# Patient Record
Sex: Male | Born: 1955 | Race: Black or African American | Hispanic: No | Marital: Single | Smoking: Former smoker
Health system: Southern US, Community
[De-identification: ages and names within clinical notes are randomized; demographics above are authoritative.]

## PROBLEM LIST (undated history)

## (undated) DIAGNOSIS — R569 Unspecified convulsions: Secondary | ICD-10-CM

## (undated) DIAGNOSIS — F79 Unspecified intellectual disabilities: Secondary | ICD-10-CM

## (undated) DIAGNOSIS — C801 Malignant (primary) neoplasm, unspecified: Secondary | ICD-10-CM

## (undated) DIAGNOSIS — E7800 Pure hypercholesterolemia, unspecified: Secondary | ICD-10-CM

---

## 1999-11-21 ENCOUNTER — Emergency Department (HOSPITAL_COMMUNITY): Admission: EM | Admit: 1999-11-21 | Discharge: 1999-11-22 | Payer: Self-pay | Admitting: Emergency Medicine

## 1999-11-21 ENCOUNTER — Encounter: Payer: Self-pay | Admitting: Emergency Medicine

## 2001-04-06 ENCOUNTER — Inpatient Hospital Stay (HOSPITAL_COMMUNITY): Admission: EM | Admit: 2001-04-06 | Discharge: 2001-04-09 | Payer: Self-pay | Admitting: *Deleted

## 2001-04-07 ENCOUNTER — Encounter: Payer: Self-pay | Admitting: Internal Medicine

## 2006-07-14 ENCOUNTER — Emergency Department (HOSPITAL_COMMUNITY): Admission: EM | Admit: 2006-07-14 | Discharge: 2006-07-14 | Payer: Self-pay | Admitting: Emergency Medicine

## 2006-07-25 ENCOUNTER — Emergency Department (HOSPITAL_COMMUNITY): Admission: EM | Admit: 2006-07-25 | Discharge: 2006-07-25 | Payer: Self-pay | Admitting: Family Medicine

## 2008-01-10 ENCOUNTER — Ambulatory Visit: Payer: Self-pay | Admitting: Family Medicine

## 2008-01-10 ENCOUNTER — Inpatient Hospital Stay (HOSPITAL_COMMUNITY): Admission: EM | Admit: 2008-01-10 | Discharge: 2008-01-16 | Payer: Self-pay | Admitting: Emergency Medicine

## 2008-01-30 ENCOUNTER — Ambulatory Visit: Payer: Self-pay | Admitting: Family Medicine

## 2008-01-30 DIAGNOSIS — R569 Unspecified convulsions: Secondary | ICD-10-CM | POA: Insufficient documentation

## 2008-01-30 DIAGNOSIS — F70 Mild intellectual disabilities: Secondary | ICD-10-CM | POA: Insufficient documentation

## 2008-01-30 DIAGNOSIS — Z87448 Personal history of other diseases of urinary system: Secondary | ICD-10-CM | POA: Insufficient documentation

## 2008-01-30 DIAGNOSIS — R972 Elevated prostate specific antigen [PSA]: Secondary | ICD-10-CM | POA: Insufficient documentation

## 2009-10-27 ENCOUNTER — Ambulatory Visit: Payer: Self-pay | Admitting: Family Medicine

## 2009-10-27 LAB — CONVERTED CEMR LAB
Glucose, Urine, Semiquant: NEGATIVE
Nitrite: NEGATIVE
Protein, U semiquant: NEGATIVE
WBC Urine, dipstick: NEGATIVE
pH: 8

## 2009-11-16 ENCOUNTER — Ambulatory Visit: Payer: Self-pay | Admitting: Family Medicine

## 2009-11-19 ENCOUNTER — Ambulatory Visit: Payer: Self-pay | Admitting: Family Medicine

## 2009-11-19 ENCOUNTER — Encounter: Payer: Self-pay | Admitting: Family Medicine

## 2009-11-19 LAB — CONVERTED CEMR LAB
ALT: 15 units/L (ref 0–53)
BUN: 15 mg/dL (ref 6–23)
CO2: 28 meq/L (ref 19–32)
Calcium: 9.6 mg/dL (ref 8.4–10.5)
Chloride: 109 meq/L (ref 96–112)
Creatinine, Ser: 0.79 mg/dL (ref 0.40–1.50)
Glucose, Bld: 93 mg/dL (ref 70–99)
HCT: 40.8 % (ref 39.0–52.0)
HDL: 68 mg/dL (ref >39)
Hemoglobin: 13.2 g/dL (ref 13.0–17.0)
LDL Cholesterol: 117 mg/dL — ABNORMAL HIGH (ref 0–99)
MCV: 101.5 fL — ABNORMAL HIGH (ref 78.0–100.0)
PSA: 2.96 ng/mL (ref 0.10–4.00)
RBC: 4.02 M/uL — ABNORMAL LOW (ref 4.22–5.81)
Total Bilirubin: 0.3 mg/dL (ref 0.3–1.2)
Total CHOL/HDL Ratio: 3
VLDL: 16 mg/dL (ref 0–40)
WBC: 5 10*3/uL (ref 4.0–10.5)

## 2009-12-30 ENCOUNTER — Encounter (INDEPENDENT_AMBULATORY_CARE_PROVIDER_SITE_OTHER): Payer: Self-pay | Admitting: *Deleted

## 2010-02-01 ENCOUNTER — Ambulatory Visit
Admission: RE | Admit: 2010-02-01 | Discharge: 2010-02-01 | Payer: Self-pay | Source: Home / Self Care | Attending: Gastroenterology | Admitting: Gastroenterology

## 2010-02-01 ENCOUNTER — Encounter (INDEPENDENT_AMBULATORY_CARE_PROVIDER_SITE_OTHER): Payer: Self-pay | Admitting: *Deleted

## 2010-02-03 ENCOUNTER — Encounter: Payer: Self-pay | Admitting: Family Medicine

## 2010-02-17 NOTE — Assessment & Plan Note (Signed)
Summary: cpe/eo   Vital Signs:  Patient profile:   55 year old male Height:      66 inches Weight:      137.5 pounds BMI:     22.27 Temp:     98.8 degrees F oral Pulse rate:   75 / minute BP sitting:   97 / 62  (left arm) Cuff size:   regular  Vitals Entered By: Garen Grams LPN (November 16, 2009 2:47 PM) CC: cpe Is Patient Diabetic? No Pain Assessment Patient in pain? no        Primary Care Provider:  Bobby Rumpf  MD  CC:  cpe.  History of Present Illness: 1) Elevated PSA: Patient was hospitalized 01/10/08 - 01/16/08 for acute pyelonephritis; received 10 day total course of antibiotics. Of note at that time patient was found to have elevated PSA to 37. Was supposed to follow up with me for recheck but did not do so. Denies dysuria or hematuria, increased frequency or urgency, or hesitancy. Usually wakes 1 - 2 per night to urinate.  Was seen 10/12 for "foul smelling urine" (this occurs with every 5-6 void, with normal UA.   2) Seizure disorder: stable at this time. Dr. Vickey Huger is neurologist. Last seizure was one year ago when ran out of medications. On Dilantin and Depakote as below.   3) Mental retardation: Here with his sister with whom he lives. His MR is mild per family and he is able to dress himself, microwave foods, bathe, feed self. Unable to read or write.   4) PRevention: Due for flu, due for colonoscopy, due for lipids   Habits & Providers  Alcohol-Tobacco-Diet     Tobacco Status: quit     Year Quit: 5 months  Current Medications (verified): 1)  Depakote 500 Mg Tbec (Divalproex Sodium) .Marland KitchenMarland KitchenMarland Kitchen 1500 Mg By Mouth Qday 2)  Dilantin 100 Mg Caps (Phenytoin Sodium Extended) .... 300 Mg By Mouth Qday  Allergies (verified): No Known Drug Allergies  Family History: Reviewed history from 01/30/2008 and no changes required. mother - deceased, 77's, pulmonary embolism father - unknown  2 sisters with HTN, DM (7 siblings total)  Social History: Reviewed history  from 01/30/2008 and no changes required. Lives with sister, brother-in-law and his sister's granddaughter. Smoked 1-2 packs per day since age 66, until 5 months aso, now smokes "a few cigarettes a week". No alcohol or drug use.   Physical Exam  General:  vitals reviewed.  thin.  comfortable appearing.  no acute distress. hard of hearing  Lungs:  Normal respiratory effort, chest expands symmetrically. Lungs are clear to auscultation, no crackles or wheezes. Heart:  Normal rate and regular rhythm. S1 and S2 normal without gallop, murmur, click, rub or other extra sounds. Abdomen:  soft, non-tender, normal bowel sounds, no distention, no masses, no guarding, and no rebound tenderness.     Impression & Recommendations:  Problem # 1:  ELEVATED PROSTATE SPECIFIC ANTIGEN (ICD-790.93) Patient with history of nocturia, occasional foul smelling urine. No other symptoms. Will recheck this week with other labs - Urology referral if elevated.   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 Form for Day Program for Adults to be filled.   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 Follow up with Neurology as per schedule.   His updated medication list for this problem includes:    Depakote  500 Mg Tbec (Divalproex sodium) .Marland KitchenMarland KitchenMarland KitchenMarland Kitchen 1500 mg by mouth qday    Dilantin 100 Mg Caps (Phenytoin sodium extended) .Marland KitchenMarland KitchenMarland KitchenMarland Kitchen 300 mg by mouth qday  Problem # 4:  Preventive Health Care (ICD-V70.0)  Lipids, CMET this week Flu vaccine today Self-referral for colonoscopy.   Orders: [facility] - Est  40-64 yrs ([address])  Complete Medication List: 1)  Depakote 500 Mg Tbec (Divalproex sodium) .Marland KitchenMarland KitchenMarland Kitchen 1500 mg by mouth qday 2)  Dilantin 100 Mg Caps (Phenytoin sodium extended) .... 300 mg by mouth qday  Other Orders: Flu Vaccine 68yrs + (60454) Admin 1st Vaccine (09811)  Patient  Instructions: 1)  Come back in this week to get fasting labs (come in the morning at 8:30 - DO NOT EAT BREAKFAST BEFORE YOU COME IN) 2)  Come back in one year for physical. 3)  Follow up as scheduled with Neurology.  4)  Make sure to get 8 glasses of water each day. 5)  Call the number to have a colonoscopy set up.    Orders Added: 1)  Flu Vaccine 39yrs + [90658] 2)  Admin 1st Vaccine [90471] 3)  [facility] - Est  40-64 yrs [99396]   Immunizations Administered:  Influenza Vaccine # 1:    Vaccine Type: Fluvax 3+    Site: right deltoid    Mfr: GlaxoSmithKline    Dose: 0.5 ml    Route: IM    Given by: Garen Grams LPN    Exp. Date: 07/13/2010    Lot #: [id number]    VIS given: 08/10/09 version given November 16, 2009.  Flu Vaccine Consent Questions:    Do you have a history of severe allergic reactions to this vaccine? no    Any prior history of allergic reactions to egg and/or gelatin? no    Do you have a sensitivity to the preservative Thimersol? no    Do you have a past history of Guillan-Barre Syndrome? no    Do you currently have an acute febrile illness? no    Have you ever had a severe reaction to latex? no    Vaccine information given and explained to patient? yes   Immunizations Administered:  Influenza Vaccine # 1:    Vaccine Type: Fluvax 3+    Site: right deltoid    Mfr: GlaxoSmithKline    Dose: 0.5 ml    Route: IM    Given by: Garen Grams LPN    Exp. Date: 07/13/2010    Lot #: [id number]    VIS given: 08/10/09 version given November 16, 2009.    Appended Document: Orders Update    Clinical Lists Changes  Orders: Added new Test order of Lipid-[facility] [phone]) - Signed Added new Test order of Comp Met-[facility] [phone]) - Signed Added new Test order of PSA-[facility] [phone]) - Signed Added new Test order of CBC-[facility] (25956) - Signed

## 2010-02-17 NOTE — Assessment & Plan Note (Signed)
Summary: NEEDS TO TALK ABOUT GETTING A COLONOSCOPY...LSW.   History of Present Illness Visit Type: Initial Visit Primary GI MD: Sheryn Bison MD FACP [facility] Primary Provider: Bobby Rumpf  MD Requesting Provider: n/a Chief Complaint: Patient here to discuss having a colonoscopy. He denies any current GI symptoms. History of Present Illness:   Pleasant 55 year old African American male who has mild mental retardation and is accompanied by his sister who is his power of attorney. He is followed by Dr. Wallene Huh and primary care, and has a chronic seizure disorder treated with Depakote and Dilantin. He is referred today for consideration of screening colonoscopy.  The patient and his sister deny any gastrointestinal symptomatology. Specifically no melena, hematochezia, abdominal pain, change in bowel movement pattern, dyspepsia, reflux symptoms, dysphagia, history of hepatitis or pancreatitis or any known liver disease. He follows a regular diet without any food intolerances, and denies weight loss or systemic complaints. He has been hospitalized previously with pyelonephritis. Family history is noncontributory. He has never had barium studies or screening endoscopic exams.   GI Review of Systems      Denies abdominal pain, acid reflux, belching, bloating, chest pain, dysphagia with liquids, dysphagia with solids, heartburn, loss of appetite, nausea, vomiting, vomiting blood, weight loss, and  weight gain.        Denies anal fissure, black tarry stools, change in bowel habit, constipation, diarrhea, diverticulosis, fecal incontinence, heme positive stool, hemorrhoids, irritable bowel syndrome, jaundice, light color stool, liver problems, rectal bleeding, and  rectal pain. Preventive Screening-Counseling & Management  Caffeine-Diet-Exercise     Does Patient Exercise: yes    Current Medications (verified): 1)  Depakote 500 Mg Tbec (Divalproex Sodium) .... Take 3 Tablets (1500 Mg) By Mouth Qday 2)   Dilantin 100 Mg Caps (Phenytoin Sodium Extended) .... Take 3 Tablets (300 Mg) By Mouth Qday 3)  Multivitamins  Tabs (Multiple Vitamin) .... Take 1 Tablet By Mouth Once A Day  Allergies (verified): No Known Drug Allergies  Past History:  Past medical, surgical, family and social histories (including risk factors) reviewed for relevance to current acute and chronic problems.  Past Medical History: Reviewed history from 01/30/2008 and no changes required. Seizure disorder - GTC  Mental retardation - mild per sister  hospitalization 12/09 for acute pyelonephritis elevated PSA (37) in 12/09   Past Surgical History: Reviewed history from 01/30/2008 and no changes required. none   Family History: Reviewed history from 01/30/2008 and no changes required. mother - deceased, 23's, pulmonary embolism father - unknown  2 sisters with HTN, DM (7 siblings total) Family History of Breast Cancer: Sister Family History of Diabetes: 2 sisters, 2 neices  Social History: Reviewed history from 01/30/2008 and no changes required. Lives with sister, brother-in-law and his sister's granddaughter. Smoked 1-2 packs per day since age 52, until 5 months aso, now smokes "a few cigarettes a week".  No alcohol or drug use.  Patient gets regular exercise. Does Patient Exercise:  yes  Review of Systems  The patient denies allergy/sinus, anemia, anxiety-new, arthritis/joint pain, back pain, blood in urine, breast changes/lumps, change in vision, confusion, cough, coughing up blood, depression-new, fainting, fatigue, fever, headaches-new, hearing problems, heart murmur, heart rhythm changes, itching, menstrual pain, muscle pains/cramps, night sweats, nosebleeds, pregnancy symptoms, shortness of breath, skin rash, sleeping problems, sore throat, swelling of feet/legs, swollen lymph glands, thirst - excessive , urination - excessive , urination changes/pain, urine leakage, vision changes, and voice change.     Vital Signs:  Patient profile:  55 year old male Height:      66 inches Weight:      135.13 pounds BMI:     21.89 BSA:     1.69 Pulse rate:   68 / minute Pulse rhythm:   regular BP sitting:   112 / 68  (left arm)  Vitals Entered By: Lamona Curl CMA Duncan Dull) (February 01, 2010 9:52 AM)  Physical Exam  General:  Well developed, well nourished, no acute distress.Very thin patient appearing or than his stated age in no acute distress. He is very pleasant, agreeable, and is able to converse without difficulty. Head:  Normocephalic and atraumatic. Eyes:  PERRLA, no icterus.exam deferred to patient's ophthalmologist.   Neck:  Supple; no masses or thyromegaly. Lungs:  Clear throughout to auscultation. Heart:  Regular rate and rhythm; no murmurs, rubs,  or bruits. Abdomen:  Soft, nontender and nondistended. No masses, hepatosplenomegaly or hernias noted. Normal bowel sounds. Rectal:  deferred until time of colonoscopy.   Msk:  Symmetrical with no gross deformities. Normal posture. Pulses:  Normal pulses noted. Extremities:  No clubbing, cyanosis, edema or deformities noted. Neurologic:  Alert and  oriented x4;  grossly normal neurologically. Cervical Nodes:  No significant cervical adenopathy. Psych:  Alert and cooperative. Normal mood and affect.   Impression & Recommendations:  Problem # 1:  SPECIAL SCREENING FOR MALIGNANT NEOPLASMS COLON (ICD-V76.51) Assessment Unchanged I have reviewed colonoscopy with its preparation, risk and benefits, and the patient and his sister have agreed to proceed as planned. We will prep him with a standard balanced electrolyte solution, early morning appointment, and I have suggested strongly to he and his sister that they continue his anti-seizure medication throughout the preparation. I also have reiterated the value of a good colonoscopy preparation in terms of colon cancer screening. 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  Patient Instructions: 1)  Copy sent to : Bobby Rumpf  MD 2)  Your procedure has been scheduled for 03/07/2010, please follow the seperate instructions.  3)  Your prescription(s) have been sent to you pharmacy.  4)  [facility] Patient Information Guide given to patient.  5)  Colonoscopy and Flexible Sigmoidoscopy brochure given.  6)  The medication list was reviewed and reconciled.  All changed / newly prescribed medications were explained.  A complete medication list was provided to the patient / caregiver. Prescriptions: MOVIPREP 100 GM  SOLR (PEG-KCL-NACL-NASULF-NA ASC-C) As per prep instructions.  #1 x 0   Entered by:   Harlow Mares CMA (AAMA)   Authorized by:   Mardella Layman MD [facility]   Signed by:   Harlow Mares CMA (AAMA) on 02/01/2010   Method used:   Electronically to        [facility] [address] [phone]* (retail)       [address]       [place], [place]  [address]       Ph: [phone] or [phone]       Fax: [phone]   RxID:   [id number]

## 2010-02-17 NOTE — Miscellaneous (Signed)
Summary: Physical form  pt dropped off physical form to be completed, gave to Terese Door for any clinical completion. Knox Royalty  February 03, 2010 1:38 PM  Physical form placed in Dr. Jeanice Lim box for completion.Terese Door  February 03, 2010 1:48 PM  Form completed - back to [employer]. Bobby Rumpf  MD  February 03, 2010 1:50 PM    Form faxed to [phone].  [name] Terese Door  February 03, 2010 3:26 PM

## 2010-02-17 NOTE — Assessment & Plan Note (Signed)
Summary: foul smelling urine/carew/bmc   Vital Signs:  Patient profile:   55 year old male Weight:      137.5 pounds Temp:     98.9 degrees F oral Pulse rate:   78 / minute BP sitting:   110 / 69  (left arm) Cuff size:   regular  Vitals Entered By: Garen Grams LPN (October 27, 2009 3:09 PM) CC: foul-smelling urine Is Patient Diabetic? No Pain Assessment Patient in pain? no        CC:  foul-smelling urine.  History of Present Illness: 1. Foul smelling urine.  Pt with MR.  Brought in by his sister who provided the HPI - Has had this for about 6 months - Not getting better or worse but is embarassing - After he urinates the whole side of the house will smell like urine - No new medicines or foods - Drinking plenty of water - It doesn't smell after every void but after every 5th or 6th  ROS: denies fevers, flank pain, dysuria, hematuria, dehydration, or vaginal discharge  PMHx: Hospitalized for pyelonephritis and prostatitis in 01/2009  Habits & Providers  Alcohol-Tobacco-Diet     Tobacco Status: quit     Year Quit: 5 months  Current Medications (verified): 1)  Depakote 500 Mg Tbec (Divalproex Sodium) .Marland KitchenMarland KitchenMarland Kitchen 1500 Mg By Mouth Qday 2)  Dilantin 100 Mg Caps (Phenytoin Sodium Extended) .... 300 Mg By Mouth Qday  Allergies: No Known Drug Allergies  Social History: Reviewed history from 01/30/2008 and no changes required. Lives with sister, brother-in-law and his sister's granddaughter. Smoked 1-2 packs per day since age 55, until 5 months aso, now smokes "a few cigarettes a week". No alcohol or drug use.   Physical Exam  General:  vitals reviewed.  thin.  comfortable appearing.  no acute distress Mouth:  poor dentition.   Lungs:  Normal respiratory effort, chest expands symmetrically. Lungs are clear to auscultation, no crackles or wheezes. Heart:  Normal rate and regular rhythm. S1 and S2 normal without gallop, murmur, click, rub or other extra sounds. Abdomen:   soft, non-tender, normal bowel sounds, no distention, no masses, no guarding, and no rebound tenderness.   Msk:  no flank pain or tenderness Extremities:  no lower extremity edema Neurologic:  alert, hard of hearing but conversant when directly addressed.    Impression & Recommendations:  Problem # 1:  OTHER ABNORMALITY OF URINATION (ZOX-096.04) Assessment New  Likely normal.  No associated symptoms like dysuria, change in color, hematuria.  UA normal.  Advised plenty of by mouth intake to help dilute the urine.  Orders: [facility]- Est Level  3 (540[address])  Complete Medication List: 1)  Depakote 500 Mg Tbec (Divalproex sodium) .Marland KitchenMarland KitchenMarland Kitchen 1500 mg by mouth qday 2)  Dilantin 100 Mg Caps (Phenytoin sodium extended) .... 300 mg by mouth qday  Other Orders: Urinalysis-[facility] (00000)  Patient Instructions: 1)  The urine study was normal, there was no sign of an infection 2)  Drink plenty of water 3)  Please schedule a follow up appointment with Dr. Wallene Huh  Laboratory Results   Urine Tests  Date/Time Received: October 27, 2009 3:15 PM  Date/Time Reported: October 27, 2009 4:10 PM   Routine Urinalysis   Color: yellow Appearance: Clear Glucose: negative   (Normal Range: Negative) Bilirubin: negative   (Normal Range: Negative) Ketone: negative   (Normal Range: Negative) Spec. Gravity: 1.020   (Normal Range: 1.003-1.035) Blood: negative   (Normal Range: Negative) pH: 8.0   (Normal Range:  5.0-8.0) Protein: negative   (Normal Range: Negative) Urobilinogen: 0.2   (Normal Range: 0-1) Nitrite: negative   (Normal Range: Negative) Leukocyte Esterace: negative   (Normal Range: Negative)    Comments: ...........test performed by...........Marland KitchenTerese Door, CMA

## 2010-02-17 NOTE — Letter (Signed)
Summary: New Patient letter  [facility]  [address] [place], [place] [address]   Phone: [phone]  Fax: [phone]       12/30/2009 MRN: [id number]   Jeffery [name] [address] [place], [place]  [address]  Dear [name],  Welcome to the Gastroenterology Division at [facility].    You are scheduled to see Dr.  Jarold Motto on 02/01/10 at 10:00 A.M.  on the 3rd floor at [facility], [address]  We ask that you try to arrive at our office 15 minutes prior to your appointment time to allow for check-in.  We would like you to complete the enclosed self-administered evaluation form prior to your visit and bring it with you on the day of your appointment.  We will review it with you.  Also, please bring a complete list of all your medications or, if you prefer, bring the medication bottles and we will list them.  Please bring your insurance card so that we may make a copy of it.  If your insurance requires a referral to see a specialist, please bring your referral form from your primary care physician.  Co-payments are due at the time of your visit and may be paid by cash, check or credit card.     Your office visit will consist of a consult with your physician (includes a physical exam), any laboratory testing he/she may order, scheduling of any necessary diagnostic testing (e.g. x-ray, ultrasound, CT-scan), and scheduling of a procedure (e.g. Endoscopy, Colonoscopy) if required.  Please allow enough time on your schedule to allow for any/all of these possibilities.    If you cannot keep your appointment, please call [phone] to cancel or reschedule prior to your appointment date.  This allows [place] the opportunity to schedule an appointment for another patient in need of care.  If you do not cancel or reschedule by 5 p.m. the business day prior to your appointment date, you will be charged a $50.00 late cancellation/no-show fee.    Thank you for  choosing  Gastroenterology for your medical needs.  We appreciate the opportunity to care for you.  Please visit [place] at our website  to learn more about our practice.                     Sincerely,                                                             The Gastroenterology Division

## 2010-02-17 NOTE — Letter (Signed)
Summary: [facility] Instructions  [facility]  [address] [place], [place] [address]   Phone: [phone]  Fax: [phone]       Jeffery [name]    30-Mar-1955    MRN: [id number]        Procedure Day /Date: Monday 03/07/2010     Arrival Time: 8am     Procedure Time: 9am     Location of Procedure:                    X  [facility] (4th Floor)                   PREPARATION FOR COLONOSCOPY WITH MOVIPREP   Starting 5 days prior to your procedure 03/02/2010 do not eat nuts, seeds, popcorn, corn, beans, peas,  salads, or any raw vegetables.  Do not take any fiber supplements (e.g. Metamucil, Citrucel, and Benefiber).  THE DAY BEFORE YOUR PROCEDURE        Sunday 03/06/2010  1.  Drink clear liquids the entire day-NO SOLID FOOD  2.  Do not drink anything colored red or purple.  Avoid juices with pulp.  No orange juice.  3.  Drink at least 64 oz. (8 glasses) of fluid/clear liquids during the day to prevent dehydration and help the prep work efficiently.  CLEAR LIQUIDS INCLUDE: Water Jello Ice Popsicles Tea (sugar ok, no milk/cream) Powdered fruit flavored drinks Coffee (sugar ok, no milk/cream) Gatorade Juice: apple, white grape, white cranberry  Lemonade Clear bullion, consomm, broth Carbonated beverages (any kind) Strained chicken noodle soup Hard Candy                             4.  In the morning, mix first dose of MoviPrep solution:    Empty 1 Pouch A and 1 Pouch B into the disposable container    Add lukewarm drinking water to the top line of the container. Mix to dissolve    Refrigerate (mixed solution should be used within 24 hrs)  5.  Begin drinking the prep at 5:00 p.m. The MoviPrep container is divided by 4 marks.   Every 15 minutes drink the solution down to the next mark (approximately 8 oz) until the full liter is complete.   6.  Follow completed prep with 16 oz of clear liquid of your choice (Nothing red or purple).  Continue  to drink clear liquids until bedtime.  7.  Before going to bed, mix second dose of MoviPrep solution:    Empty 1 Pouch A and 1 Pouch B into the disposable container    Add lukewarm drinking water to the top line of the container. Mix to dissolve    Refrigerate  THE DAY OF YOUR PROCEDURE      Monday 03/07/2010  Beginning at 4:00am (5 hours before procedure):         1. Every 15 minutes, drink the solution down to the next mark (approx 8 oz) until the full liter is complete.  2. Follow completed prep with 16 oz. of clear liquid of your choice.    3. You may drink clear liquids until 7am (2 HOURS BEFORE PROCEDURE).   MEDICATION INSTRUCTIONS   Remember to take ALL medication the morning of your procedure.         OTHER INSTRUCTIONS  You will need a responsible adult at least 57 years of age to accompany you and drive you  home.   This person must remain in the waiting room during your procedure.  Wear loose fitting clothing that is easily removed.  Leave jewelry and other valuables at home.  However, you may wish to bring a book to read or  an iPod/MP3 player to listen to music as you wait for your procedure to start.  Remove all body piercing jewelry and leave at home.  Total time from sign-in until discharge is approximately 2-3 hours.  You should go home directly after your procedure and rest.  You can resume normal activities the  day after your procedure.  The day of your procedure you should not:   Drive   Make legal decisions   Operate machinery   Drink alcohol   Return to work  You will receive specific instructions about eating, activities and medications before you leave.    The above instructions have been reviewed and explained to me by   _______________________    I fully understand and can verbalize these instructions _____________________________ Date _________

## 2010-03-07 ENCOUNTER — Encounter (INDEPENDENT_AMBULATORY_CARE_PROVIDER_SITE_OTHER): Payer: Self-pay | Admitting: *Deleted

## 2010-03-07 ENCOUNTER — Other Ambulatory Visit: Payer: Self-pay | Admitting: Gastroenterology

## 2010-03-07 ENCOUNTER — Encounter: Payer: Self-pay | Admitting: Gastroenterology

## 2010-03-07 ENCOUNTER — Other Ambulatory Visit: Payer: Medicaid Other

## 2010-03-07 ENCOUNTER — Other Ambulatory Visit (AMBULATORY_SURGERY_CENTER): Payer: Medicaid Other | Admitting: Gastroenterology

## 2010-03-07 DIAGNOSIS — Z1211 Encounter for screening for malignant neoplasm of colon: Secondary | ICD-10-CM

## 2010-03-07 DIAGNOSIS — D126 Benign neoplasm of colon, unspecified: Secondary | ICD-10-CM

## 2010-03-07 DIAGNOSIS — R972 Elevated prostate specific antigen [PSA]: Secondary | ICD-10-CM

## 2010-03-07 LAB — PSA: PSA: 4.55 ng/mL — ABNORMAL HIGH (ref 0.10–4.00)

## 2010-03-11 ENCOUNTER — Encounter: Payer: Self-pay | Admitting: Gastroenterology

## 2010-03-15 NOTE — Letter (Addendum)
Summary: Patient Notice- Polyp Results  [facility]  [address] [place], [place] [address]   Phone: [phone]  Fax: [phone]        March 11, 2010 MRN: [id number]    Jeffery [name] [address] [place], [place]  [address]    Dear [name],  I am pleased to inform you that the colon polyp(s) removed during your recent colonoscopy was (were) found to be benign (no cancer detected) upon pathologic examination.  I recommend you have a repeat colonoscopy examination in 5_ years to look for recurrent polyps, as having colon polyps increases your risk for having recurrent polyps or even colon cancer in the future.  Should you develop new or worsening symptoms of abdominal pain, bowel habit changes or bleeding from the rectum or bowels, please schedule an evaluation with either your primary care physician or with me.  Additional information/recommendations:  __ No further action with gastroenterology is needed at this time. Please      follow-up with your primary care physician for your other healthcare      needs.  __ Please call [phone] to schedule a return visit to review your      situation.  __ Please keep your follow-up visit as already scheduled.  x__ Continue treatment plan as outlined the day of your exam.  Please call us if you are having persistent problems or have questions about your condition that have not been fully answered at this time.  Sincerely,  Mardella Layman MD [facility]  This letter has been electronically signed by your physician.  Appended Document: Patient Notice- Polyp Results letter mailed

## 2010-03-15 NOTE — Procedures (Addendum)
Summary: Colonoscopy  Patient: Jeffery [name] Note: All result statuses are Final unless otherwise noted.  Tests: (1) Colonoscopy (COL)   COL Colonoscopy           DONE     [facility]     [address]     [place], [place]  [address]           COLONOSCOPY PROCEDURE REPORT           PATIENT:  [name], [name]  MR#:  [id number]     BIRTHDATE:  1955/11/28, 55 yrs. old  GENDER:  male     ENDOSCOPIST:  Vania Rea. Jarold Motto, MD, [facility]     REF. BY:     PROCEDURE DATE:  03/07/2010     PROCEDURE:  Colonoscopy with snare polypectomy     ASA CLASS:  Class II     INDICATIONS:  Routine Risk Screening     MEDICATIONS:   Fentanyl 50 mcg IV, Versed 5 mg IV           DESCRIPTION OF PROCEDURE:   After the risks benefits and     alternatives of the procedure were thoroughly explained, informed     consent was obtained.  Digital rectal exam was performed and     revealed no abnormalities and prostate nodule.  IRREGULAR ENLARGED     AND HARD PROSTATE. The LB CF-H180AL [id number] endoscope was     introduced through the anus and advanced to the cecum, which was     identified by both the appendix and ileocecal valve, without     limitations.  The quality of the prep was excellent, using     MoviPrep.  The instrument was then slowly withdrawn as the colon     was fully examined.     <<PROCEDUREIMAGES>>           FINDINGS:  A sessile polyp was found in the cecum. 7 MM FLAT POLYP     CECAL POLYP SNARE EXCISED WITH CAUTERY.  This was otherwise a     normal examination of the colon.   Retroflexed views in the rectum     revealed no abnormalities.    The scope was then withdrawn from     the patient and the procedure completed.           COMPLICATIONS:  None     ENDOSCOPIC IMPRESSION:     1) Sessile polyp in the cecum     2) Otherwise normal examination     PROSTATE ABNORMAL ALSO.     RECOMMENDATIONS:     1) Repeat colonoscopy in 5 years.     WE ORDERED PSA AND UROLOGY REFERRAL.     REPEAT EXAM:   No           ______________________________     Vania Rea. Jarold Motto, MD, Clementeen Graham           CC:  Bertrum Sol, MDKhary Wallene Huh, MD           n.     Rosalie DoctorMarland Kitchen   Vania Rea. Mariachristina Holle at 03/07/2010 10:01 AM           Weyman Pedro, [id number]  Note: An exclamation mark (!) indicates a result that was not dispersed into the flowsheet. Document Creation Date: 03/07/2010 10:01 AM _______________________________________________________________________  (1) Order result status: Final Collection or observation date-time: 03/07/2010 09:37 Requested date-time:  Receipt date-time:  Reported date-time:  Referring Physician:   Ordering Physician: Sheryn Bison [phone]) Specimen  Source:  Source: Launa Grill Order Number: [id number] Lab site:   Appended Document: Colonoscopy 5 year followup colonoscopy  Appended Document: Colonoscopy     Procedures Next Due Date:    Colonoscopy: 02/2015

## 2010-03-15 NOTE — Miscellaneous (Signed)
Summary: Orders Update  Clinical Lists Changes  Orders: Added new Test order of TLB-PSA (Prostate Specific Antigen) (84153-PSA) - Signed  Appended Document: Orders Update    Clinical Lists Changes  Orders: Added new Test order of [facility] - Signed

## 2010-03-16 ENCOUNTER — Telehealth: Payer: Self-pay | Admitting: Gastroenterology

## 2010-03-24 NOTE — Progress Notes (Signed)
Summary: fyi  Phone Note From Other Clinic Call back at [phone]   Caller: Dr. Dennison Nancy Kimbrough's office Call For: Dr Jarold Motto Summary of Call: Dr Valda Lamb office called and wanted to let Dr Jarold Motto know that he did not show up for his appt and did not reschedule. Initial call taken by: Tawni Levy,  March 16, 2010 12:18 PM  Follow-up for Phone Call        Left a message on patients machine to call back if he has questions but he did not show for his [facility] appt and he needs to call their office to reschedule.  Follow-up by: Harlow Mares CMA Duncan Dull),  March 16, 2010 12:54 PM

## 2010-05-31 NOTE — H&P (Signed)
NAME:  Jeffery [name], ERVINE NO.:  [id number]   MEDICAL RECORD NO.:  [id number]          PATIENT TYPE:  INP   LOCATION:  5528                         FACILITY:  [facility]   PHYSICIAN:  Leighton Roach McDiarmid, M.D.DATE OF BIRTH:  August 31, 1955   DATE OF ADMISSION:  01/10/2008  DATE OF DISCHARGE:                              HISTORY & PHYSICAL   PRIMARY CARE PHYSICIAN:  None, he is unassigned.   NEUROLOGIST:  Melvyn Novas, MD   CHIEF COMPLAINT:  Not acting normally per family.   HISTORY OF PRESENT ILLNESS:  This is a 55 year old African American male  with history of mild mental retardation and seizure disorder that was  brought to [facility] ED by his sister because he was not acting  normally.  She reports him to be more tired, decreased appetite,  confused, increased urinary frequency.  This started last night.  Denies  any recent illness.  He typically was able to perform ADLs.  No  complaints of dysuria or hematuria.  No documented fevers.  No recent  hospitalizations or nursing facilities.  No recent antibiotics.   ED stay, he is status post Tylenol 1 g p.o. x1, Rocephin 1 g IV x1,  normal saline 500 mL bolus x1.   PAST MEDICAL HISTORY:  1. Seizure disorder.  2. Mental retardation.   PAST SURGICAL HISTORY:  None.   MEDICATIONS:  1. Depakote 500 mg 3 tablets p.o. nightly.  2. Dilantin 100 mg 3 capsules p.o. nightly.  3. Multivitamin p.o. daily.   ALLERGIES:  No known drug allergies.   FAMILY HISTORY:  Sister has cancer.   SOCIAL HISTORY:  He lives with sister, Pollie Meyer and brother-in-law, and  great niece.  No alcohol or drug abuse.  Smokes tobacco occasionally.  He is not employed and has Medicaid.   REVIEW OF SYSTEMS:  No fevers, chills, or weight loss.  No chest pain,  shortness of breath, abdominal pain, nausea, vomiting, or diarrhea.  No  rashes.  No unilateral weakness or speech difficulties.   PHYSICAL EXAMINATION:  VITAL SIGNS:  T-max was 103.5 at  1716, T-current  is 101.1, pulse was 100s-110s, blood pressure 100-110/60s-70s,  respiratory rate 18, 95% on room air.  GENERAL:  He is non-ill appearing, difficulty hearing with no hearing  aid in place, thin African American male with no apparent distress.  HEENT:  Extraocular movements intact.  Pupils equal, round, and reactive  to light and accommodation.  No signs of trauma.  Moist mucous  membranes.  Dentures in place.  No lymphadenopathy.  CARDIOVASCULAR:  Regular rate and rhythm.  No murmurs, rubs, or gallops.  RESPIRATORY:  Clear to auscultation bilaterally.  No increased work of  breathing.  ABDOMEN:  Soft, nontender, nondistended.  No hepatosplenomegaly.  Positive bowel sounds.  No CVA tenderness.  EXTREMITIES:  No edema.  NEURO:  Cranial nerves II through XII grossly intact.  No facial droop.  No focal deficits.  Normal sensation.  SKIN:  No lesions, no rashes.  Positive dry skin.  PSYCH:  Alert and oriented to person and place, but not time,  no  distress.   LABORATORY DATA:  White blood cell count was 19.3, hemoglobin 13.3 and  platelets 170, 83% neutrophils.  Urinalysis showed small hemoglobin,  positive nitrites, moderate leuko esterase, 7-10 white blood cells, 0-2  red blood cells with many bacteria.  Dilantin level was 23.8, Depakote  was 54.3.  Sodium was 134, potassium 4, chloride 100, bicarb 22, BUN 18,  creatinine 1.07, glucose 131.  LFTs were all within normal limits.  Chest x-ray was normal.  Urine culture is pending and blood culture x2  is also pending.   ASSESSMENT AND PLAN:  This is a 55 year old male with acute urosepsis,  1. Urosepsis.  This is acute onset with confirmatory urinalysis and      white blood cell in history.  He is status post Rocephin 1 g IV x1.      We will plan to continue with Rocephin IV per pharmacy until urine      culture and no antibiotic therapy.  We are awaiting blood cultures      as well.  We will check a CBC and a PSA in the  morning concerning      that he may have some degree of obstruction per prostate that could      have caused his urinary tract infection.  We will work this up as      an outpatient.  We will continue to hydrate and treat fevers      accordingly with Tylenol.  2. Seizure disorder.  He is on good control at home with his home      medications.  No changes at this time, but we will continue with      medications.  3. Prophylaxis.  We will place him on heparin 5000 units subcu t.i.d.  4. Fluids, electrolytes, nutrition.  He will be on strict I's and O's,      regular diet, normal saline at 125 mL per hour and check a BMET in      the morning.  5. Disposition.  I will admit him to the [facility].  He is full code.  We are awaiting      cultures at this time.  His discharge will be pending his clinical      status.      Marisue Ivan, MD  Electronically Signed      Leighton Roach McDiarmid, M.D.  Electronically Signed    KL/MEDQ  D:  01/10/2008  T:  01/11/2008  Job:  [id number]

## 2010-05-31 NOTE — Discharge Summary (Signed)
NAME:  Jeffery [name], Jeffery [name] NO.:  [id number]   MEDICAL RECORD NO.:  [id number]          PATIENT TYPE:  INP   LOCATION:  5528                         FACILITY:  [facility]   PHYSICIAN:  Santiago Bumpers. Hensel, M.D.DATE OF BIRTH:  1955-07-11   DATE OF ADMISSION:  01/10/2008  DATE OF DISCHARGE:  01/16/2008                               DISCHARGE SUMMARY   DISCHARGE ATTENDING:  Chrissie Noa A. Hensel, MD   DISCHARGE DIAGNOSES:  1. Acute pyelonephritis.  2. Prostatitis with elevated prostate-specific antigen.  3. Seizure disorder.   DISCHARGE MEDICATIONS:  1. Ciprofloxacin 500 mg p.o. b.i.d. x8 days.  2. Depakote 1500 mg p.o. nightly.  3. Dilantin 300 mg p.o. nightly.  4. Multivitamin daily.   DISCONTINUED MEDICATIONS:  None.   CONSULTANTS:  None.   LABORATORY DATA:  White blood cells on admission 19.3, hemoglobin 13.3,  hematocrit 40.6, platelets 170, 83% neutrophils.  Electrolytes within  normal limits except for sodium of 134 and glucose of 131, total  bilirubin 1.4, albumin 3.2.  AST, ALT, alkaline phosphatase, total  protein within normal limits.  Phenytoin level slightly elevated at  23.8.  Valproic acid level within normal limits at 54.3.  UA notable for  small bilirubin, 15 ketones, small blood, 30 protein, positive nitrites,  moderate leukocyte esterase, many bacteria.  Urine culture notable for  1:100,000 colonies of Providencia sensitive to ceftriaxone,  ciprofloxacin, gentamicin, levofloxacin, tobramycin, trimethoprim-  sulfamethoxazole.  Blood cultures were negative x5 days on January 10, 2008 as well as January 13, 2008.  PSA was 37.08.  Discharge labs were  as follows, white blood cell 10.88, hemoglobin 11.7, hematocrit 35.3,  platelets 208.  Electrolytes within normal limits.   DIAGNOSTIC STUDIES:  Chest x-ray on January 10, 2008, no active  disease.  CT abdomen on January 15, 2008 showed potentially minimal  scarring of left kidney, lower pole.  Scar  versus small cyst in right  kidney upper pole.  No abscess or pyelonephritis currently apparent.  Mild bibasilar subsegmental atelectasis.  Contracted gallbladder likely  due to recent meal.  Paucity of intra-abdominal adipose tissue resulting  in obscuration of boundaries between adjacent structures in the abdomen.  Mild lumbar spondylosis and degenerative disk disease.  CT pelvis on  January 15, 2008 showed mild prominence of right side of prostate  gland.  Small pelvic lymph nodes are present, but not pathologically  enlarged.   BRIEF HOSPITAL COURSE:  This is a 55 year old male with past medical  history significant for mental retardation, seizure disorder who  presented with a complicated pyelonephritis, prostatitis.  1. Pyelonephritis.  The patient was initially started on ceftriaxone      and ampicillin transitioned to Cipro.  The patient continued to      spike fevers and abdominal and pelvic ultrasound.  Pelvic CT was      obtained to rule out abscess of which there was none.  The      patient's fever curve gradually improved and the patient was      afebrile on day of discharge times more than 24 hours.  The patient  did not have any complaints of dysuria or hematuria during the      course of his hospitalization.  2. Prostatitis with elevated PSA.  The patient's PSA elevated to 37 as      described above in lab section.  The patient with findings as      described above on pelvic CT.  The patient was started on Cipro for      a total of a 10-day course.  This issue should be followed up in      the outpatient setting with a followup PSA upon resolution of the      patient's symptoms.  Please follow this issue in the primary care      setting.  3. Seizure disorder.  The patient was without seizures during the      course of his hospitalization.  The patient's home medications were      continued.  This problem was stable for the patient.   DISCHARGE INSTRUCTIONS:  The  patient was given instructions to follow up  with Dr. Wallene Huh at [facility] on January 28, 2008  at 1:30 p.m., otherwise, no restrictions were made regarding the  patient.  Please followup on the patient's PSA in the outpatient  setting.   DISPOSITION:  The patient was discharged to home in stable and improved  condition.      Bobby Rumpf, MD  Electronically Signed      Santiago Bumpers. Leveda Anna, M.D.  Electronically Signed    KC/MEDQ  D:  01/20/2008  T:  01/20/2008  Job:  [id number]
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
## 2010-06-03 NOTE — Consult Note (Signed)
[place]  Patient:    Jeffery [name], Jeffery [name] Visit Number: [id number] MRN: [id number]          Service Type: MED Location: 3000 3033 01 Attending Physician:  Olene Craven Dictated by:   Melvyn Novas, M.D. Proc. Date: 04/06/01 Admit Date:  04/06/2001   CC:         Kern Reap, M.D.   Consultation Report  REASON FOR CONSULTATION:  Recurrent seizures.  HISTORY OF PRESENT ILLNESS:  The patient is a 55 year old African American male with a history of a seizure disorder and developmental delay.  He supposedly has had seizures since the early 1980s of unknown origin and was treated with Dilantin since.  The patient has been seen by Dr. Barbee Shropshire in the office and Dr. Barbee Shropshire noticed rapidly changing Dilantin blood levels.  Last night, a relative of the patients called the doctor twice to report that he had two generalized tonic-clonic seizures, then he had another one early in the morning.  When the patient arrived at [facility], he had two more generalized tonic-clonic events that were witnessed in the ER by house staff and nurses. The patient lives with his sister, who was there at the time of admission but is no longer available.  The patient received intravenous medication and remains somnolent at this point.  He is on telemetry.  PAST MEDICAL HISTORY:  Known is only a seizure disorder of unknown origin since the patient was in his mid-20s and a developmental delay of unknown origin and possible learning disability.  The chart does not mention hypertension, diabetes mellitus, hyperlipidemia, history of strokes, history of surgeries, history of psychiatric illnesses.  CURRENT MEDICATIONS:  All known is that the patient takes 300 mg of phenytoin twice a day.  ALLERGIES:  No known drug allergies.  PHYSICAL EXAMINATION:  GENERAL:  The patient has no facial or head injuries.  HEENT:  Eyes, ears, and nose are clear.  Nasopharynx is clear to  obstruction. There is no tongue bite.  The neck is supple.  The pupils react equal to light.  CARDIOVASCULAR:  Regular rate and rhythm in the heart.  No murmur.  No neck vein distention.  No carotid bruits.  No lip cyanosis.  All peripheral pulses are also present.  There is no edema, clubbing, cyanosis, or bruises.  ABDOMEN:  Soft, nontender, nondistended.  The patient is not obese.  EXTREMITIES:  All have a full range of motion spontaneously and to noxious stimuli and a normal muscle tone.  VITAL SIGNS:  The patient has a temperature of 105 degrees Fahrenheit, blood pressure 102/78, heart rate of 103, respiratory rate of 18 and shallow on nasal O2 at 2 L.  He saturates to 98%.  NEUROLOGIC:  Mental status:  The patient is obtunded, opens eyes to loud noise and does focus.  Cranial nerve gaze on moving objects four seconds, which is conjugate.  He can accommodate.  Pupils react equal to light.  Extraocular movements seem intact.  No evidence of visual field deficit.  No nystagmus, no gaze preference.  No facial weakness.  Retina is within normal limits.  There is no papilledema, ______, or effusion.  Motor examination:  No signs of myoclonus.  Regular tone, facial weakness.  All four extremities move to noxious stimuli spontaneously and equally.  He is able to lift his head and his hands off the bed.  He can sit without assistance.  Deep tendon reflexes are 1+ equally.  There is no Babinski.  Gait and stance were deferred due to the patients drowsiness.  Coordination is not possible to be tested due to the patients drowsiness.  Sensory to the noxious stimuli only.  LABORATORY DATA:  CT is within normal limits.  The patient shows no evidence of acute stroke or bleed.  Chest x-ray is within normal limits.  Chart review: The only chart available is a previous ER sheet from Dr. ______ over the last two years, which mentioned admissions for previously seizure disorder.  He had mentioned  a history of alcohol abuse until probably at least 10 years ago, which might have originated the seizure disorder at that time.  It also mentioned that the patient lives with his sister on disability.  MEDICATIONS:  The patient received 2 mg of Ativan IV in the ER and a 1 g Dilantin IV load.  ASSESSMENT: 1. Seizure disorder of unknown origin for 20 years and a history of alcohol    abuse.  The seizures are recurrent, intractable, and occur up to twice    monthly according to primary care records on phenytoin medication. 2. Subtherapeutic Dilantin level today, question of compliance, question of    adequate medication for the type of seizure. 3. The patient has an elevated temperature which might be postictal but we    have to rule out an infectious origin.  The patient has no signs of    meningism or encephalitis.  PLAN: 1. Electroencephalogram for evaluation of epilepsy 24 hours after the last    benzodiazepine dose.  We will continue intravenous medication in the form    of Dilantin and titration. 2. Blood cultures and urine cultures are pending to rule out an infection.    The patient has an elevated white blood cell count.  We will also rule out    a cardiac event. 3. ______ enzyme panel and electrocardiogram. 4. The patient was unable to take all the medication in the emergency room    today and will therefore receive intravenous medication phenytoin.  ADDENDUM:  Lab reports available now show the following:  White blood cell count 17.4, H&H 12.12/38.7, platelets 108.  Sodium 144, potassium 3.9, chloride 108, CO2 19, BUN 10, creatinine 1.2, glucose 105.  Calcium 8.6, magnesium 1.8.  Phenytoin level only 2.6!  I appreciate Dr. Thedore Mins consultation and will follow the patient over the weekend and the next week in the hospital if necessary, also after this discharge. Dictated by:   Melvyn Novas, M.D. Attending Physician:  Olene Craven DD:  04/06/01 TD:   04/08/01 Job: [id number]  [id number]

## 2010-10-20 LAB — CBC
HCT: 34.4 % — ABNORMAL LOW (ref 39.0–52.0)
HCT: 35.3 % — ABNORMAL LOW (ref 39.0–52.0)
HCT: 36.5 % — ABNORMAL LOW (ref 39.0–52.0)
Hemoglobin: 11.2 g/dL — ABNORMAL LOW (ref 13.0–17.0)
Hemoglobin: 11.5 g/dL — ABNORMAL LOW (ref 13.0–17.0)
Hemoglobin: 11.7 g/dL — ABNORMAL LOW (ref 13.0–17.0)
Hemoglobin: 12 g/dL — ABNORMAL LOW (ref 13.0–17.0)
Hemoglobin: 13.3 g/dL (ref 13.0–17.0)
MCHC: 32.8 g/dL (ref 30.0–36.0)
MCHC: 32.9 g/dL (ref 30.0–36.0)
MCV: 97.9 fL (ref 78.0–100.0)
MCV: 98 fL (ref 78.0–100.0)
MCV: 99 fL (ref 78.0–100.0)
Platelets: 208 10*3/uL (ref 150–400)
RBC: 3.47 MIL/uL — ABNORMAL LOW (ref 4.22–5.81)
RBC: 3.53 MIL/uL — ABNORMAL LOW (ref 4.22–5.81)
RBC: 3.69 MIL/uL — ABNORMAL LOW (ref 4.22–5.81)
RBC: 4.14 MIL/uL — ABNORMAL LOW (ref 4.22–5.81)
RDW: 13.8 % (ref 11.5–15.5)
RDW: 13.9 % (ref 11.5–15.5)
RDW: 14.2 % (ref 11.5–15.5)
WBC: 10 10*3/uL (ref 4.0–10.5)
WBC: 11 10*3/uL — ABNORMAL HIGH (ref 4.0–10.5)
WBC: 19.3 10*3/uL — ABNORMAL HIGH (ref 4.0–10.5)

## 2010-10-20 LAB — BASIC METABOLIC PANEL
BUN: 11 mg/dL (ref 6–23)
Calcium: 8 mg/dL — ABNORMAL LOW (ref 8.4–10.5)
Calcium: 8 mg/dL — ABNORMAL LOW (ref 8.4–10.5)
Chloride: 105 mEq/L (ref 96–112)
Creatinine, Ser: 0.95 mg/dL (ref 0.4–1.5)
GFR calc Af Amer: >60 mL/min (ref >60)
GFR calc Af Amer: >60 mL/min (ref >60)
GFR calc non Af Amer: >60 mL/min (ref >60)
GFR calc non Af Amer: >60 mL/min (ref >60)
GFR calc non Af Amer: >60 mL/min (ref >60)
Glucose, Bld: 98 mg/dL (ref 70–99)
Potassium: 3.6 mEq/L (ref 3.5–5.1)
Potassium: 4.1 mEq/L (ref 3.5–5.1)
Sodium: 139 mEq/L (ref 135–145)
Sodium: 139 mEq/L (ref 135–145)
Sodium: 139 mEq/L (ref 135–145)

## 2010-10-20 LAB — DIFFERENTIAL
Basophils Absolute: 0 10*3/uL (ref 0.0–0.1)
Basophils Absolute: 0 10*3/uL (ref 0.0–0.1)
Basophils Relative: 0 % (ref 0–1)
Eosinophils Absolute: 0 10*3/uL (ref 0.0–0.7)
Eosinophils Relative: 0 % (ref 0–5)
Lymphocytes Relative: 9 % — ABNORMAL LOW (ref 12–46)
Monocytes Absolute: 1.3 10*3/uL — ABNORMAL HIGH (ref 0.1–1.0)
Monocytes Relative: 9 % (ref 3–12)
Neutro Abs: 12.1 10*3/uL — ABNORMAL HIGH (ref 1.7–7.7)
Neutro Abs: 16 10*3/uL — ABNORMAL HIGH (ref 1.7–7.7)
Neutrophils Relative %: 82 % — ABNORMAL HIGH (ref 43–77)
Neutrophils Relative %: 83 % — ABNORMAL HIGH (ref 43–77)

## 2010-10-20 LAB — URINALYSIS, ROUTINE W REFLEX MICROSCOPIC
Ketones, ur: 15 mg/dL — AB
Protein, ur: 30 mg/dL — AB
Urobilinogen, UA: 1 mg/dL (ref 0.0–1.0)

## 2010-10-20 LAB — COMPREHENSIVE METABOLIC PANEL
Albumin: 3.2 g/dL — ABNORMAL LOW (ref 3.5–5.2)
Alkaline Phosphatase: 94 U/L (ref 39–117)
BUN: 18 mg/dL (ref 6–23)
Creatinine, Ser: 1.07 mg/dL (ref 0.4–1.5)
Glucose, Bld: 131 mg/dL — ABNORMAL HIGH (ref 70–99)
Potassium: 4 mEq/L (ref 3.5–5.1)
Total Bilirubin: 1.4 mg/dL — ABNORMAL HIGH (ref 0.3–1.2)
Total Protein: 6.4 g/dL (ref 6.0–8.3)

## 2010-10-20 LAB — CULTURE, BLOOD (ROUTINE X 2)
Culture: NO GROWTH
Culture: NO GROWTH

## 2010-10-20 LAB — URINE CULTURE: Colony Count: >=100000

## 2010-10-20 LAB — PHENYTOIN LEVEL, TOTAL: Phenytoin Lvl: 23.8 ug/mL — ABNORMAL HIGH (ref 10.0–20.0)

## 2011-01-20 DIAGNOSIS — R82998 Other abnormal findings in urine: Secondary | ICD-10-CM | POA: Diagnosis not present

## 2011-01-20 DIAGNOSIS — R972 Elevated prostate specific antigen [PSA]: Secondary | ICD-10-CM | POA: Diagnosis not present

## 2011-02-22 DIAGNOSIS — R82998 Other abnormal findings in urine: Secondary | ICD-10-CM | POA: Diagnosis not present

## 2011-02-22 DIAGNOSIS — D4959 Neoplasm of unspecified behavior of other genitourinary organ: Secondary | ICD-10-CM | POA: Diagnosis not present

## 2011-02-22 DIAGNOSIS — N390 Urinary tract infection, site not specified: Secondary | ICD-10-CM | POA: Diagnosis not present

## 2011-02-22 DIAGNOSIS — R972 Elevated prostate specific antigen [PSA]: Secondary | ICD-10-CM | POA: Diagnosis not present

## 2011-02-27 DIAGNOSIS — R82998 Other abnormal findings in urine: Secondary | ICD-10-CM | POA: Diagnosis not present

## 2011-02-27 DIAGNOSIS — R972 Elevated prostate specific antigen [PSA]: Secondary | ICD-10-CM | POA: Diagnosis not present

## 2011-03-08 ENCOUNTER — Other Ambulatory Visit: Payer: Self-pay | Admitting: Urology

## 2011-03-16 ENCOUNTER — Encounter (HOSPITAL_COMMUNITY): Payer: Self-pay | Admitting: Pharmacy Technician

## 2011-03-20 ENCOUNTER — Encounter (HOSPITAL_COMMUNITY): Payer: Self-pay

## 2011-03-20 ENCOUNTER — Other Ambulatory Visit: Payer: Self-pay

## 2011-03-20 ENCOUNTER — Encounter (HOSPITAL_COMMUNITY)
Admission: RE | Admit: 2011-03-20 | Discharge: 2011-03-20 | Disposition: A | Discharge status: Home / Self Care | Payer: Medicare Other | Source: Ambulatory Visit | Attending: Urology | Admitting: Urology

## 2011-03-20 DIAGNOSIS — Z01812 Encounter for preprocedural laboratory examination: Secondary | ICD-10-CM | POA: Diagnosis not present

## 2011-03-20 DIAGNOSIS — Z79899 Other long term (current) drug therapy: Secondary | ICD-10-CM | POA: Diagnosis not present

## 2011-03-20 DIAGNOSIS — F79 Unspecified intellectual disabilities: Secondary | ICD-10-CM | POA: Diagnosis not present

## 2011-03-20 DIAGNOSIS — R972 Elevated prostate specific antigen [PSA]: Secondary | ICD-10-CM | POA: Diagnosis not present

## 2011-03-20 DIAGNOSIS — G40909 Epilepsy, unspecified, not intractable, without status epilepticus: Secondary | ICD-10-CM | POA: Diagnosis not present

## 2011-03-20 HISTORY — DX: Unspecified convulsions: R56.9

## 2011-03-20 HISTORY — DX: Unspecified intellectual disabilities: F79

## 2011-03-20 LAB — CBC
HCT: 42.8 % (ref 39.0–52.0)
Hemoglobin: 14.2 g/dL (ref 13.0–17.0)
MCH: 31.9 pg (ref 26.0–34.0)
MCV: 96.2 fL (ref 78.0–100.0)
Platelets: 254 10*3/uL (ref 150–400)
RBC: 4.45 MIL/uL (ref 4.22–5.81)
WBC: 4.1 10*3/uL (ref 4.0–10.5)

## 2011-03-20 LAB — BASIC METABOLIC PANEL
Chloride: 104 mEq/L (ref 96–112)
GFR calc Af Amer: >90 mL/min (ref >90)
GFR calc non Af Amer: 89 mL/min — ABNORMAL LOW (ref >90)

## 2011-03-20 NOTE — Patient Instructions (Signed)
20 Jeffery [name]  03/20/2011   Your procedure is scheduled on: 03-27-11  Report to [facility] at     1130   AM.  Call this number if you have problems the morning of surgery: [phone]   Remember:   Do not eat food:After Midnight.  May have clear liquids:up to 6 Hours before arrival. Nothing after : 0800am  Clear liquids include soda, tea, black coffee, apple or grape juice, broth.  Take these medicines the morning of surgery with A SIP OF WATER: none   Do not wear jewelry, make-up or nail polish.  Do not wear lotions, powders, or perfumes. You may wear deodorant.  Do not shave 48 hours prior to surgery.(face and  neck)  Do not bring valuables to the hospital.  Contacts, dentures or bridgework may not be worn into surgery.  Leave suitcase in the car. After surgery it may be brought to your room.  For patients admitted to the hospital, checkout time is 11:00 AM the day of discharge.   Patients discharged the day of surgery will not be allowed to drive home.  Name and phone number of your driver: sister  Special Instructions: CHG Shower Use Special Wash: 1/2 bottle night before surgery and 1/2 bottle morning of surgery. (Avoid face and penis)   Please read over the following fact sheets that you were given: MRSA Information

## 2011-03-26 NOTE — H&P (Signed)
Chief Complaint  Elevated PSA   History of Present Illness        56 year old male with an elevate PSA to 37 in 2009.  He has been 4.55 in February 2012 and 6.47 in October 2012, and most recently 7.78 with 11% free in February 2013.  The patient and his care giver, his sister, have been counseled regarding options and they present today for the patient to have a transrectal ultrasound guided prostate biopsy in the OR.  We have discussed the risks, benefits, alternatives, and likelihood of achieving his goals    Past Medical History Problems  1. History of  Acute Bacterial Prostatitis 601.0 2. History of  Complete Colonoscopy For Polyp Removal 3. History of  Epilepsy And Recurrent Seizures 345.90 4. 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 History of  PSA,Elevated 790.93  Surgical History Problems  1. History of  No Surgical Problems  Current Meds 1. Depakote TBEC; Therapy: (Recorded:21Mar2012) to 2. Dilantin CAPS; Therapy: (Recorded:21Mar2012) to 3. Levofloxacin 500 MG Oral Tablet; TAKE 1 TABLET qAM; Therapy: 07Jan2013 to  (Evaluate:12Jan2013)  Requested for: 07Jan2013; Last Rx:07Jan2013 4. Multiple Vitamin TABS; Therapy: (Recorded:04Jan2013) to  Allergies Medication  1. No Known Drug Allergies  Family History Problems  1. Sororal history of  Breast Cancer V16.3 2. Maternal history of  Death In The Family Mother 4- PE 3. Sororal history of  Hyperthyroidism 4. Maternal history of  Pulmonary Embolism  Social History Problems  1. Former Smoker V15.82 smoked for 7 -8 yrs & quit in 2000 2. Marital History - Single 3. Tobacco Use 305.1 Denied  4. History of  Alcohol Use  Review of Systems Constitutional, skin, eye, otolaryngeal, hematologic/lymphatic, cardiovascular, pulmonary, endocrine, musculoskeletal, gastrointestinal, neurological and psychiatric system(s) were reviewed and pertinent findings if present are noted.     Physical Exam Constitutional: Well nourished and  well developed.  ENT:. The ears and nose are normal in appearance. The oropharynx is normal.  Neck: The appearance of the neck is normal and no neck mass is present.  Pulmonary: No respiratory distress and normal respiratory rhythm and effort.  Cardiovascular: Heart rate and rhythm are normal . No peripheral edema.  Abdomen: The abdomen is soft and nontender. The abdomen is no rebound. No CVA tenderness.  Lymphatics: The posterior cervical and supraclavicular nodes are not enlarged or tender.  Skin: Normal skin turgor and no visible rash.  Neuro/Psych:. Mood and affect are appropriate. No focal sensory deficits.     Assessment Elevated PSA  Plan Urine culture grew enterococcus sensitive to vancomycin and levaquin. He has been on a course of levaquin leading up to this biopsy.  Proceed with TRUS prostate biopsy in the OR.

## 2011-03-27 ENCOUNTER — Encounter (HOSPITAL_COMMUNITY)
Admission: RE | Disposition: A | Discharge status: Home / Self Care | Payer: Self-pay | Source: Ambulatory Visit | Attending: Urology

## 2011-03-27 ENCOUNTER — Encounter (HOSPITAL_COMMUNITY): Payer: Self-pay | Admitting: *Deleted

## 2011-03-27 ENCOUNTER — Ambulatory Visit (HOSPITAL_COMMUNITY)
Admission: RE | Admit: 2011-03-27 | Discharge: 2011-03-27 | Disposition: A | Discharge status: Home / Self Care | Payer: Medicare Other | Source: Ambulatory Visit | Attending: Urology | Admitting: Urology

## 2011-03-27 ENCOUNTER — Encounter (HOSPITAL_COMMUNITY): Payer: Self-pay | Admitting: Anesthesiology

## 2011-03-27 ENCOUNTER — Ambulatory Visit (HOSPITAL_COMMUNITY): Payer: Medicare Other | Admitting: Anesthesiology

## 2011-03-27 DIAGNOSIS — R972 Elevated prostate specific antigen [PSA]: Secondary | ICD-10-CM | POA: Insufficient documentation

## 2011-03-27 DIAGNOSIS — C61 Malignant neoplasm of prostate: Secondary | ICD-10-CM | POA: Diagnosis not present

## 2011-03-27 DIAGNOSIS — Z79899 Other long term (current) drug therapy: Secondary | ICD-10-CM | POA: Insufficient documentation

## 2011-03-27 DIAGNOSIS — F79 Unspecified intellectual disabilities: Secondary | ICD-10-CM | POA: Diagnosis not present

## 2011-03-27 DIAGNOSIS — G40909 Epilepsy, unspecified, not intractable, without status epilepticus: Secondary | ICD-10-CM | POA: Diagnosis not present

## 2011-03-27 DIAGNOSIS — Z01812 Encounter for preprocedural laboratory examination: Secondary | ICD-10-CM | POA: Insufficient documentation

## 2011-03-27 HISTORY — PX: PROSTATE BIOPSY: SHX241

## 2011-03-27 SURGERY — BIOPSY, PROSTATE, RECTAL APPROACH, WITH US GUIDANCE
Anesthesia: Monitor Anesthesia Care | Wound class: Dirty or Infected

## 2011-03-27 MED ORDER — FENTANYL CITRATE 0.05 MG/ML IJ SOLN: 25.0000 ug | INTRAMUSCULAR | Status: DC | PRN

## 2011-03-27 MED ORDER — PROPOFOL 10 MG/ML IV EMUL
INTRAVENOUS | Status: DC | PRN
  Administered 2011-03-27: 50 ug/kg/min via INTRAVENOUS

## 2011-03-27 MED ORDER — PROMETHAZINE HCL 25 MG/ML IJ SOLN: 6.2500 mg | INTRAMUSCULAR | Status: DC | PRN

## 2011-03-27 MED ORDER — VANCOMYCIN HCL IN DEXTROSE 1-5 GM/200ML-% IV SOLN
INTRAVENOUS | Status: AC
  Filled 2011-03-27: qty 200

## 2011-03-27 MED ORDER — BUPIVACAINE HCL 0.25 % IJ SOLN
INTRAMUSCULAR | Status: DC | PRN
  Administered 2011-03-27: 10 mL

## 2011-03-27 MED ORDER — BUPIVACAINE HCL (PF) 0.25 % IJ SOLN
INTRAMUSCULAR | Status: AC
  Filled 2011-03-27: qty 30

## 2011-03-27 MED ORDER — LACTATED RINGERS IV SOLN
INTRAVENOUS | Status: DC
  Administered 2011-03-27: 1000 mL via INTRAVENOUS
  Administered 2011-03-27: 15:00:00 via INTRAVENOUS

## 2011-03-27 MED ORDER — MIDAZOLAM HCL 5 MG/5ML IJ SOLN
INTRAMUSCULAR | Status: DC | PRN
  Administered 2011-03-27 (×2): 1 mg via INTRAVENOUS

## 2011-03-27 MED ORDER — VANCOMYCIN HCL IN DEXTROSE 1-5 GM/200ML-% IV SOLN
1000.0000 mg | INTRAVENOUS | Status: AC
  Administered 2011-03-27: 1000 mg via INTRAVENOUS

## 2011-03-27 MED ORDER — FENTANYL CITRATE 0.05 MG/ML IJ SOLN
INTRAMUSCULAR | Status: DC | PRN
  Administered 2011-03-27 (×2): 50 ug via INTRAVENOUS

## 2011-03-27 SURGICAL SUPPLY — 12 items
CONT SPEC 4OZ CLIKSEAL STRL BL (MISCELLANEOUS) ×1 IMPLANT
DRESSING TELFA 8X3 (GAUZE/BANDAGES/DRESSINGS) ×2 IMPLANT
GLOVE BIOGEL PI IND STRL 7.5 (GLOVE) IMPLANT
GLOVE BIOGEL PI INDICATOR 7.5 (GLOVE)
GLOVE ECLIPSE 7.0 STRL STRAW (GLOVE) IMPLANT
IV NS IRRIG 3000ML ARTHROMATIC (IV SOLUTION) IMPLANT
SYR CONTROL 10ML LL (SYRINGE) ×1 IMPLANT
TOWEL OR 17X26 10 PK STRL BLUE (TOWEL DISPOSABLE) ×2 IMPLANT
TRAY FOLEY CATH 16FRSI W/METER (SET/KITS/TRAYS/PACK) ×1 IMPLANT
UNDERPAD 30X30 INCONTINENT (UNDERPADS AND DIAPERS) ×2 IMPLANT
WATER STERILE IRR 3000ML UROMA (IV SOLUTION) IMPLANT
WATER STERILE IRR 500ML POUR (IV SOLUTION) ×2 IMPLANT

## 2011-03-27 NOTE — Anesthesia Postprocedure Evaluation (Signed)
  Anesthesia Post-op Note  Patient: Jeffery [name]  Procedure(s) Performed: Procedure(s) (LRB): BIOPSY TRANSRECTAL ULTRASONIC PROSTATE (TUBP) (N/A)  Patient Location: PACU  Anesthesia Type: MAC  Level of Consciousness: awake and alert   Airway and Oxygen Therapy: Patient Spontanous Breathing  Post-op Pain: mild  Post-op Assessment: Post-op Vital signs reviewed, Patient's Cardiovascular Status Stable, Respiratory Function Stable, Patent Airway and No signs of Nausea or vomiting  Post-op Vital Signs: stable  Complications: No apparent anesthesia complications

## 2011-03-27 NOTE — Transfer of Care (Signed)
Immediate Anesthesia Transfer of Care Note  Patient: Jeffery [name]  Procedure(s) Performed: Procedure(s) (LRB): BIOPSY TRANSRECTAL ULTRASONIC PROSTATE (TUBP) (N/A)  Patient Location: PACU  Anesthesia Type: MAC  Level of Consciousness: awake and alert   Airway & Oxygen Therapy: Patient Spontanous Breathing and Patient connected to face mask oxygen  Post-op Assessment: Report given to PACU RN and Post -op Vital signs reviewed and stable  Post vital signs: Reviewed and stable  Complications: No apparent anesthesia complications

## 2011-03-27 NOTE — Discharge Instructions (Signed)
DISCHARGE INSTRUCTIONS FOR PROSTATE BIOPSY  MEDICATIONS:   1. DO NOT RESUME YOUR ANY BLOOD THINNERS FOR ONE WEEK.  2. Resume all your other meds from home.   ACTIVITY 1. No driving while on narcotic pain medications 2. Drink plenty of water   BATHING 1. You can shower or take a bath.  SIGNS/SYMPTOMS TO CALL: 1. Please call us if you have a fever greater than 101.5, uncontrolled  nausea/vomiting, uncontrolled pain, dizziness, unable to urinate,   chest pain, shortness of breath, leg swelling, leg pain, or any other concerns  or questions.  You can reach [place] at [phone].  FOLLOW-UP 1.  I will contact you with the results of the biopsy and schedule follow up based upon that.

## 2011-03-27 NOTE — Brief Op Note (Signed)
03/27/2011  1:57 PM  PATIENT:  Jeffery [name]  56 y.o. male  PRE-OPERATIVE DIAGNOSIS:  Elevated Prostate Specific Antigen  POST-OPERATIVE DIAGNOSIS:  Elevated Prostate Specific Antigen  PROCEDURE:  Procedure(s) (LRB): BIOPSY PROSTATE  Transrectal ultrasound of the prostate Prostatic block bilaterally  SURGEON:  Surgeon(s) and Role:    * Milford Cage, MD - Primary  PHYSICIAN ASSISTANT:   ASSISTANTS: none   ANESTHESIA:   local and IV sedation  EBL:  Total I/O In: -  Out: 500 [Urine:500]  BLOOD ADMINISTERED:none  DRAINS: Urinary Catheter (Foley) - removed in OR.  LOCAL MEDICATIONS USED:  MARCAINE    and Amount: 10 ml  SPECIMEN:  Source of Specimen:  prostate  DISPOSITION OF SPECIMEN:  PATHOLOGY  COUNTS:  YES  TOURNIQUET:  * No tourniquets in log *  DICTATION: .Other Dictation: Dictation Number [id number]  PLAN OF CARE: Discharge to home after PACU  PATIENT DISPOSITION:  PACU - hemodynamically stable.   Delay start of Pharmacological VTE agent (>24hrs) due to surgical blood loss or risk of bleeding: yes

## 2011-03-27 NOTE — Anesthesia Preprocedure Evaluation (Signed)
Anesthesia Evaluation  Patient identified by MRN, date of birth, ID band Patient awake  General Assessment Comment:Pt with MR  Reviewed: Allergy & Precautions, H&P , NPO status , Patient's Chart, lab work & pertinent test results  Airway Mallampati: II TM Distance: >3 FB Neck ROM: Full    Dental No notable dental hx.    Pulmonary neg pulmonary ROS,  breath sounds clear to auscultation  Pulmonary exam normal       Cardiovascular negative cardio ROS  Rhythm:Regular Rate:Normal     Neuro/Psych Seizures -, Well Controlled,  negative neurological ROS  negative psych ROS   GI/Hepatic negative GI ROS, Neg liver ROS,   Endo/Other  negative endocrine ROS  Renal/GU negative Renal ROS  negative genitourinary   Musculoskeletal negative musculoskeletal ROS (+)   Abdominal   Peds negative pediatric ROS (+)  Hematology negative hematology ROS (+)   Anesthesia Other Findings   Reproductive/Obstetrics negative OB ROS                           Anesthesia Physical Anesthesia Plan  ASA: II  Anesthesia Plan: MAC   Post-op Pain Management:    Induction: Intravenous  Airway Management Planned: Simple Face Mask  Additional Equipment:   Intra-op Plan:   Post-operative Plan:   Informed Consent: I have reviewed the patients History and Physical, chart, labs and discussed the procedure including the risks, benefits and alternatives for the proposed anesthesia with the patient or authorized representative who has indicated his/her understanding and acceptance.   Dental advisory given  Plan Discussed with: CRNA  Anesthesia Plan Comments:         Anesthesia Quick Evaluation

## 2011-03-28 ENCOUNTER — Encounter (HOSPITAL_COMMUNITY): Payer: Self-pay | Admitting: Urology

## 2011-03-28 NOTE — Op Note (Signed)
NAME:  Jeffery [name], Jeffery [name] NO.:  [id number]  MEDICAL RECORD NO.:  [id number]  LOCATION:  [facility]                         FACILITY:  [facility]  PHYSICIAN:  Natalia Leatherwood, MD    DATE OF BIRTH:  08/15/1955  DATE OF PROCEDURE:  03/27/2011 DATE OF DISCHARGE:  03/27/2011                              OPERATIVE REPORT   PROCEDURES PERFORMED: 1. Transrectal ultrasound of the prostate. 2. Prostate biopsy. 3. Bilateral prostatic block.  SURGEON: Natalia Leatherwood, MD  ASSISTANT:  None.  PREOPERATIVE DIAGNOSIS:  Elevated prostate-specific antigen.  POSTOPERATIVE DIAGNOSIS:  Elevated prostate-specific antigen.  COMPLICATIONS:  None.  ESTIMATED BLOOD LOSS:  Minimal.  SPECIMEN SENT:  Prostate biopsies, 6 biopsies bilaterally (6 on each side for a total of 12).  COMPLICATION:  None.  FINDINGS:  Calcifications in the prostate.  DRAINS:  Foley catheter placed in the operating room, and removed in the operating room to drain the bladder.  HISTORY OF PRESENT ILLNESS:  A 56 year old male who presented with elevated PSA.  PSA remains high after repeating PSA and it is recommended he have a prostate biopsy.  He presented today for that procedure.  DESCRIPTION OF PROCEDURE:  After informed consent was obtained, the patient was taken to the operating room, where he was placed in supine position.  IV antibiotics were infused and IV sedation was administered. After this was done, he was left on the gurney and placed in a lateral decubitus position with his right side up.  Following this, a transrectal ultrasound probe was placed through the rectum and the bladder and prostate were visualized.  Prostate block was carried out bilaterally by injecting at the junction of the seminal vesicle with the prostate bilaterally.  A total of 10 cc of 0.25% plain Marcaine were injected.  After this was completed, biopsies carried out with 6 biopsies on the left and 6 biopsies on the right in  the standard fashion.  It was noted that there were some calcifications in the prostate.  No suspicious masses.  Prostate measured 5.93 cm in length, 3.83 cm in height, 5.52 cm in width.  Overall prostate volume was 65.68 mL.  As noted, the patient's bladder was very full at the time of biopsy and after the biopsy was completed, he was placed back in supine position, and a 16-French catheter was placed using sterile technique.  After his bladder was completely drained, the catheter was removed in the operating room.  He was taken to the PACU in stable condition.  I will contact him with the results of his biopsy and we will make followup based upon this.          ______________________________ Natalia Leatherwood, MD     DW/MEDQ  D:  03/27/2011  T:  03/28/2011  Job:  [id number]

## 2011-04-27 DIAGNOSIS — C61 Malignant neoplasm of prostate: Secondary | ICD-10-CM | POA: Diagnosis not present

## 2011-08-17 ENCOUNTER — Telehealth: Payer: Self-pay | Admitting: *Deleted

## 2011-08-17 ENCOUNTER — Encounter: Payer: Self-pay | Admitting: Family Medicine

## 2011-08-17 ENCOUNTER — Ambulatory Visit (INDEPENDENT_AMBULATORY_CARE_PROVIDER_SITE_OTHER): Payer: Medicare Other | Admitting: Family Medicine

## 2011-08-17 VITALS — BP 114/80 | HR 80 | Ht 66.0 in | Wt 137.0 lb

## 2011-08-17 DIAGNOSIS — R972 Elevated prostate specific antigen [PSA]: Secondary | ICD-10-CM

## 2011-08-17 DIAGNOSIS — F70 Mild intellectual disabilities: Secondary | ICD-10-CM

## 2011-08-17 DIAGNOSIS — H6120 Impacted cerumen, unspecified ear: Secondary | ICD-10-CM | POA: Diagnosis not present

## 2011-08-17 DIAGNOSIS — R569 Unspecified convulsions: Secondary | ICD-10-CM

## 2011-08-17 DIAGNOSIS — F17200 Nicotine dependence, unspecified, uncomplicated: Secondary | ICD-10-CM | POA: Diagnosis not present

## 2011-08-17 DIAGNOSIS — H9190 Unspecified hearing loss, unspecified ear: Secondary | ICD-10-CM | POA: Diagnosis not present

## 2011-08-17 DIAGNOSIS — Z720 Tobacco use: Secondary | ICD-10-CM | POA: Insufficient documentation

## 2011-08-17 DIAGNOSIS — E7800 Pure hypercholesterolemia, unspecified: Secondary | ICD-10-CM | POA: Diagnosis not present

## 2011-08-17 LAB — COMPREHENSIVE METABOLIC PANEL
Albumin: 4.4 g/dL (ref 3.5–5.2)
Alkaline Phosphatase: 95 U/L (ref 39–117)
BUN: 22 mg/dL (ref 6–23)
Calcium: 9.7 mg/dL (ref 8.4–10.5)
Chloride: 105 mEq/L (ref 96–112)
Glucose, Bld: 88 mg/dL (ref 70–99)
Potassium: 4.4 mEq/L (ref 3.5–5.3)
Sodium: 142 mEq/L (ref 135–145)
Total Protein: 6.7 g/dL (ref 6.0–8.3)

## 2011-08-17 LAB — LIPID PANEL
HDL: 80 mg/dL (ref >39)
LDL Cholesterol: 137 mg/dL — ABNORMAL HIGH (ref 0–99)
Triglycerides: 66 mg/dL (ref <150)

## 2011-08-17 NOTE — Assessment & Plan Note (Signed)
3 cig per month when agitated. Will be difficult to get pt to completely quit due to mental state. Family to limit as much as possible

## 2011-08-17 NOTE — Patient Instructions (Addendum)
Thank you for coming in today.  The ENT office will call you for further hearing testing. Please continue to follow up with urology and neurology I will let you know if any of his blood work comes back abnormal Have a great day

## 2011-08-17 NOTE — Assessment & Plan Note (Signed)
Cared for by sister and brother in Social worker. Well cared for.

## 2011-08-17 NOTE — Progress Notes (Signed)
  Subjective:    Patient ID: Jeffery [name], male    DOB: [birth year], 56 y.o.   MRN: [id number]  HPI CC: Hearing loss and physical  Hearing loss: Pt w/ hearing difficulty for ~15 years. Caregivers (sister and brother in law) have tried several cheaper hearing aids w/o much success. These items are large and are easily destroyed by pt. Struggles to hear daily conversation. Does not respond unless spoken to directly and loudly. Denies any balance issues, dizzyness, syncope, falls, HA.  Seizures: H/o Seizures. Has not had a seizure since January. On Depakote and Dilantin. Followed by Neurology who manage his levels and szr activity  Elevated PSA: Followed actively by urologist,. Dr Margarita Grizzle. Initial workup for recurrent UTI. US guided biopsy on 03/27/11 and path pending.  Reviewed Dr. Hilario Quarry notes w/ pt and family. Denies dysuria, frequency, urgency, abd pain  Mental disability: Brother in Social worker w/ pt today unsure of cause. Pt was not born w/ disability. Pt lives w/ brother in law and sister. Able to perform ADLs. Conversatinal and interactive. No recent digression of mental status  Tobacco abuse: Occasionally will "sneak" a cigarette. Approximately 3per month. Long time smoker. Denies SOB, wt loss, cough, fever  Past medical, Past surgical, Past family, and Social histories reviewed.   Review of Systems Per HPI    Objective:   Physical Exam Gen: NAD, thin HEENT: Dentures, oropharynx clear, TM obstructed by cerumen, normal after washout CV: rrr, no m/r/g Res: CTAB, normal effort Musc: normal ROM, no effutions Ext: 2+ pulses, no edema Neuro: PERRL, CN grossly intact, gait normal. Pxych: follows commands, limited vocabulary       Assessment & Plan:   Lipid panel today

## 2011-08-17 NOTE — Assessment & Plan Note (Addendum)
Numerous hearing aids in past w/ benefit. Ears cleaned today. Hearing test equivocal due to mental disability, but continues to ask for clarification and to speak up when spoken too. Will likely benefit from hearing aid, but will need further testing. Refer to ENT for audiology testing

## 2011-08-17 NOTE — Telephone Encounter (Signed)
Wife called back to let us know that he had brought in paperwork that gave info on a clinic that does free testing and hearing aids.  That's what he needed a referral for.

## 2011-08-17 NOTE — Telephone Encounter (Signed)
Left message for patient to return call. Please tell Jeffery [name] he has an appointment with [facility] on August 24, 2011 at 2:40. He will need to arrive 15 minutes earlier and bring a list of medications and co-pay. If unable to keep this appointment please call [phone] to reschedule.Busick, [name]

## 2011-08-17 NOTE — Assessment & Plan Note (Signed)
F/u w/ urology. No intervention at this time. No urinary complaints

## 2011-08-17 NOTE — Telephone Encounter (Signed)
Patient's sister returned call and says she is not at home and Mr Frett has other appointments in August and is not sure if they will coincide with the ENT appointment. She is supposed to call back tomorrow for appointment information

## 2011-08-17 NOTE — Assessment & Plan Note (Signed)
Followed by Neuro. They are managing his meds. No seizure since January. Compliant w/ medicaitons

## 2011-08-17 NOTE — Telephone Encounter (Signed)
Left message for patient to return call. Please ask her for the name and number of the clinic he wishes to be referred to.Busick, Jeffery [name]

## 2011-08-21 NOTE — Telephone Encounter (Signed)
Patient informed of appointment.Jeffery [name], [name]

## 2011-08-24 DIAGNOSIS — C61 Malignant neoplasm of prostate: Secondary | ICD-10-CM | POA: Diagnosis not present

## 2011-08-25 DIAGNOSIS — H905 Unspecified sensorineural hearing loss: Secondary | ICD-10-CM | POA: Diagnosis not present

## 2011-08-31 DIAGNOSIS — R82998 Other abnormal findings in urine: Secondary | ICD-10-CM | POA: Diagnosis not present

## 2011-08-31 DIAGNOSIS — C61 Malignant neoplasm of prostate: Secondary | ICD-10-CM | POA: Diagnosis not present

## 2011-09-01 ENCOUNTER — Other Ambulatory Visit: Payer: Self-pay | Admitting: Urology

## 2011-09-02 MED ORDER — FLEET ENEMA 7-19 GM/118ML RE ENEM: 1.0000 | ENEMA | Freq: Once | RECTAL | Status: DC

## 2011-09-02 MED ORDER — DEXTROSE 5 % IV SOLN: 160.0000 mg | Freq: Once | INTRAVENOUS | Status: DC

## 2011-09-18 ENCOUNTER — Encounter: Payer: Self-pay | Admitting: Family Medicine

## 2011-09-18 ENCOUNTER — Telehealth: Payer: Self-pay | Admitting: Family Medicine

## 2011-09-18 DIAGNOSIS — M4120 Other idiopathic scoliosis, site unspecified: Secondary | ICD-10-CM | POA: Diagnosis not present

## 2011-09-18 DIAGNOSIS — E7800 Pure hypercholesterolemia, unspecified: Secondary | ICD-10-CM

## 2011-09-18 DIAGNOSIS — M62830 Muscle spasm of back: Secondary | ICD-10-CM

## 2011-09-18 DIAGNOSIS — R079 Chest pain, unspecified: Secondary | ICD-10-CM | POA: Diagnosis not present

## 2011-09-18 DIAGNOSIS — S298XXA Other specified injuries of thorax, initial encounter: Secondary | ICD-10-CM | POA: Diagnosis not present

## 2011-09-18 DIAGNOSIS — S20219A Contusion of unspecified front wall of thorax, initial encounter: Secondary | ICD-10-CM | POA: Diagnosis not present

## 2011-09-18 NOTE — Assessment & Plan Note (Addendum)
Elevated total cholesterol and LDL. Start pravastatin 40. Recheck in 6 mo.

## 2011-09-19 ENCOUNTER — Encounter: Payer: Self-pay | Admitting: Family Medicine

## 2011-09-19 ENCOUNTER — Encounter (HOSPITAL_COMMUNITY): Payer: Self-pay | Admitting: Pharmacy Technician

## 2011-09-19 DIAGNOSIS — M62830 Muscle spasm of back: Secondary | ICD-10-CM | POA: Insufficient documentation

## 2011-09-19 MED ORDER — PRAVASTATIN SODIUM 40 MG PO TABS: 40.0000 mg | ORAL_TABLET | Freq: Every day | ORAL | Status: DC

## 2011-09-19 MED ORDER — CYCLOBENZAPRINE HCL 5 MG PO TABS: 5.0000 mg | ORAL_TABLET | Freq: Three times a day (TID) | ORAL | Status: DC | PRN

## 2011-09-19 NOTE — Assessment & Plan Note (Signed)
Flexeril 5mg  one time Rx. No refills

## 2011-09-19 NOTE — Telephone Encounter (Signed)
Called and spoke to brother in law who is pt caretaker. Discussed risks benefits of starting statin and mm relaxer.

## 2011-09-25 ENCOUNTER — Encounter (HOSPITAL_COMMUNITY): Payer: Self-pay

## 2011-09-25 ENCOUNTER — Encounter (HOSPITAL_COMMUNITY)
Admission: RE | Admit: 2011-09-25 | Discharge: 2011-09-25 | Disposition: A | Discharge status: Home / Self Care | Payer: Medicare Other | Source: Ambulatory Visit | Attending: Urology | Admitting: Urology

## 2011-09-25 DIAGNOSIS — N390 Urinary tract infection, site not specified: Secondary | ICD-10-CM | POA: Diagnosis not present

## 2011-09-25 DIAGNOSIS — C61 Malignant neoplasm of prostate: Secondary | ICD-10-CM | POA: Diagnosis not present

## 2011-09-25 DIAGNOSIS — Z01812 Encounter for preprocedural laboratory examination: Secondary | ICD-10-CM | POA: Diagnosis not present

## 2011-09-25 HISTORY — DX: Malignant (primary) neoplasm, unspecified: C80.1

## 2011-09-25 LAB — SURGICAL PCR SCREEN: Staphylococcus aureus: NEGATIVE

## 2011-09-25 LAB — BASIC METABOLIC PANEL
BUN: 18 mg/dL (ref 6–23)
Chloride: 106 mEq/L (ref 96–112)
GFR calc Af Amer: >90 mL/min (ref >90)
GFR calc non Af Amer: >90 mL/min (ref >90)
Glucose, Bld: 79 mg/dL (ref 70–99)
Potassium: 4.4 mEq/L (ref 3.5–5.1)
Sodium: 143 mEq/L (ref 135–145)

## 2011-09-25 LAB — CBC
HCT: 40.7 % (ref 39.0–52.0)
Hemoglobin: 13.7 g/dL (ref 13.0–17.0)
MCHC: 33.7 g/dL (ref 30.0–36.0)
WBC: 4.4 10*3/uL (ref 4.0–10.5)

## 2011-09-25 NOTE — Pre-Procedure Instructions (Signed)
PT IS MENTALLY CHALLANGED.  HE WAS NOT ABLE TO EXPRESS WHAT HIS SURGERY PROCEDURE IS.  HIS BROTHER IN LAW Mardy COLE IS WITH HIM--PT LIVES WITH HIS SISTER [name] COLE AND BROTHER IN LAW Kathryn COLE.  MR. COLE STATES NO ONE IN THE FAMILY HAS POA OR GUARDIANSHIP-STATE DOES NOT HAVE GUARDIANSHIP.  MR. COLE SIGNED SURGICAL CONSENT AND REVIEWED PT'S MEDICAL HX WITH ME. CBC, BMET WERE DONE TODAY -PREOP AT [facility].  PT HAS EKG REPORT IN EPIC FROM 03/20/11. PREOP INSTRUCTIONS DISCUSSED WITH Tonie COLE USING TEACH BACK METHOD.

## 2011-09-25 NOTE — Patient Instructions (Signed)
YOUR SURGERY IS SCHEDULED ON:   WED 9/11  AT 2:00 PM  REPORT TO [facility] AT:  12:00 PM      PHONE # FOR SHORT STAY IS [phone]  DO NOT EAT ANYTHING AFTER MIDNIGHT THE NIGHT BEFORE YOUR SURGERY.  NO FOOD, NO CHEWING GUM, NO MINTS, NO CANDIES, NO CHEWING TOBACCO. MAY HAVE CLEAR LIQUIDS TO DRINK FROM MIDNIGHT UNTIL 8:00 AM DAY OF SURGERY --LIKE WATER OR APPLE JUICE OR GRAPE JUICE OR FLAVORED WATER.   NOTHING TO DRINK AFTER 8:00 AM THE DAY OF SURGERY.  PLEASE TAKE THE FOLLOWING MEDICATIONS THE AM OF YOUR SURGERY WITH A FEW SIPS OF WATER:  NO MEDICINES TO TAKE AM OF SURGERY.     DO NOT BRING VALUABLES, MONEY, CREDIT CARDS.  CONTACT LENS, DENTURES / PARTIALS, GLASSES SHOULD NOT BE WORN TO SURGERY AND IN MOST CASES-HEARING AIDS WILL NEED TO BE REMOVED.  BRING YOUR GLASSES CASE, ANY EQUIPMENT NEEDED FOR YOUR CONTACT LENS. FOR PATIENTS ADMITTED TO THE HOSPITAL--CHECK OUT TIME THE DAY OF DISCHARGE IS 11:00 AM.  ALL INPATIENT ROOMS ARE PRIVATE - WITH BATHROOM, TELEPHONE, TELEVISION AND WIFI INTERNET. IF YOU ARE BEING DISCHARGED THE SAME DAY OF YOUR SURGERY--YOU CAN NOT DRIVE YOURSELF HOME--AND SHOULD NOT GO HOME ALONE BY TAXI OR BUS.  NO DRIVING OR OPERATING MACHINERY FOR 24 HOURS FOLLOWING ANESTHESIA / PAIN MEDICATIONS.                            SPECIAL INSTRUCTIONS:  CHLORHEXIDINE SOAP SHOWER (other brand names are Betasept and Hibiclens ) PLEASE SHOWER WITH CHLORHEXIDINE THE NIGHT BEFORE YOUR SURGERY AND THE AM OF YOUR SURGERY. DO NOT USE CHLORHEXIDINE ON YOUR FACE OR PRIVATE AREAS--YOU MAY USE YOUR NORMAL SOAP THOSE AREAS AND YOUR NORMAL SHAMPOO.  WOMEN SHOULD AVOID SHAVING UNDER ARMS AND SHAVING LEGS 48 HOURS BEFORE USING CHLORHEXIDINE TO AVOID SKIN IRRITATION.  DO NOT USE IF ALLERGIC TO CHLORHEXIDINE.  PLEASE READ OVER ANY  FACT SHEETS THAT YOU WERE GIVEN: MRSA INFORMATION

## 2011-09-27 ENCOUNTER — Encounter (HOSPITAL_COMMUNITY)
Admission: RE | Disposition: A | Discharge status: Home / Self Care | Payer: Self-pay | Source: Ambulatory Visit | Attending: Urology

## 2011-09-27 ENCOUNTER — Encounter (HOSPITAL_COMMUNITY): Payer: Self-pay | Admitting: *Deleted

## 2011-09-27 ENCOUNTER — Ambulatory Visit (HOSPITAL_COMMUNITY): Payer: Medicare Other | Admitting: *Deleted

## 2011-09-27 ENCOUNTER — Ambulatory Visit (HOSPITAL_COMMUNITY)
Admission: RE | Admit: 2011-09-27 | Discharge: 2011-09-27 | Disposition: A | Discharge status: Home / Self Care | Payer: Medicare Other | Source: Ambulatory Visit | Attending: Urology | Admitting: Urology

## 2011-09-27 DIAGNOSIS — C61 Malignant neoplasm of prostate: Secondary | ICD-10-CM

## 2011-09-27 DIAGNOSIS — Z01812 Encounter for preprocedural laboratory examination: Secondary | ICD-10-CM | POA: Diagnosis not present

## 2011-09-27 DIAGNOSIS — N411 Chronic prostatitis: Secondary | ICD-10-CM | POA: Diagnosis not present

## 2011-09-27 DIAGNOSIS — N390 Urinary tract infection, site not specified: Secondary | ICD-10-CM | POA: Diagnosis not present

## 2011-09-27 DIAGNOSIS — N4289 Other specified disorders of prostate: Secondary | ICD-10-CM | POA: Diagnosis not present

## 2011-09-27 DIAGNOSIS — F79 Unspecified intellectual disabilities: Secondary | ICD-10-CM | POA: Diagnosis not present

## 2011-09-27 HISTORY — PX: PROSTATE BIOPSY: SHX241

## 2011-09-27 SURGERY — BIOPSY, PROSTATE, RECTAL APPROACH, WITH US GUIDANCE
Anesthesia: Monitor Anesthesia Care | Wound class: Clean Contaminated

## 2011-09-27 MED ORDER — LACTATED RINGERS IV SOLN
INTRAVENOUS | Status: DC | PRN
  Administered 2011-09-27: 13:00:00 via INTRAVENOUS

## 2011-09-27 MED ORDER — VANCOMYCIN HCL IN DEXTROSE 1-5 GM/200ML-% IV SOLN
1000.0000 mg | INTRAVENOUS | Status: AC
  Administered 2011-09-27: 1000 mg via INTRAVENOUS

## 2011-09-27 MED ORDER — LIDOCAINE HCL 1 % IJ SOLN
INTRAMUSCULAR | Status: AC
  Filled 2011-09-27: qty 40

## 2011-09-27 MED ORDER — LACTATED RINGERS IV SOLN: INTRAVENOUS | Status: DC

## 2011-09-27 MED ORDER — GENTAMICIN SULFATE 40 MG/ML IJ SOLN: 160.0000 mg | Freq: Once | INTRAVENOUS | Status: DC

## 2011-09-27 MED ORDER — GENTAMICIN IN SALINE 1.6-0.9 MG/ML-% IV SOLN
INTRAVENOUS | Status: AC
  Filled 2011-09-27: qty 100

## 2011-09-27 MED ORDER — BUPIVACAINE HCL 0.25 % IJ SOLN
INTRAMUSCULAR | Status: DC | PRN
  Administered 2011-09-27: 10 mL

## 2011-09-27 MED ORDER — VANCOMYCIN HCL IN DEXTROSE 1-5 GM/200ML-% IV SOLN
INTRAVENOUS | Status: AC
  Filled 2011-09-27: qty 200

## 2011-09-27 MED ORDER — MIDAZOLAM HCL 5 MG/5ML IJ SOLN
INTRAMUSCULAR | Status: DC | PRN
  Administered 2011-09-27 (×2): 1 mg via INTRAVENOUS

## 2011-09-27 MED ORDER — PROPOFOL 10 MG/ML IV EMUL
INTRAVENOUS | Status: DC | PRN
  Administered 2011-09-27: 75 ug/kg/min via INTRAVENOUS

## 2011-09-27 MED ORDER — FENTANYL CITRATE 0.05 MG/ML IJ SOLN
INTRAMUSCULAR | Status: DC | PRN
  Administered 2011-09-27 (×2): 50 ug via INTRAVENOUS

## 2011-09-27 MED ORDER — PROMETHAZINE HCL 25 MG/ML IJ SOLN: 6.2500 mg | INTRAMUSCULAR | Status: DC | PRN

## 2011-09-27 MED ORDER — HYDROCODONE-ACETAMINOPHEN 5-325 MG PO TABS: 1.0000 | ORAL_TABLET | ORAL | Status: AC | PRN

## 2011-09-27 MED ORDER — FENTANYL CITRATE 0.05 MG/ML IJ SOLN: 25.0000 ug | INTRAMUSCULAR | Status: DC | PRN

## 2011-09-27 MED ORDER — BUPIVACAINE HCL (PF) 0.25 % IJ SOLN
INTRAMUSCULAR | Status: AC
  Filled 2011-09-27: qty 30

## 2011-09-27 SURGICAL SUPPLY — 13 items
DRESSING TELFA 8X3 (GAUZE/BANDAGES/DRESSINGS) ×1 IMPLANT
GLOVE BIOGEL M 7.0 STRL (GLOVE) IMPLANT
GLOVE BIOGEL PI IND STRL 7.5 (GLOVE) IMPLANT
GLOVE BIOGEL PI INDICATOR 7.5 (GLOVE)
GLOVE ECLIPSE 7.0 STRL STRAW (GLOVE) IMPLANT
IV NS IRRIG 3000ML ARTHROMATIC (IV SOLUTION) IMPLANT
NDL SAFETY ECLIPSE 18X1.5 (NEEDLE) IMPLANT
NEEDLE HYPO 18GX1.5 SHARP (NEEDLE) ×2
SYR CONTROL 10ML LL (SYRINGE) ×2 IMPLANT
TOWEL OR 17X26 10 PK STRL BLUE (TOWEL DISPOSABLE) IMPLANT
UNDERPAD 30X30 INCONTINENT (UNDERPADS AND DIAPERS) ×2 IMPLANT
WATER STERILE IRR 3000ML UROMA (IV SOLUTION) IMPLANT
WATER STERILE IRR 500ML POUR (IV SOLUTION) ×1 IMPLANT

## 2011-09-27 NOTE — H&P (Signed)
Urology History and Physical Exam  CC: Prostate cancer  HPI:    56 year old male with mental retardation presents today for confirmatory prostate biopsy.  He has a history of prostate cancer and has elected for active surveillance. He has Gleason 3+3 equal 6 in 2 cores less than 5% in March 2013. His most recent PSA was 6.77 on 08/24/11.  We discussed the recommendation to repeat a prostate biopsy to confirm the diagnosis and to ensure that there is not a higher grade of cancer present.  We have reviewed the risks, benefits, alternatives, and likelihood of achieving goals.  Catheterized specimen in clinic revealed enterococcus sensitive to doxycycline, levaquin, and vancomycin. He has been taking doxycycline and will receive vancomycin and gentamicin for preoperative IV antibiotics.   PMH: Past Medical History  Diagnosis Date  . Mental retardation 03-20-11    Sister states pt. developed retardation symptoms about age 72,? whether drug related. Start having seizures. Hx. ETOH abuse in past.   . PYELONEPHRITIS, HX OF 01/30/2008    Qualifier: Diagnosis of  By: Wallene Huh  MD, Rande Lawman    . Seizures 03-20-11    none in 2 yrs-tx. meds daily  ([facility])  . Cancer     PROSTATE CANCER--PROSTATE BIOPSY PLANNED EVERY 6 MONTHS--NO TX PLANNED UNLESS THE CANCER STARTS PROGRESSING-  . UTI (urinary tract infection) 09/25/11    PT ON ANTIBIOTIC AT PRESENT TIME    PSH: Past Surgical History  Procedure Date  . Prostate biopsy 03/27/2011    Procedure: BIOPSY TRANSRECTAL ULTRASONIC PROSTATE (TUBP);  Surgeon: Milford Cage, MD;  Location: [facility];  Service: Urology;  Laterality: N/A;  Prostatic Block         Allergies: No Known Allergies  Medications: No prescriptions prior to admission     Social History: History   Social History  . Marital Status: Single    Spouse Name: N/A    Number of Children: N/A  . Years of Education: N/A   Occupational History  . Not on file.   Social History Main  Topics  . Smoking status: Current Some Day Smoker -- 0.1 packs/day    Types: Cigarettes  . Smokeless tobacco: Not on file   Comment: SNEAKS A SMOKE EVERY NOW AND THEN  . Alcohol Use: No     none in 15 yrs,past ETOH abuse  . Drug Use: No  . Sexually Active: No   Other Topics Concern  . Not on file   Social History Narrative  . No narrative on file    Family History: No family history on file.  Review of Systems: Positive: None. Negative: Hematuria, fever, chest pain.  A further 10 point review of systems was negative except what is listed in the HPI.  Physical Exam:  General: No acute distress.  Awake. Head:  Normocephalic.  Atraumatic. ENT:  EOMI.  Mucous membranes moist Neck:  Supple.  No lymphadenopathy. CV:  S1 present. S2 present. Regular rate. Pulmonary: Equal effort bilaterally.  Clear to auscultation bilaterally. Abdomen: Soft.  Non- tender to palpation. Skin:  Normal turgor.  No visible rash. Extremity: No gross deformity of bilateral upper extremities.  No gross deformity of    bilateral lower extremities. Neurologic: Alert. Appropriate mood.   Studies:  Recent Labs  Basename 09/25/11 1135   HGB 13.7   WBC 4.4   PLT 244    Recent Labs  Basename 09/25/11 1200   NA 143   K 4.4   CL 106   CO2  29   BUN 18   CREATININE 0.84   CALCIUM 9.3   GFRNONAA >90   GFRAA >90     No results found for this basename: PT:2,INR:2,APTT:2 in the last 72 hours   No components found with this basename: ABG:2    Assessment:  Prostate cancer.  Plan: -To OR for transrectal prostate ultrasound, bilateral prostatic nerve block, prostate biopsy.

## 2011-09-27 NOTE — Addendum Note (Signed)
Addendum  created 09/27/11 1656 by Bevelyn Buckles, CRNA   Modules edited:Anesthesia Events

## 2011-09-27 NOTE — Anesthesia Preprocedure Evaluation (Signed)
Anesthesia Evaluation  Patient identified by MRN, date of birth, ID band Patient awake  General Assessment Comment:Pt with MR  Reviewed: Allergy & Precautions, H&P , NPO status , Patient's Chart, lab work & pertinent test results  Airway Mallampati: II TM Distance: >3 FB Neck ROM: Full    Dental No notable dental hx. (+) Dental Advisory Given   Pulmonary Current Smoker,  breath sounds clear to auscultation  Pulmonary exam normal       Cardiovascular negative cardio ROS  Rhythm:Regular Rate:Normal     Neuro/Psych Seizures -, Well Controlled,  PSYCHIATRIC DISORDERS Mental retardation negative neurological ROS  negative psych ROS   GI/Hepatic negative GI ROS, Neg liver ROS, (+)     substance abuse (ETOH abuse in past)  alcohol use,   Endo/Other  negative endocrine ROS  Renal/GU negative Renal ROS  negative genitourinary   Musculoskeletal negative musculoskeletal ROS (+)   Abdominal   Peds negative pediatric ROS (+)  Hematology negative hematology ROS (+)   Anesthesia Other Findings   Reproductive/Obstetrics negative OB ROS                           Anesthesia Physical Anesthesia Plan  ASA: III  Anesthesia Plan: MAC   Post-op Pain Management:    Induction: Intravenous  Airway Management Planned: Simple Face Mask  Additional Equipment:   Intra-op Plan:   Post-operative Plan:   Informed Consent: I have reviewed the patients History and Physical, chart, labs and discussed the procedure including the risks, benefits and alternatives for the proposed anesthesia with the patient or authorized representative who has indicated his/her understanding and acceptance.   Dental advisory given  Plan Discussed with: CRNA  Anesthesia Plan Comments:         Anesthesia Quick Evaluation

## 2011-09-27 NOTE — Anesthesia Postprocedure Evaluation (Signed)
Anesthesia Post Note  Patient: Jeffery [name]  Procedure(s) Performed: Procedure(s) (LRB): BIOPSY TRANSRECTAL ULTRASONIC PROSTATE (TUBP) (N/A)  Anesthesia type: General  Patient location: PACU  Post pain: Pain level controlled  Post assessment: Post-op Vital signs reviewed  Last Vitals:  Filed Vitals:   09/27/11 1500  BP: 110/79  Pulse: 60  Temp:   Resp: 14    Post vital signs: Reviewed  Level of consciousness: sedated  Complications: No apparent anesthesia complications

## 2011-09-27 NOTE — Progress Notes (Signed)
Family states pt. Fell on labor day weekend and bruised his left rib and back. Stated they took him to ED in [place] ,[place]. X-rays  taken per family and no broken bones. When they got back, the family notified his medical doctor.The doctor called in prescription for a  muscle relaxer,but pt.hasn't started yet due to his procedure today. Family states pt. Is doing better.

## 2011-09-27 NOTE — Addendum Note (Signed)
Addendum  created 09/27/11 1701 by Einar Pheasant, MD   Modules edited:Anesthesia Responsible Staff

## 2011-09-27 NOTE — Transfer of Care (Signed)
Immediate Anesthesia Transfer of Care Note  Patient: Jeffery [name]  Procedure(s) Performed: Procedure(s) (LRB) with comments: BIOPSY TRANSRECTAL ULTRASONIC PROSTATE (TUBP) (N/A) -      Patient Location: PACU  Anesthesia Type: MAC  Level of Consciousness: awake and alert   Airway & Oxygen Therapy: Patient Spontanous Breathing and Patient connected to face mask oxygen  Post-op Assessment: Report given to PACU RN and Post -op Vital signs reviewed and stable  Post vital signs: Reviewed and stable  Complications: No apparent anesthesia complications

## 2011-09-27 NOTE — Addendum Note (Signed)
Addendum  created 09/27/11 1702 by Einar Pheasant, MD   Modules edited:Anesthesia Responsible Staff

## 2011-09-27 NOTE — Op Note (Signed)
DATE OF PROCEDURE: 09/27/2011    OPERATIVE REPORT   SURGEON: Natalia Leatherwood, MD  ASSISTANT: None.   PREOPERATIVE DIAGNOSIS: Prostate cancer POSTOPERATIVE DIAGNOSIS: Same.   PROCEDURE PERFORMED:  1. Prostate biopsy.  2. Transrectal ultrasound imaging with interpretation.  3. Bilateral prostatic nerve block.   ANESTHETIC: Monitored anesthesia care and local nerve block.   LOCAL MEDICATION: 0.25% Marcaine without epinephrine, 10 mL.  DRAINS: None.  COMPLICATIONS: None.   FINDINGS:  Prostate measurement: Volume: 59 cc.  Length: 5.44 cm. Height: 3.82 cm. Width: 5.42 cm.  No hypoechoic areas.  Normal seminal vesicles.  No median lobe present:    COMPLICATIONS: None.   HISTORY OF PRESENT ILLNESS:  56 year old male with prostate cancer. He is here for confirmatory biopsy so he can enter active surveillance.  PROCEDURE: After informed consent was obtained, the patient was taken  to the operating room where he was placed in the supine position.  Monitored anesthesia care was induced and he was placed on his side.  SCDs were in place and turned on before induction of the monitored  anesthesia care. IV antibiotics were infused. A time-out was then  performed in which the correct patient, surgical site, and procedure  were identified and agreed upon by the team.   I then performed a digital rectal examination and estimated his prostate to be 45 g in size. There were no nodules or induration noted.  Next, the rectal ultrasound probe was advanced through the anus into the rectum. The  prostate was well visualized. I scanned the entire prostate and  performed measurements (results listed above).  Next, I performed a prostatic nerve block bilaterally by visualizing the base of the prostate with the junction of the seminal  vesicle on the right side and injecting 4 mL of 0.25% Marcaine without  epinephrine. Next, I advanced the probe to the apex of the right side  and injected 1  mL. Attention was turned to the left side at the junction of the base of the prostate and the seminal vesicle, and 4 mL were  injected here as well as 1 mL at the left apex giving a total of 10 mL  of injection. Each time before injecting, it did pull back on the  syringe to ensure I was not injecting into the vasculature.   Following this, the standard sextant biopsies were carried out by obtaining six  biopsies on each side at the lateral apex, midportion and base as well  as the medial apex, midportion, and base, bilaterally.    After this was done, there was noted to be no rectal bleeding or any other  complications. The probe was removed. This completed the procedure.  He was placed in back in a supine position. Anesthesia was reversed.  He was taken to the PACU in a stable condition.

## 2011-09-29 ENCOUNTER — Encounter (HOSPITAL_COMMUNITY): Payer: Self-pay | Admitting: Urology

## 2011-10-13 DIAGNOSIS — N390 Urinary tract infection, site not specified: Secondary | ICD-10-CM | POA: Diagnosis not present

## 2011-10-13 DIAGNOSIS — R82998 Other abnormal findings in urine: Secondary | ICD-10-CM | POA: Diagnosis not present

## 2011-11-02 DIAGNOSIS — Z87440 Personal history of urinary (tract) infections: Secondary | ICD-10-CM | POA: Diagnosis not present

## 2011-11-16 DIAGNOSIS — Z79899 Other long term (current) drug therapy: Secondary | ICD-10-CM | POA: Diagnosis not present

## 2011-11-16 DIAGNOSIS — G40319 Generalized idiopathic epilepsy and epileptic syndromes, intractable, without status epilepticus: Secondary | ICD-10-CM | POA: Diagnosis not present

## 2011-11-16 DIAGNOSIS — C61 Malignant neoplasm of prostate: Secondary | ICD-10-CM | POA: Diagnosis not present

## 2011-11-16 DIAGNOSIS — F73 Profound intellectual disabilities: Secondary | ICD-10-CM | POA: Diagnosis not present

## 2011-11-17 DIAGNOSIS — K135 Oral submucous fibrosis: Secondary | ICD-10-CM | POA: Diagnosis not present

## 2011-12-18 DIAGNOSIS — C61 Malignant neoplasm of prostate: Secondary | ICD-10-CM | POA: Diagnosis not present

## 2012-01-15 DIAGNOSIS — R82998 Other abnormal findings in urine: Secondary | ICD-10-CM | POA: Diagnosis not present

## 2012-01-15 DIAGNOSIS — C61 Malignant neoplasm of prostate: Secondary | ICD-10-CM | POA: Diagnosis not present

## 2012-05-22 DIAGNOSIS — C61 Malignant neoplasm of prostate: Secondary | ICD-10-CM | POA: Diagnosis not present

## 2012-05-28 DIAGNOSIS — C61 Malignant neoplasm of prostate: Secondary | ICD-10-CM | POA: Diagnosis not present

## 2012-05-28 DIAGNOSIS — N400 Enlarged prostate without lower urinary tract symptoms: Secondary | ICD-10-CM | POA: Diagnosis not present

## 2012-05-28 DIAGNOSIS — N390 Urinary tract infection, site not specified: Secondary | ICD-10-CM | POA: Diagnosis not present

## 2012-06-25 DIAGNOSIS — R82998 Other abnormal findings in urine: Secondary | ICD-10-CM | POA: Diagnosis not present

## 2012-08-30 DIAGNOSIS — C61 Malignant neoplasm of prostate: Secondary | ICD-10-CM | POA: Diagnosis not present

## 2012-08-31 ENCOUNTER — Other Ambulatory Visit: Payer: Self-pay | Admitting: Neurology

## 2012-09-05 DIAGNOSIS — R82998 Other abnormal findings in urine: Secondary | ICD-10-CM | POA: Diagnosis not present

## 2012-09-05 DIAGNOSIS — N400 Enlarged prostate without lower urinary tract symptoms: Secondary | ICD-10-CM | POA: Diagnosis not present

## 2012-09-05 DIAGNOSIS — C61 Malignant neoplasm of prostate: Secondary | ICD-10-CM | POA: Diagnosis not present

## 2012-09-05 DIAGNOSIS — Z87440 Personal history of urinary (tract) infections: Secondary | ICD-10-CM | POA: Diagnosis not present

## 2012-10-02 ENCOUNTER — Ambulatory Visit (INDEPENDENT_AMBULATORY_CARE_PROVIDER_SITE_OTHER): Payer: Medicare Other | Admitting: Family Medicine

## 2012-10-02 ENCOUNTER — Other Ambulatory Visit: Payer: Self-pay | Admitting: Family Medicine

## 2012-10-02 ENCOUNTER — Encounter: Payer: Self-pay | Admitting: Family Medicine

## 2012-10-02 VITALS — BP 104/63 | HR 76 | Temp 98.5°F | Wt 134.0 lb

## 2012-10-02 DIAGNOSIS — F17200 Nicotine dependence, unspecified, uncomplicated: Secondary | ICD-10-CM | POA: Diagnosis not present

## 2012-10-02 DIAGNOSIS — R82998 Other abnormal findings in urine: Secondary | ICD-10-CM | POA: Diagnosis not present

## 2012-10-02 DIAGNOSIS — F70 Mild intellectual disabilities: Secondary | ICD-10-CM | POA: Diagnosis not present

## 2012-10-02 DIAGNOSIS — Z720 Tobacco use: Secondary | ICD-10-CM

## 2012-10-02 DIAGNOSIS — Z0000 Encounter for general adult medical examination without abnormal findings: Secondary | ICD-10-CM

## 2012-10-02 DIAGNOSIS — Z23 Encounter for immunization: Secondary | ICD-10-CM

## 2012-10-02 DIAGNOSIS — R972 Elevated prostate specific antigen [PSA]: Secondary | ICD-10-CM | POA: Diagnosis not present

## 2012-10-02 DIAGNOSIS — R569 Unspecified convulsions: Secondary | ICD-10-CM

## 2012-10-02 DIAGNOSIS — E7800 Pure hypercholesterolemia, unspecified: Secondary | ICD-10-CM | POA: Diagnosis not present

## 2012-10-02 DIAGNOSIS — R8271 Bacteriuria: Secondary | ICD-10-CM

## 2012-10-02 DIAGNOSIS — R8290 Unspecified abnormal findings in urine: Secondary | ICD-10-CM | POA: Insufficient documentation

## 2012-10-02 LAB — COMPREHENSIVE METABOLIC PANEL
Albumin: 4 g/dL (ref 3.5–5.2)
Alkaline Phosphatase: 86 U/L (ref 39–117)
BUN: 18 mg/dL (ref 6–23)
Calcium: 8.9 mg/dL (ref 8.4–10.5)
Creat: 0.77 mg/dL (ref 0.50–1.35)
Glucose, Bld: 72 mg/dL (ref 70–99)
Potassium: 4.3 mEq/L (ref 3.5–5.3)

## 2012-10-02 LAB — POCT URINALYSIS DIPSTICK
Blood, UA: NEGATIVE
Glucose, UA: NEGATIVE
Spec Grav, UA: >=1.03
Urobilinogen, UA: 1
pH, UA: 7

## 2012-10-02 LAB — POCT UA - MICROSCOPIC ONLY: WBC, Ur, HPF, POC: >20

## 2012-10-02 MED ORDER — PRAVASTATIN SODIUM 40 MG PO TABS: 40.0000 mg | ORAL_TABLET | Freq: Every day | ORAL | Status: DC

## 2012-10-02 MED ORDER — TETANUS-DIPHTH-ACELL PERTUSSIS 5-2.5-18.5 LF-MCG/0.5 IM SUSP: 0.5000 mL | Freq: Once | INTRAMUSCULAR | Status: DC

## 2012-10-02 MED ORDER — AMOXICILLIN-POT CLAVULANATE 875-125 MG PO TABS: 1.0000 | ORAL_TABLET | Freq: Two times a day (BID) | ORAL | Status: DC

## 2012-10-02 NOTE — Progress Notes (Signed)
Jeffery [name] is a 57 y.o. male who presents to [facility] today for routine physical  Smelly urine: ever since Dx w/ prostate cancer. Frequent UTIs. No abdominal pain or dysuria. Pt has very poor insight into condition or pain.   Tobacco: sneaks a cigarette occasionally   Seizures: no recent seizure. Last one 2.5-53yrs ago. Followed by Neuro regularly.   Last PSA 4.8. Prostate cancer. followed by Urology. Next biopsy scheduled for sometime in the next few months.   The following portions of the patient's history were reviewed and updated as appropriate: allergies, current medications, past medical history, family and social history, and problem list.  Patient is a smoker.  Past Medical History  Diagnosis Date  . Mental retardation 03-20-11    Sister states pt. developed retardation symptoms about age 22,? whether drug related. Start having seizures. Hx. ETOH abuse in past.   . PYELONEPHRITIS, HX OF 01/30/2008    Qualifier: Diagnosis of  By: Wallene Huh  MD, Rande Lawman    . Seizures 03-20-11    none in 2 yrs-tx. meds daily  ([facility])  . Cancer     PROSTATE CANCER--PROSTATE BIOPSY PLANNED EVERY 6 MONTHS--NO TX PLANNED UNLESS THE CANCER STARTS PROGRESSING-  . UTI (urinary tract infection) 09/25/11    PT ON ANTIBIOTIC AT PRESENT TIME    ROS as above otherwise neg.    Medications reviewed. Current Outpatient Prescriptions  Medication Sig Dispense Refill  . DILANTIN 100 MG ER capsule TAKE 3 CAPSULES BY MOUTH AT BEDTIME  270 capsule  0  . divalproex (DEPAKOTE ER) 500 MG 24 hr tablet Take 1,500 mg by mouth at bedtime.      . pravastatin (PRAVACHOL) 40 MG tablet Take 40 mg by mouth daily. HAS NOT STARTED THIS MEDICATION YET-PLANS TO START AFTER PROSTATE BIOPSY       No current facility-administered medications for this visit.   Facility-Administered Medications Ordered in Other Visits  Medication Dose Route Frequency Provider Last Rate Last Dose  . gentamicin (GARAMYCIN) 160 mg in dextrose 5 % 50 mL IVPB  160  mg Intravenous Once Milford Cage, MD      . sodium phosphate (FLEET) 7-19 GM/118ML enema 1 enema  1 enema Rectal Once Milford Cage, MD        Exam:  BP 104/63  Pulse 76  Temp(Src) 98.5 F (36.9 C) (Oral)  Wt 134 lb (60.782 kg)  BMI 20.98 kg/m2 Gen: Well NAD HEENT: EOMI,  MMM, R ear complete obstruction of TM by cerumen --------- TM nml bilat after ear flush Lungs: CTABL Nl WOB Heart: RRR no MRG Abd: NABS, NT, ND Exts: Non edematous BL  LE, warm and well perfused.  Neuro: no gait abnormality  No results found for this or any previous visit (from the past 72 hour(s)).

## 2012-10-02 NOTE — Assessment & Plan Note (Signed)
Cont prava 

## 2012-10-02 NOTE — Assessment & Plan Note (Signed)
Cont prostate cancer care through urology

## 2012-10-02 NOTE — Assessment & Plan Note (Signed)
Very infrequent user. Difficult to get to completely quit due to MR

## 2012-10-02 NOTE — Assessment & Plan Note (Signed)
Cerumen impaction making hearing loss worse R ear flushed today

## 2012-10-02 NOTE — Patient Instructions (Addendum)
Marqus is doing well overall I will let you know if anything comes back in his urine that is suspiscious for an infection He is doing well overall. I will let you know if his blood work comes back abnormal Please bring him back in 6 months or sooner if needed

## 2012-10-02 NOTE — Assessment & Plan Note (Signed)
Continue current regimen Mgt by Neuro

## 2012-10-02 NOTE — Assessment & Plan Note (Addendum)
Pt w/ h/o pyelonephritis and frequent UTI. High paintolerance UA/UCX  --------------------------------- ADDENDUM  Pt father called after hours to inform of urine results of numerous G+ Cocci. Unusual to have this kind of bacteria, but pt w/ recent h/o of prostate biopsy. Will treat w/ Augmentin until Culture results w/ sensitivities come back. Father aware of results and will pick up Rx for treatment.

## 2012-10-03 NOTE — Assessment & Plan Note (Signed)
Well cared for by family. fille dout paper work for adult daycare.  Needs daily/regular supervision

## 2012-10-04 DIAGNOSIS — Z23 Encounter for immunization: Secondary | ICD-10-CM | POA: Diagnosis not present

## 2012-10-04 LAB — URINE CULTURE

## 2012-11-15 ENCOUNTER — Encounter: Payer: Self-pay | Admitting: Nurse Practitioner

## 2012-11-15 ENCOUNTER — Encounter (INDEPENDENT_AMBULATORY_CARE_PROVIDER_SITE_OTHER): Payer: Self-pay

## 2012-11-15 ENCOUNTER — Ambulatory Visit (INDEPENDENT_AMBULATORY_CARE_PROVIDER_SITE_OTHER): Payer: Medicare Other | Admitting: Nurse Practitioner

## 2012-11-15 VITALS — BP 113/70 | HR 68 | Ht 70.0 in | Wt 133.0 lb

## 2012-11-15 DIAGNOSIS — F73 Profound intellectual disabilities: Secondary | ICD-10-CM | POA: Diagnosis not present

## 2012-11-15 DIAGNOSIS — R569 Unspecified convulsions: Secondary | ICD-10-CM | POA: Diagnosis not present

## 2012-11-15 DIAGNOSIS — Z5181 Encounter for therapeutic drug level monitoring: Secondary | ICD-10-CM | POA: Insufficient documentation

## 2012-11-15 DIAGNOSIS — Z79899 Other long term (current) drug therapy: Secondary | ICD-10-CM

## 2012-11-15 MED ORDER — DIVALPROEX SODIUM ER 500 MG PO TB24: 1500.0000 mg | ORAL_TABLET | Freq: Every day | ORAL | Status: DC

## 2012-11-15 MED ORDER — DILANTIN 100 MG PO CAPS: 300.0000 mg | ORAL_CAPSULE | Freq: Every day | ORAL | Status: DC
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
## 2012-11-15 NOTE — Patient Instructions (Signed)
Pt to continue Dilantin and Depakote, will refill Check labs today F/U yearly and prn

## 2012-11-18 NOTE — Progress Notes (Signed)
I agree with the assessment and plan as directed by NP .The patient is known to me .   Geron Mulford, MD  

## 2012-11-19 ENCOUNTER — Other Ambulatory Visit: Payer: Self-pay | Admitting: Nurse Practitioner

## 2012-11-19 DIAGNOSIS — Z79899 Other long term (current) drug therapy: Secondary | ICD-10-CM

## 2012-11-19 LAB — CBC WITH DIFFERENTIAL/PLATELET
Basophils Absolute: 0 10*3/uL (ref 0.0–0.2)
Eosinophils Absolute: 0.2 10*3/uL (ref 0.0–0.4)
HCT: 44 % (ref 37.5–51.0)
Immature Granulocytes: 0 %
Lymphocytes Absolute: 2.4 10*3/uL (ref 0.7–3.1)
MCHC: 33.9 g/dL (ref 31.5–35.7)
MCV: 97 fL (ref 79–97)
Monocytes Absolute: 0.5 10*3/uL (ref 0.1–0.9)
RDW: 13.4 % (ref 12.3–15.4)

## 2012-11-27 ENCOUNTER — Other Ambulatory Visit (INDEPENDENT_AMBULATORY_CARE_PROVIDER_SITE_OTHER): Payer: Self-pay

## 2012-11-27 DIAGNOSIS — Z79899 Other long term (current) drug therapy: Secondary | ICD-10-CM

## 2012-11-27 DIAGNOSIS — Z0289 Encounter for other administrative examinations: Secondary | ICD-10-CM

## 2012-11-28 ENCOUNTER — Telehealth: Payer: Self-pay | Admitting: Nurse Practitioner

## 2012-11-28 DIAGNOSIS — Z79899 Other long term (current) drug therapy: Secondary | ICD-10-CM

## 2012-11-28 MED ORDER — PHENYTOIN 50 MG PO CHEW: 50.0000 mg | CHEWABLE_TABLET | Freq: Every day | ORAL | Status: DC

## 2012-11-28 NOTE — Telephone Encounter (Signed)
Called and spoke to brother in law. He had stopped Dilantin for 2 days prior to blood draw, dropped from 31.4 to 12. Will add additional 50mg  to the 200mg  and get repeat levels in 10 to 14 days. If continues with ups and downs may need to switch to another medication.He understands

## 2012-12-16 ENCOUNTER — Telehealth: Payer: Self-pay | Admitting: Nurse Practitioner

## 2012-12-16 NOTE — Telephone Encounter (Signed)
Please ask BIL to have Dilantin level repeated so we can be certain he is on the correct dose after recent dose change.

## 2012-12-17 NOTE — Telephone Encounter (Signed)
Spoke to brother-in-law and he will bring him in tomorrow for Dilantin level.

## 2012-12-19 ENCOUNTER — Other Ambulatory Visit (INDEPENDENT_AMBULATORY_CARE_PROVIDER_SITE_OTHER): Payer: Self-pay

## 2012-12-19 ENCOUNTER — Other Ambulatory Visit: Payer: Self-pay | Admitting: Nurse Practitioner

## 2012-12-19 DIAGNOSIS — G40909 Epilepsy, unspecified, not intractable, without status epilepticus: Secondary | ICD-10-CM

## 2012-12-19 DIAGNOSIS — Z79899 Other long term (current) drug therapy: Secondary | ICD-10-CM | POA: Diagnosis not present

## 2012-12-19 DIAGNOSIS — Z0289 Encounter for other administrative examinations: Secondary | ICD-10-CM

## 2012-12-20 LAB — PHENYTOIN LEVEL, TOTAL: Phenytoin Lvl: 13.6 ug/mL (ref 10.0–20.0)

## 2012-12-20 NOTE — Progress Notes (Signed)
I called and let patient know, per Vella Redhead, NP, please continue same dose of dilantin.

## 2013-02-06 ENCOUNTER — Other Ambulatory Visit: Payer: Self-pay | Admitting: Nurse Practitioner

## 2013-02-06 NOTE — Telephone Encounter (Signed)
Patient requesting phenytoin refill since he is completely out (he took his last pill yesterday), patient wants it to be written for 90 day supply.

## 2013-08-13 ENCOUNTER — Other Ambulatory Visit: Payer: Self-pay | Admitting: Neurology

## 2013-09-16 ENCOUNTER — Telehealth: Payer: Self-pay | Admitting: Nurse Practitioner

## 2013-09-16 NOTE — Telephone Encounter (Signed)
Patient's brother-in-law requesting sooner appointment than scheduled in October due to patient attending adult day care and needing to be seen by 10/02/2013.  Best number to call is [phone]

## 2013-09-17 NOTE — Telephone Encounter (Signed)
No available openings before 10-02-13 for you or Dr. Ellwood Sayers. Please advise.

## 2013-09-17 NOTE — Telephone Encounter (Signed)
He can be placed on a waiting list for both of [place].

## 2013-09-19 NOTE — Telephone Encounter (Signed)
Patient was scheduled an appointment on September 24, 2013 at 3:00 pm with  the patient's brother-in-law.

## 2013-09-23 ENCOUNTER — Other Ambulatory Visit: Payer: Self-pay | Admitting: Urology

## 2013-09-23 ENCOUNTER — Other Ambulatory Visit: Payer: Self-pay | Admitting: *Deleted

## 2013-09-23 DIAGNOSIS — N401 Enlarged prostate with lower urinary tract symptoms: Secondary | ICD-10-CM | POA: Diagnosis not present

## 2013-09-23 DIAGNOSIS — C61 Malignant neoplasm of prostate: Secondary | ICD-10-CM | POA: Diagnosis not present

## 2013-09-23 DIAGNOSIS — N3020 Other chronic cystitis without hematuria: Secondary | ICD-10-CM | POA: Diagnosis not present

## 2013-09-23 DIAGNOSIS — N138 Other obstructive and reflux uropathy: Secondary | ICD-10-CM | POA: Diagnosis not present

## 2013-09-24 ENCOUNTER — Encounter (INDEPENDENT_AMBULATORY_CARE_PROVIDER_SITE_OTHER): Payer: Self-pay

## 2013-09-24 ENCOUNTER — Ambulatory Visit (INDEPENDENT_AMBULATORY_CARE_PROVIDER_SITE_OTHER): Payer: Medicare Other | Admitting: Neurology

## 2013-09-24 ENCOUNTER — Other Ambulatory Visit: Payer: Self-pay | Admitting: *Deleted

## 2013-09-24 ENCOUNTER — Encounter: Payer: Self-pay | Admitting: Neurology

## 2013-09-24 VITALS — BP 105/69 | HR 69 | Ht 70.0 in | Wt 148.0 lb

## 2013-09-24 DIAGNOSIS — R561 Post traumatic seizures: Secondary | ICD-10-CM | POA: Diagnosis not present

## 2013-09-24 DIAGNOSIS — F71 Moderate intellectual disabilities: Secondary | ICD-10-CM

## 2013-09-24 MED ORDER — DILANTIN 100 MG PO CAPS: 200.0000 mg | ORAL_CAPSULE | Freq: Every day | ORAL | Status: DC

## 2013-09-24 MED ORDER — DIVALPROEX SODIUM ER 500 MG PO TB24: 1500.0000 mg | ORAL_TABLET | Freq: Every day | ORAL | Status: DC

## 2013-09-24 MED ORDER — PHENYTOIN 50 MG PO CHEW: CHEWABLE_TABLET | ORAL | Status: DC
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
## 2013-09-24 NOTE — Patient Instructions (Signed)
Valproic Acid, Divalproex Sodium capsules What is this medicine? VALPROIC ACID (val PROE ik AS id) is used to treat certain types of seizures in patients with epilepsy. This medicine may be used for other purposes; ask your health care provider or pharmacist if you have questions. COMMON BRAND NAME(S): Depakene What should I tell my health care provider before I take this medicine? They need to know if you have any of these conditions: -blood disease -brain damage or disease -kidney disease -liver disease -low blood proteins -mitochondrial disease -suicidal thoughts, plans, or attempt; a previous suicide attempt by you or a family member -urea cycle disorder (UCD) -an unusual or allergic reaction to divalproex sodium, other medicines, foods, dyes, or preservatives -pregnant or trying to get pregnant -breast-feeding How should I use this medicine? Take this medicine by mouth. Follow the directions on the prescription label. Swallow the capsules whole with a drink of water. Do not crush or chew. If this medicine upsets your stomach, take it with food or milk. Take your doses at regular intervals. Do not take your medicine more often than directed. Do not stop taking this medicine unless instructed by your doctor or health care professional. Stopping your medicine suddenly can increase your seizures or their severity. Talk to your pediatrician regarding the use of this medicine in children. Special care may be needed. Overdosage: If you think you have taken too much of this medicine contact a poison control center or emergency room at once. NOTE: This medicine is only for you. Do not share this medicine with others. What if I miss a dose? If you miss a dose, take it as soon as you can. If it is almost time for your next dose, take only that dose. Do not take double or extra doses. What may interact with this medicine? -aspirin -barbiturates, like  phenobarbital -diazepam -isoniazid -medicines for depression, anxiety, or psychotic disturbances -medicines that treat or prevent blood clots like warfarin -meropenem -other seizure medicines -rifampin -tolbutamide -zidovudine This list may not describe all possible interactions. Give your health care provider a list of all the medicines, herbs, non-prescription drugs, or dietary supplements you use. Also tell them if you smoke, drink alcohol, or use illegal drugs. Some items may interact with your medicine. What should I watch for while using this medicine? Visit your doctor or health care professional for regular checks on your progress. Wear a Probation officer or necklace. Carry an identification card with information about your condition, medications, and doctor or health care professional. Dennis Bast may get drowsy, dizzy, or have blurred vision. Do not drive, use machinery, or do anything that needs mental alertness until you know how this medicine affects you. To reduce dizzy or fainting spells, do not sit or stand up quickly, especially if you are an older patient. Alcohol can increase drowsiness and dizziness. Avoid alcoholic drinks. This medicine can cause blood problems. This can mean slow healing and a risk of infection. Problems can arise if you need dental work, and in the day to day care of your teeth. Try to avoid damage to your teeth and gums when you brush or floss your teeth. This medicine can make you more sensitive to the sun. Keep out of the sun. If you cannot avoid being in the sun, wear protective clothing and use sunscreen. Do not use sun lamps or tanning beds/booths. The use of this medicine may increase the chance of suicidal thoughts or actions. Pay special attention to how you are responding  while on this medicine. Any worsening of mood, or thoughts of suicide or dying should be reported to your health care professional right away. Women who become pregnant while using this  medicine may enroll in the [place] Pregnancy Registry by calling [phone]. This registry collects information about the safety of antiepileptic drug use during pregnancy. What side effects may I notice from receiving this medicine? Side effects that you should report to your doctor or health care professional as soon as possible: -allergic reactions like skin rash, itching or hives, swelling of the face, lips, or tongue -blurred or double vision or uncontrollable eye movements -changes in the frequency or severity of seizures -nausea and vomiting -redness, blistering, peeling or loosening of the skin, including inside the mouth -stomach pain or cramps -trembling of hands or arms -unusual bleeding or bruising or pinpoint red spots on the skin -unusual swelling of the arms or legs -unusually weak or tired -worsening of mood, thoughts or actions of suicide or dying -yellowing of skin or eyes Side effects that usually do not require medical attention (report to your doctor or health care professional if they continue or are bothersome): -change in menstrual cycle -diarrhea or constipation -headache -loss of bladder control -loss of hair or unusual growth of hair -loss or increase in appetite -weight gain or loss This list may not describe all possible side effects. Call your doctor for medical advice about side effects. You may report side effects to FDA at 1-800-FDA-1088. Where should I keep my medicine? Keep out of reach of children. Store at room temperature between 15 and 25 degrees C (59 and 77 degrees F). Keep container tightly closed. Throw away any unused medicine after the expiration date. NOTE: This sheet is a summary. It may not cover all possible information. If you have questions about this medicine, talk to your doctor, pharmacist, or health care provider.  2015, Elsevier/Gold Standard. (2011-08-29 11:45:44)

## 2013-09-25 LAB — CBC WITH DIFFERENTIAL
Basophils Absolute: 0 10*3/uL (ref 0.0–0.2)
Basos: 0 %
EOS: 5 %
Eosinophils Absolute: 0.3 10*3/uL (ref 0.0–0.4)
HCT: 41.5 % (ref 37.5–51.0)
HEMOGLOBIN: 14 g/dL (ref 12.6–17.7)
IMMATURE GRANS (ABS): 0 10*3/uL (ref 0.0–0.1)
Immature Granulocytes: 0 %
Lymphocytes Absolute: 2.3 10*3/uL (ref 0.7–3.1)
Lymphs: 46 %
MCH: 32.5 pg (ref 26.6–33.0)
MCHC: 33.7 g/dL (ref 31.5–35.7)
MCV: 96 fL (ref 79–97)
Monocytes Absolute: 0.4 10*3/uL (ref 0.1–0.9)
Monocytes: 8 %
NEUTROS PCT: 41 %
Neutrophils Absolute: 2.1 10*3/uL (ref 1.4–7.0)
Platelets: 165 10*3/uL (ref 150–379)
RBC: 4.31 x10E6/uL (ref 4.14–5.80)
RDW: 13.5 % (ref 12.3–15.4)
WBC: 5 10*3/uL (ref 3.4–10.8)

## 2013-09-25 LAB — COMPREHENSIVE METABOLIC PANEL
A/G RATIO: 1.6 (ref 1.1–2.5)
ALT: 25 IU/L (ref 0–44)
AST: 20 IU/L (ref 0–40)
Albumin: 4.1 g/dL (ref 3.6–4.8)
Alkaline Phosphatase: 94 IU/L (ref 39–117)
BUN/Creatinine Ratio: 14 (ref 10–22)
BUN: 14 mg/dL (ref 8–27)
CALCIUM: 9.3 mg/dL (ref 8.6–10.2)
CO2: 24 mmol/L (ref 18–29)
CREATININE: 0.99 mg/dL (ref 0.76–1.27)
Chloride: 103 mmol/L (ref 97–108)
GFR calc Af Amer: 95 mL/min/{1.73_m2} (ref >59)
GFR calc non Af Amer: 82 mL/min/{1.73_m2} (ref >59)
GLOBULIN, TOTAL: 2.6 g/dL (ref 1.5–4.5)
GLUCOSE: 76 mg/dL (ref 65–99)
Potassium: 4.3 mmol/L (ref 3.5–5.2)
Sodium: 145 mmol/L — ABNORMAL HIGH (ref 134–144)
Total Bilirubin: <0.2 mg/dL (ref 0.0–1.2)
Total Protein: 6.7 g/dL (ref 6.0–8.5)

## 2013-09-25 LAB — PHENYTOIN LEVEL, TOTAL: Phenytoin Lvl: 20 ug/mL (ref 10.0–20.0)

## 2013-09-25 LAB — VALPROIC ACID LEVEL: Valproic Acid Lvl: 55 ug/mL (ref 50–100)

## 2013-09-25 MED ORDER — PRAVASTATIN SODIUM 40 MG PO TABS: 40.0000 mg | ORAL_TABLET | Freq: Every day | ORAL | Status: DC

## 2013-10-24 ENCOUNTER — Encounter (HOSPITAL_COMMUNITY): Payer: Self-pay | Admitting: Pharmacy Technician

## 2013-10-27 ENCOUNTER — Encounter (HOSPITAL_COMMUNITY): Payer: Self-pay

## 2013-10-27 ENCOUNTER — Encounter (HOSPITAL_COMMUNITY)
Admission: RE | Admit: 2013-10-27 | Discharge: 2013-10-27 | Disposition: A | Discharge status: Home / Self Care | Payer: Medicare Other | Source: Ambulatory Visit | Attending: Urology | Admitting: Urology

## 2013-10-27 DIAGNOSIS — E780 Pure hypercholesterolemia: Secondary | ICD-10-CM | POA: Diagnosis not present

## 2013-10-27 DIAGNOSIS — Z87891 Personal history of nicotine dependence: Secondary | ICD-10-CM | POA: Diagnosis not present

## 2013-10-27 DIAGNOSIS — N3020 Other chronic cystitis without hematuria: Secondary | ICD-10-CM | POA: Diagnosis not present

## 2013-10-27 DIAGNOSIS — G40909 Epilepsy, unspecified, not intractable, without status epilepticus: Secondary | ICD-10-CM | POA: Diagnosis not present

## 2013-10-27 DIAGNOSIS — C61 Malignant neoplasm of prostate: Secondary | ICD-10-CM | POA: Diagnosis not present

## 2013-10-27 DIAGNOSIS — F79 Unspecified intellectual disabilities: Secondary | ICD-10-CM | POA: Diagnosis not present

## 2013-10-27 HISTORY — DX: Pure hypercholesterolemia, unspecified: E78.00

## 2013-10-27 LAB — CBC
HCT: 41.8 % (ref 39.0–52.0)
HEMOGLOBIN: 13.8 g/dL (ref 13.0–17.0)
MCH: 31.5 pg (ref 26.0–34.0)
MCHC: 33 g/dL (ref 30.0–36.0)
MCV: 95.4 fL (ref 78.0–100.0)
PLATELETS: 172 10*3/uL (ref 150–400)
RBC: 4.38 MIL/uL (ref 4.22–5.81)
RDW: 12.7 % (ref 11.5–15.5)
WBC: 4.8 10*3/uL (ref 4.0–10.5)

## 2013-10-27 LAB — BASIC METABOLIC PANEL
Anion gap: 9 (ref 5–15)
BUN: 15 mg/dL (ref 6–23)
CO2: 29 mEq/L (ref 19–32)
Calcium: 9.3 mg/dL (ref 8.4–10.5)
Chloride: 103 mEq/L (ref 96–112)
Creatinine, Ser: 0.83 mg/dL (ref 0.50–1.35)
Glucose, Bld: 77 mg/dL (ref 70–99)
POTASSIUM: 4.7 meq/L (ref 3.7–5.3)
Sodium: 141 mEq/L (ref 137–147)

## 2013-10-27 NOTE — Progress Notes (Signed)
LOV note Dr. Brett Fairy 09/24/13 on chart

## 2013-10-27 NOTE — Patient Instructions (Addendum)
[place]  10/27/2013   Your procedure is scheduled on: Wednesday 10/29/13  Report to [facility] at 09:30 AM.  Call this number if you have problems the morning of surgery [phone]: [phone]   Remember: follow all bowel prep instructions    Do not drink liquids After Midnight.   Do not wear jewelry, make-up or nail polish.  Do not wear lotions, powders, or perfumes.   Do not shave 48 hours prior to surgery. Men may shave face and neck.  Do not bring valuables to the hospital.  Contacts, dentures or bridgework may not be worn into surgery.   Patients discharged the day of surgery will not be allowed to drive home.  Name and phone number of your driver:Tremond Landry Mellow (brother in law) [phone]   Paulette Blanch, RN  pre op nurse call if needed [phone]    [facility] - Preparing for Surgery Before surgery, you can play an important role.  Because skin is not sterile, your skin needs to be as free of germs as possible.  You can reduce the number of germs on your skin by washing with CHG (chlorahexidine gluconate) soap before surgery.  CHG is an antiseptic cleaner which kills germs and bonds with the skin to continue killing germs even after washing. Please DO NOT use if you have an allergy to CHG or antibacterial soaps.  If your skin becomes reddened/irritated stop using the CHG and inform your nurse when you arrive at Short Stay. Do not shave (including legs and underarms) for at least 48 hours prior to the first CHG shower.  You may shave your face/neck. Please follow these instructions carefully:  1.  Shower with CHG Soap the night before surgery and the  morning of Surgery.  2.  If you choose to wash your hair, wash your hair first as usual with your  normal  shampoo.  3.  After you shampoo, rinse your hair and body thoroughly to remove the  shampoo.                            4.  Use CHG as you would any other liquid soap.  You can apply chg directly  to the  skin and wash                       Gently with a scrungie or clean washcloth.  5.  Apply the CHG Soap to your body ONLY FROM THE NECK DOWN.   Do not use on face/ open                           Wound or open sores. Avoid contact with eyes, ears mouth and genitals (private parts).                       Wash face,  Genitals (private parts) with your normal soap.             6.  Wash thoroughly, paying special attention to the area where your surgery  will be performed.  7.  Thoroughly rinse your body with warm water from the neck down.  8.  DO NOT shower/wash with your normal soap after using and rinsing off  the CHG Soap.                9.  Pat yourself  dry with a clean towel.            10.  Wear clean pajamas.            11.  Place clean sheets on your bed the night of your first shower and do not  sleep with pets. Day of Surgery : Do not apply any lotions/deodorants the morning of surgery.  Please wear clean clothes to the hospital/surgery center.  FAILURE TO FOLLOW THESE INSTRUCTIONS MAY RESULT IN THE CANCELLATION OF YOUR SURGERY PATIENT SIGNATURE_________________________________  NURSE SIGNATURE__________________________________  ________________________________________________________________________    CLEAR LIQUID DIET   Foods Allowed                                                                     Foods Excluded  Coffee and tea, regular and decaf                             liquids that you cannot  Plain Jell-O in any flavor                                             see through such as: Fruit ices (not with fruit pulp)                                     milk, soups, orange juice  Iced Popsicles                                    All solid food Carbonated beverages, regular and diet                                    Cranberry, grape and apple juices Sports drinks like Gatorade Lightly seasoned clear broth or consume(fat free) Sugar, honey syrup  Sample  Menu Breakfast                                Lunch                                     Supper Cranberry juice                    Beef broth                            Chicken broth Jell-O                                     Grape juice  Apple juice Coffee or tea                        Jell-O                                      Popsicle                                                Coffee or tea                        Coffee or tea  _____________________________________________________________________

## 2013-10-28 MED ORDER — GENTAMICIN SULFATE 40 MG/ML IJ SOLN
340.0000 mg | INTRAMUSCULAR | Status: AC
  Administered 2013-10-29: 340 mg via INTRAVENOUS
  Filled 2013-10-28: qty 8.5

## 2013-10-29 ENCOUNTER — Encounter (HOSPITAL_COMMUNITY)
Admission: RE | Disposition: A | Discharge status: Home / Self Care | Payer: Self-pay | Source: Ambulatory Visit | Attending: Urology

## 2013-10-29 ENCOUNTER — Encounter (HOSPITAL_COMMUNITY): Payer: Self-pay | Admitting: *Deleted

## 2013-10-29 ENCOUNTER — Ambulatory Visit (HOSPITAL_COMMUNITY)
Admission: RE | Admit: 2013-10-29 | Discharge: 2013-10-29 | Disposition: A | Discharge status: Home / Self Care | Payer: Medicare Other | Source: Ambulatory Visit | Attending: Urology | Admitting: Urology

## 2013-10-29 ENCOUNTER — Encounter (HOSPITAL_COMMUNITY): Payer: Medicare Other | Admitting: Anesthesiology

## 2013-10-29 ENCOUNTER — Ambulatory Visit (HOSPITAL_COMMUNITY): Payer: Medicare Other | Admitting: Anesthesiology

## 2013-10-29 DIAGNOSIS — F79 Unspecified intellectual disabilities: Secondary | ICD-10-CM | POA: Diagnosis not present

## 2013-10-29 DIAGNOSIS — N3020 Other chronic cystitis without hematuria: Secondary | ICD-10-CM | POA: Diagnosis not present

## 2013-10-29 DIAGNOSIS — Z87891 Personal history of nicotine dependence: Secondary | ICD-10-CM | POA: Insufficient documentation

## 2013-10-29 DIAGNOSIS — E780 Pure hypercholesterolemia: Secondary | ICD-10-CM | POA: Insufficient documentation

## 2013-10-29 DIAGNOSIS — G40909 Epilepsy, unspecified, not intractable, without status epilepticus: Secondary | ICD-10-CM | POA: Diagnosis not present

## 2013-10-29 DIAGNOSIS — C61 Malignant neoplasm of prostate: Secondary | ICD-10-CM | POA: Diagnosis not present

## 2013-10-29 HISTORY — PX: PROSTATE BIOPSY: SHX241

## 2013-10-29 SURGERY — BIOPSY, PROSTATE, RECTAL APPROACH, WITH US GUIDANCE
Anesthesia: Monitor Anesthesia Care

## 2013-10-29 MED ORDER — ONDANSETRON HCL 4 MG/2ML IJ SOLN
INTRAMUSCULAR | Status: DC | PRN
  Administered 2013-10-29: 4 mg via INTRAVENOUS

## 2013-10-29 MED ORDER — PROMETHAZINE HCL 25 MG/ML IJ SOLN: 6.2500 mg | INTRAMUSCULAR | Status: DC | PRN

## 2013-10-29 MED ORDER — ONDANSETRON HCL 4 MG/2ML IJ SOLN
INTRAMUSCULAR | Status: AC
  Filled 2013-10-29: qty 2

## 2013-10-29 MED ORDER — HYDROMORPHONE HCL 1 MG/ML IJ SOLN: 0.2500 mg | INTRAMUSCULAR | Status: DC | PRN

## 2013-10-29 MED ORDER — LACTATED RINGERS IV SOLN
INTRAVENOUS | Status: DC
  Administered 2013-10-29: 13:00:00 via INTRAVENOUS
  Administered 2013-10-29: 1000 mL via INTRAVENOUS

## 2013-10-29 MED ORDER — MEPERIDINE HCL 50 MG/ML IJ SOLN: 6.2500 mg | INTRAMUSCULAR | Status: DC | PRN

## 2013-10-29 MED ORDER — LIDOCAINE HCL 2 % IJ SOLN
INTRAMUSCULAR | Status: AC
  Filled 2013-10-29: qty 20

## 2013-10-29 MED ORDER — PROPOFOL 10 MG/ML IV BOLUS
INTRAVENOUS | Status: AC
  Filled 2013-10-29: qty 20

## 2013-10-29 MED ORDER — MIDAZOLAM HCL 5 MG/5ML IJ SOLN
INTRAMUSCULAR | Status: DC | PRN
  Administered 2013-10-29: 1 mg via INTRAVENOUS

## 2013-10-29 MED ORDER — OXYCODONE HCL 5 MG/5ML PO SOLN
5.0000 mg | Freq: Once | ORAL | Status: DC | PRN
  Filled 2013-10-29: qty 5

## 2013-10-29 MED ORDER — PROPOFOL INFUSION 10 MG/ML OPTIME
INTRAVENOUS | Status: DC | PRN
  Administered 2013-10-29: 300 ug/kg/min via INTRAVENOUS

## 2013-10-29 MED ORDER — MIDAZOLAM HCL 2 MG/2ML IJ SOLN
INTRAMUSCULAR | Status: AC
  Filled 2013-10-29: qty 2

## 2013-10-29 MED ORDER — FENTANYL CITRATE 0.05 MG/ML IJ SOLN
INTRAMUSCULAR | Status: AC
  Filled 2013-10-29: qty 2

## 2013-10-29 MED ORDER — OXYCODONE HCL 5 MG PO TABS: 5.0000 mg | ORAL_TABLET | Freq: Once | ORAL | Status: DC | PRN

## 2013-10-29 MED ORDER — FENTANYL CITRATE 0.05 MG/ML IJ SOLN
INTRAMUSCULAR | Status: DC | PRN
Start: 2013-10-29 — End: 2013-10-29
  Administered 2013-10-29: 50 ug via INTRAVENOUS

## 2013-10-29 MED ORDER — LIDOCAINE HCL 2 % EX GEL
CUTANEOUS | Status: DC | PRN
  Administered 2013-10-29: 10

## 2013-10-29 SURGICAL SUPPLY — 24 items
BAG URINE DRAINAGE (UROLOGICAL SUPPLIES) ×1 IMPLANT
BAG URO CATCHER STRL LF (DRAPE) ×1 IMPLANT
CATH FOLEY 3WAY 30CC 22FR (CATHETERS) ×1 IMPLANT
DRAPE CAMERA CLOSED 9X96 (DRAPES) ×1 IMPLANT
ELECT BUTTON HF 24-28F 2 30DE (ELECTRODE) ×1 IMPLANT
ELECT LOOP MED HF 24F 12D (CUTTING LOOP) ×1 IMPLANT
ELECT LOOP MED HF 24F 12D CBL (CLIP) ×1 IMPLANT
ELECT RESECT VAPORIZE 12D CBL (ELECTRODE) ×1 IMPLANT
GLOVE BIOGEL M STRL SZ7.5 (GLOVE) ×2 IMPLANT
GOWN STRL REUS W/TWL XL LVL3 (GOWN DISPOSABLE) ×1 IMPLANT
HOLDER FOLEY CATH W/STRAP (MISCELLANEOUS) IMPLANT
IV NS IRRIG 3000ML ARTHROMATIC (IV SOLUTION) ×2 IMPLANT
KIT ASPIRATION TUBING (SET/KITS/TRAYS/PACK) IMPLANT
MANIFOLD NEPTUNE II (INSTRUMENTS) ×2 IMPLANT
NDL SAFETY ECLIPSE 18X1.5 (NEEDLE) IMPLANT
NDL SPNL 22GX7 QUINCKE BK (NEEDLE) IMPLANT
NEEDLE HYPO 18GX1.5 SHARP (NEEDLE) ×2
NEEDLE SPNL 22GX7 QUINCKE BK (NEEDLE) ×2 IMPLANT
PACK CYSTO (CUSTOM PROCEDURE TRAY) ×1 IMPLANT
PLUG CATH AND CAP STER (CATHETERS) IMPLANT
SYR 30ML LL (SYRINGE) IMPLANT
SYR CONTROL 10ML LL (SYRINGE) ×1 IMPLANT
SYRINGE IRR TOOMEY STRL 70CC (SYRINGE) ×1 IMPLANT
TUBING CONNECTING 10 (TUBING) ×1 IMPLANT

## 2013-10-29 NOTE — Brief Op Note (Signed)
10/29/2013  1:04 PM  PATIENT:  [name]  58 y.o. male  PRE-OPERATIVE DIAGNOSIS:  prostate cancer  POST-OPERATIVE DIAGNOSIS:  prostate cancer  PROCEDURE:  Procedure(s): BIOPSY TRANSRECTAL ULTRASONIC PROSTATE (TUBP) (N/A)  SURGEON:  Surgeon(s) and Role:    * Alexis Frock, MD - Primary  PHYSICIAN ASSISTANT:   ASSISTANTS: Amaryllis Dyke MD   ANESTHESIA:   local and MAC  EBL:     BLOOD ADMINISTERED:none  DRAINS: none   LOCAL MEDICATIONS USED:  LIDOCAINE   SPECIMEN:  Source of Specimen:  12 core standard template prostate biopsy  DISPOSITION OF SPECIMEN:  PATHOLOGY  COUNTS:  YES  TOURNIQUET:  * No tourniquets in log *  DICTATION: .Other Dictation: Dictation Number [id number]  PLAN OF CARE: Discharge to home after PACU  PATIENT DISPOSITION:  PACU - hemodynamically stable.   Delay start of Pharmacological VTE agent (>24hrs) due to surgical blood loss or risk of bleeding: not applicable

## 2013-10-29 NOTE — Anesthesia Postprocedure Evaluation (Signed)
Anesthesia Post Note  Patient: Jeffery [name]  Procedure(s) Performed: Procedure(s) (LRB): BIOPSY TRANSRECTAL ULTRASONIC PROSTATE (TUBP) (N/A)  Anesthesia type: MAC  Patient location: PACU  Post pain: Pain level controlled  Post assessment: Post-op Vital signs reviewed  Last Vitals: BP 115/58  Pulse 58  Temp(Src) 36.3 C (Oral)  Resp 15  Ht 5\' 11"  (1.803 m)  Wt 151 lb (68.493 kg)  BMI 21.07 kg/m2  SpO2 98%  Post vital signs: Reviewed  Level of consciousness: awake  Complications: No apparent anesthesia complications

## 2013-10-29 NOTE — Transfer of Care (Signed)
Immediate Anesthesia Transfer of Care Note  Patient: Jeffery [name]  Procedure(s) Performed: Procedure(s): BIOPSY TRANSRECTAL ULTRASONIC PROSTATE (TUBP) (N/A)  Patient Location: PACU  Anesthesia Type:MAC  Level of Consciousness: awake, sedated and patient cooperative  Airway & Oxygen Therapy: Patient Spontanous Breathing and Patient connected to face mask oxygen  Post-op Assessment: Report given to PACU RN and Post -op Vital signs reviewed and stable  Post vital signs: Reviewed and stable  Complications: No apparent anesthesia complications

## 2013-10-29 NOTE — Discharge Instructions (Signed)
1 - You may have visible blood in urine and bowel movements for several days. This is normal.  2 - Call MD or go to ER for fever >102, severe pain / nausea / vomiting not relieved by medications, or acute change in medical status.  3.  No driving or operating any heavy machinery x 24hrs and also while taking any narcotic pain medication.

## 2013-10-29 NOTE — H&P (Signed)
Jeffery [name] is an 58 y.o. male.    Chief Complaint: Pre-OP Prostate Biopsy  HPI:     1 - Low Risk Prostate Cancer -  prostate cancer 10/2010 PSA 6.47 02/2011 PSA 6.77 --> Gleason 6 in >5% 2 cores; 09/2011 Rebiopsy stable Gleason 6 2 cores --> Surveillance 12/2011 PSA 18.17 08/2012 PSA 4.96 (on finasteride) --> did not comply with surveillance biopsy and lost to follow up.   2 - Lower Urinary Tract Symptoms - prostate volume 65mL at prior biopsy, PVR's on finasteride <158mL previously. Most recently on Avodart daily.   3 - Chronic Cystitis - several UTI's previously including one febrile pyelo episode. CT 2009 w/o stones or hydro. PVR <140mL.  PMH sig for mental retardation (lives with family, can do round the house activites and makes some independent decisions), seizure disorder.   Today Jeffery [name] is seen to proceed with per-protocol surveillance prostate biopsy as he is over  Due and PSA contineus to rise, most recenlty 7.61 on Avodart. He ttok Augmentin as RX'd.    Past Medical History  Diagnosis Date  . Mental retardation 03-20-11    Sister states pt. developed retardation symptoms about age 69,? whether drug related. Start having seizures. Hx. ETOH abuse in past. -"cognitive issues-can not live alone"  . Cancer     PROSTATE CANCER--PROSTATE BIOPSY PLANNED EVERY 6 MONTHS--NO TX PLANNED UNLESS THE CANCER STARTS PROGRESSING-  . Seizures     last one 4-5 months ago- meds daily  ([facility])  . Hypercholesteremia     under control    Past Surgical History  Procedure Laterality Date  . Prostate biopsy  03/27/2011    Procedure: BIOPSY TRANSRECTAL ULTRASONIC PROSTATE (TUBP);  Surgeon: Milford Cage, MD;  Location: [facility];  Service: Urology;  Laterality: N/A;  Prostatic Block       . Prostate biopsy  09/27/2011    Procedure: BIOPSY TRANSRECTAL ULTRASONIC PROSTATE (TUBP);  Surgeon: Milford Cage, MD;  Location: [facility];  Service: Urology;  Laterality: N/A;         No  family history on file. Social History:  reports that he has quit smoking. His smoking use included Cigarettes. He smoked 0.10 packs per day. He has never used smokeless tobacco. He reports that he does not drink alcohol or use illicit drugs.  Allergies: No Known Allergies  No prescriptions prior to admission    Results for orders placed during the hospital encounter of 10/27/13 (from the past 48 hour(s))  CBC     Status: None   Collection Time    10/27/13  2:00 PM      Result Value Ref Range   WBC 4.8  4.0 - 10.5 K/uL   RBC 4.38  4.22 - 5.81 MIL/uL   Hemoglobin 13.8  13.0 - 17.0 g/dL   HCT 69.1  45.8 - 86.9 %   MCV 95.4  78.0 - 100.0 fL   MCH 31.5  26.0 - 34.0 pg   MCHC 33.0  30.0 - 36.0 g/dL   RDW 19.1  24.4 - 39.0 %   Platelets 172  150 - 400 K/uL  BASIC METABOLIC PANEL     Status: None   Collection Time    10/27/13  2:00 PM      Result Value Ref Range   Sodium 141  137 - 147 mEq/L   Potassium 4.7  3.7 - 5.3 mEq/L   Chloride 103  96 - 112 mEq/L   CO2 29  19 -  32 mEq/L   Glucose, Bld 77  70 - 99 mg/dL   BUN 15  6 - 23 mg/dL   Creatinine, Ser 0.83  0.50 - 1.35 mg/dL   Calcium 9.3  8.4 - 10.5 mg/dL   GFR calc non Af Amer >90  >90 mL/min   GFR calc Af Amer >90  >90 mL/min   Comment: (NOTE)     The eGFR has been calculated using the CKD EPI equation.     This calculation has not been validated in all clinical situations.     eGFR's persistently <90 mL/min signify possible Chronic Kidney     Disease.   Anion gap 9  5 - 15   No results found.  Review of Systems  Constitutional: Negative.  Negative for fever and chills.  HENT: Negative.   Eyes: Negative.   Respiratory: Negative.   Cardiovascular: Negative.   Gastrointestinal: Negative.   Genitourinary: Negative.   Musculoskeletal: Negative.   Skin: Negative.   Neurological: Negative.   Endo/Heme/Allergies: Negative.   Psychiatric/Behavioral: Negative.     There were no vitals taken for this visit. Physical Exam   Constitutional: He appears well-developed.  HENT:  Head: Normocephalic.  Eyes: Pupils are equal, round, and reactive to light.  Neck: Normal range of motion.  Cardiovascular: Normal rate.   Respiratory: Effort normal.  GI: Soft.  Genitourinary: Penis normal.  Musculoskeletal: Normal range of motion.  Neurological: He is alert.  Mild mental retardation. Very pleasant and non-combative.   Skin: Skin is warm.  Psychiatric: He has a normal mood and affect. Judgment normal.     Assessment/Plan   1 - Low Risk Prostate Cancer -  Pt still very young and continued surveillance with at least bi-annual PSA and yearly biopsy recommended for his low risk disease.   The patient has chosen to proceed with biopsy. We then rediscussed the risks (pain, invasive procedure, bleeding, false positive/negative, serious / life-threatening infection) and benefits (reassurance, better confirmation of diagnosis) as well as the fact that additional biopsies or imaging may be recommended at a later time if future PSA values are worrisome. We restressed the importance of adherence to the enemas and antibiotics as prescribed as well as the possibility of day-of-procedure IM antibiotic shot.    2 - Lower Urinary Tract Symptoms - continue Avodart daily.   3 - Chronic Cystitis - prior eval w/o modifiable factors, observe.     Jeffery [name] 10/29/2013, 7:06 AM

## 2013-10-29 NOTE — Anesthesia Preprocedure Evaluation (Signed)
Anesthesia Evaluation  Patient identified by MRN, date of birth, ID band Patient awake  General Assessment Comment:Pt with MR  Reviewed: Allergy & Precautions, H&P , NPO status , Patient's Chart, lab work & pertinent test results  Airway Mallampati: II TM Distance: >3 FB Neck ROM: Full    Dental no notable dental hx. (+) Dental Advisory Given   Pulmonary Current Smoker, former smoker,  breath sounds clear to auscultation  Pulmonary exam normal       Cardiovascular negative cardio ROS  Rhythm:Regular Rate:Normal     Neuro/Psych Seizures -, Well Controlled,  PSYCHIATRIC DISORDERS Mental retardation negative neurological ROS  negative psych ROS   GI/Hepatic negative GI ROS, Neg liver ROS, (+)     substance abuse (ETOH abuse in past)  alcohol use,   Endo/Other  negative endocrine ROS  Renal/GU negative Renal ROS  negative genitourinary   Musculoskeletal negative musculoskeletal ROS (+)   Abdominal   Peds negative pediatric ROS (+)  Hematology negative hematology ROS (+)   Anesthesia Other Findings   Reproductive/Obstetrics negative OB ROS                           Anesthesia Physical  Anesthesia Plan  ASA: III  Anesthesia Plan: MAC   Post-op Pain Management:    Induction: Intravenous  Airway Management Planned: Simple Face Mask  Additional Equipment:   Intra-op Plan:   Post-operative Plan:   Informed Consent: I have reviewed the patients History and Physical, chart, labs and discussed the procedure including the risks, benefits and alternatives for the proposed anesthesia with the patient or authorized representative who has indicated his/her understanding and acceptance.   Dental advisory given  Plan Discussed with: CRNA  Anesthesia Plan Comments:         Anesthesia Quick Evaluation

## 2013-10-30 NOTE — Op Note (Signed)
NAME:  Jeffery [name], Jeffery [name] NO.:  [id number]  MEDICAL RECORD NO.:  [id number]  LOCATION:  [facility]                         FACILITY:  [facility]  PHYSICIAN:  Alexis Frock, MD     DATE OF BIRTH:  1955/07/10  DATE OF PROCEDURE: 10/29/2013 DATE OF DISCHARGE:  10/29/2013                              OPERATIVE REPORT   DIAGNOSIS:  Prostate cancer on active surveillance.  PROCEDURE:  Ultrasound-guided biopsy of the prostate.  ESTIMATED BLOOD LOSS:  Less than 10 mL.  COMPLICATIONS:  None.  SPECIMENS:  Twelve-core prostate biopsy, each core sent separately as per below for permanent pathology.  FINDINGS:  Relatively asymmetric bilobar prostatic hypertrophy, right lobe greater than the left.  Prostate dimensions length 4.95 cm, height 3.85 cm, width 5.52 cm for total prostate volume of 55.07 mL.  INDICATION:  [name] is a pleasant 58 year old gentleman with history of mild mental retardation and low risk prostate cancer who had been on active surveillance protocol previously under the care of one of my colleagues who has since relocated.  The patient was lost to follow up for approximately a year but then re-presented for further care.  He was overdue per protocol surveillance re-biopsy.  Options were discussed with the patient and his family and they wished to proceed.  His most recent PSA did show a good persistent elevation.  Given the patient's mild cognitive impairment, the family and patient wished to undergo procedure in the operative suite with anesthesia care.  Informed consent was signed and placed in medical record.  PROCEDURE IN DETAIL:  [name] was verified. Procedure being prostate biopsy was confirmed.  Procedure was carried out.  Time-out was performed.  Intravenous antibiotics were administered.  Monitored anesthesia care was delivered.  The patient was placed into a left side up from lateral decubitus position with his knees at his  chest and his buttocks at the end of the stretcher.  Two operative staff helped hold the patient in this position.  A transrectal ultrasound probe was introduced per rectum and transrectal ultrasound was performed.  Transrectal ultrasound revealed bilobar prostatic hypertrophy with right lobe greater than left.  No hypoechoic lesions or hyperechoic lesions noted.  Again, prostate volume 55 mL with dimensions as per above.  Attention was then directed to the prostate block.  A 2.5 mL of 2% plain lidocaine was instilled at the right lateral base, left lateral base, right lateral apex, and left lateral apex respectively.  Next, prostate biopsies performed using needle core apparatus with 12 separate cores sent as separate specimens for pathology as follows; right lateral base, right lateral mid, right lateral apex, right mid base, right mid mid, right mid apex, left lateral base, left lateral mid, left lateral apex, left mid base, left mid mid, left mid apex. Following these maneuvers, the ultrasound probe was removed.  There was no persistent rectal bleeding.  Procedure was terminated.  The patient was taken to postanesthesia care unit in stable condition.          ______________________________ Alexis Frock, MD     TM/MEDQ  D:  10/29/2013  T:  10/29/2013  Job:  [id number]

## 2013-10-31 ENCOUNTER — Encounter (HOSPITAL_COMMUNITY): Payer: Self-pay | Admitting: Urology

## 2013-11-13 ENCOUNTER — Ambulatory Visit: Payer: Medicare Other | Admitting: Nurse Practitioner

## 2013-11-17 DIAGNOSIS — C61 Malignant neoplasm of prostate: Secondary | ICD-10-CM | POA: Diagnosis not present

## 2013-11-17 DIAGNOSIS — N3020 Other chronic cystitis without hematuria: Secondary | ICD-10-CM | POA: Diagnosis not present

## 2013-11-19 ENCOUNTER — Other Ambulatory Visit: Payer: Self-pay | Admitting: Neurology

## 2013-11-19 ENCOUNTER — Encounter: Payer: Self-pay | Admitting: Nurse Practitioner

## 2013-11-19 ENCOUNTER — Ambulatory Visit (INDEPENDENT_AMBULATORY_CARE_PROVIDER_SITE_OTHER): Payer: Medicare Other | Admitting: Nurse Practitioner

## 2013-11-19 VITALS — BP 107/64 | HR 71 | Ht 70.0 in | Wt 151.2 lb

## 2013-11-19 DIAGNOSIS — R569 Unspecified convulsions: Secondary | ICD-10-CM | POA: Diagnosis not present

## 2013-11-19 DIAGNOSIS — Z5181 Encounter for therapeutic drug level monitoring: Secondary | ICD-10-CM

## 2013-11-19 DIAGNOSIS — F73 Profound intellectual disabilities: Secondary | ICD-10-CM | POA: Diagnosis not present

## 2013-11-19 MED ORDER — PHENYTOIN 50 MG PO CHEW: 50.0000 mg | CHEWABLE_TABLET | Freq: Every day | ORAL | Status: DC

## 2013-11-19 MED ORDER — PHENYTOIN SODIUM EXTENDED 100 MG PO CAPS: 200.0000 mg | ORAL_CAPSULE | Freq: Every day | ORAL | Status: DC

## 2013-11-19 MED ORDER — DIVALPROEX SODIUM ER 500 MG PO TB24: 1500.0000 mg | ORAL_TABLET | Freq: Every day | ORAL | Status: DC
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
## 2013-11-19 NOTE — Patient Instructions (Addendum)
Reviewed labs Renew meds F/U yearly

## 2013-11-20 NOTE — Progress Notes (Signed)
I agree with the assessment and plan as directed by NP .The patient is known to me .   Kalysta Kneisley, MD  

## 2014-02-24 ENCOUNTER — Ambulatory Visit: Payer: Medicare Other | Admitting: Nurse Practitioner

## 2014-02-25 ENCOUNTER — Encounter: Payer: Self-pay | Admitting: Nurse Practitioner

## 2014-07-21 DIAGNOSIS — N3020 Other chronic cystitis without hematuria: Secondary | ICD-10-CM | POA: Diagnosis not present

## 2014-07-21 DIAGNOSIS — C61 Malignant neoplasm of prostate: Secondary | ICD-10-CM | POA: Diagnosis not present

## 2014-08-20 ENCOUNTER — Encounter: Payer: Self-pay | Admitting: Neurology

## 2014-08-20 ENCOUNTER — Ambulatory Visit (INDEPENDENT_AMBULATORY_CARE_PROVIDER_SITE_OTHER): Payer: Medicare Other | Admitting: Neurology

## 2014-08-20 VITALS — BP 122/78 | HR 76 | Resp 20 | Ht 70.0 in | Wt 141.0 lb

## 2014-08-20 DIAGNOSIS — G40309 Generalized idiopathic epilepsy and epileptic syndromes, not intractable, without status epilepticus: Secondary | ICD-10-CM | POA: Diagnosis not present

## 2014-08-20 DIAGNOSIS — Z5181 Encounter for therapeutic drug level monitoring: Secondary | ICD-10-CM | POA: Diagnosis not present

## 2014-08-20 DIAGNOSIS — F71 Moderate intellectual disabilities: Secondary | ICD-10-CM

## 2014-08-20 DIAGNOSIS — G40909 Epilepsy, unspecified, not intractable, without status epilepticus: Secondary | ICD-10-CM

## 2014-08-20 DIAGNOSIS — R569 Unspecified convulsions: Secondary | ICD-10-CM

## 2014-08-20 MED ORDER — PHENYTOIN SODIUM EXTENDED 100 MG PO CAPS: 200.0000 mg | ORAL_CAPSULE | Freq: Every day | ORAL | Status: DC

## 2014-08-20 MED ORDER — PHENYTOIN 50 MG PO CHEW: 50.0000 mg | CHEWABLE_TABLET | Freq: Every day | ORAL | Status: DC

## 2014-08-20 MED ORDER — DIVALPROEX SODIUM ER 500 MG PO TB24: 1500.0000 mg | ORAL_TABLET | Freq: Every day | ORAL | Status: DC

## 2014-08-20 NOTE — Patient Instructions (Signed)
Epilepsy °People with epilepsy have times when they shake and jerk uncontrollably (seizures). This happens when there is a sudden change in brain function. Epilepsy may have many possible causes. Anything that disturbs the normal pattern of brain cell activity can lead to seizures. °HOME CARE  °· Follow your doctor's instructions about driving and safety during normal activities. °· Get enough sleep. °· Only take medicine as told by your doctor. °· Avoid things that you know can cause you to have seizures (triggers). °· Write down when your seizures happen and what you remember about each seizure. Write down anything you think may have caused the seizure to happen. °· Tell the people you live and work with that you have seizures. Make sure they know how to help you. They should: °¨ Cushion your head and body. °¨ Turn you on your side. °¨ Not restrain you. °¨ Not place anything inside your mouth. °¨ Call for local emergency medical help if there is any question about what has happened. °· Keep all follow-up visits with your doctor. This is very important. °GET HELP IF: °· You get an infection or start to feel sick. You may have more seizures when you are sick. °· You are having seizures more often. °· Your seizure pattern is changing. °GET HELP RIGHT AWAY IF:  °· A seizure does not stop after a few seconds or minutes. °· A seizure causes you to have trouble breathing. °· A seizure gives you a very bad headache. °· A seizure makes you unable to speak or use a part of your body. °Document Released: 10/30/2008 Document Revised: 10/23/2012 Document Reviewed: 08/14/2012 °ExitCare® Patient Information ©2015 [employer]. This information is not intended to replace advice given to you by your health care provider. Make sure you discuss any questions you have with your health care provider. ° °
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
## 2014-08-21 LAB — CBC WITH DIFFERENTIAL/PLATELET
BASOS: 0 %
Basophils Absolute: 0 10*3/uL (ref 0.0–0.2)
EOS (ABSOLUTE): 0.2 10*3/uL (ref 0.0–0.4)
Eos: 3 %
HEMATOCRIT: 42 % (ref 37.5–51.0)
Hemoglobin: 14.1 g/dL (ref 12.6–17.7)
IMMATURE GRANS (ABS): 0 10*3/uL (ref 0.0–0.1)
Immature Granulocytes: 0 %
LYMPHS ABS: 1.6 10*3/uL (ref 0.7–3.1)
Lymphs: 28 %
MCH: 32.3 pg (ref 26.6–33.0)
MCHC: 33.6 g/dL (ref 31.5–35.7)
MCV: 96 fL (ref 79–97)
MONOCYTES: 12 %
Monocytes Absolute: 0.7 10*3/uL (ref 0.1–0.9)
NEUTROS ABS: 3.2 10*3/uL (ref 1.4–7.0)
Neutrophils: 57 %
PLATELETS: 187 10*3/uL (ref 150–379)
RBC: 4.37 x10E6/uL (ref 4.14–5.80)
RDW: 14.1 % (ref 12.3–15.4)
WBC: 5.6 10*3/uL (ref 3.4–10.8)

## 2014-08-21 LAB — COMPREHENSIVE METABOLIC PANEL
ALBUMIN: 4.3 g/dL (ref 3.6–4.8)
ALK PHOS: 100 IU/L (ref 39–117)
ALT: 24 IU/L (ref 0–44)
AST: 22 IU/L (ref 0–40)
Albumin/Globulin Ratio: 1.7 (ref 1.1–2.5)
BUN / CREAT RATIO: 22 (ref 10–22)
BUN: 16 mg/dL (ref 8–27)
Bilirubin Total: <0.2 mg/dL (ref 0.0–1.2)
CALCIUM: 9.6 mg/dL (ref 8.6–10.2)
CO2: 22 mmol/L (ref 18–29)
CREATININE: 0.74 mg/dL — AB (ref 0.76–1.27)
Chloride: 103 mmol/L (ref 97–108)
GFR calc non Af Amer: 100 mL/min/{1.73_m2} (ref >59)
GFR, EST AFRICAN AMERICAN: 115 mL/min/{1.73_m2} (ref >59)
GLOBULIN, TOTAL: 2.6 g/dL (ref 1.5–4.5)
GLUCOSE: 105 mg/dL — AB (ref 65–99)
Potassium: 5.1 mmol/L (ref 3.5–5.2)
SODIUM: 145 mmol/L — AB (ref 134–144)
Total Protein: 6.9 g/dL (ref 6.0–8.5)

## 2014-08-24 ENCOUNTER — Telehealth: Payer: Self-pay

## 2014-08-24 NOTE — Telephone Encounter (Signed)
-----   Message from Larey Seat, MD sent at 08/24/2014 10:14 AM EDT ----- Stable metabolic values, please print and mail to his social worker / group home physician.

## 2014-08-24 NOTE — Telephone Encounter (Signed)
Spoke to Mr. Landry Mellow, pt's caregiver, and advised him that pt's labs were stable. He said pt does not reside in a group home, he lives in a private residence with his caregivers.

## 2014-10-16 ENCOUNTER — Other Ambulatory Visit: Payer: Self-pay | Admitting: *Deleted

## 2014-10-16 MED ORDER — PRAVASTATIN SODIUM 40 MG PO TABS: 40.0000 mg | ORAL_TABLET | Freq: Every day | ORAL | Status: DC

## 2014-11-23 ENCOUNTER — Ambulatory Visit: Payer: Medicare Other | Admitting: Nurse Practitioner

## 2014-11-23 ENCOUNTER — Other Ambulatory Visit: Payer: Self-pay

## 2014-11-23 MED ORDER — PHENYTOIN 50 MG PO CHEW: 50.0000 mg | CHEWABLE_TABLET | Freq: Every day | ORAL | Status: DC

## 2015-01-19 ENCOUNTER — Other Ambulatory Visit: Payer: Self-pay | Admitting: *Deleted

## 2015-03-01 ENCOUNTER — Other Ambulatory Visit: Payer: Self-pay | Admitting: *Deleted

## 2015-03-02 ENCOUNTER — Other Ambulatory Visit: Payer: Self-pay | Admitting: *Deleted

## 2015-03-02 MED ORDER — PRAVASTATIN SODIUM 40 MG PO TABS: 40.0000 mg | ORAL_TABLET | Freq: Every day | ORAL | Status: DC

## 2015-03-02 NOTE — Telephone Encounter (Signed)
One month provided. Patient needs appointment for further refills. Thanks!

## 2015-03-02 NOTE — Telephone Encounter (Signed)
LVM to return call to clinic to inform pt of Dr. Teryl Lucy message below. Katharina Caper, Seung Nidiffer D, [place]

## 2015-03-22 NOTE — Telephone Encounter (Signed)
appt made for 03/23/2015. Fleeger, Salome Spotted, CMA

## 2015-03-23 ENCOUNTER — Ambulatory Visit: Payer: Medicare Other | Admitting: Family Medicine

## 2015-04-05 ENCOUNTER — Encounter: Payer: Self-pay | Admitting: Internal Medicine

## 2015-04-07 ENCOUNTER — Ambulatory Visit (INDEPENDENT_AMBULATORY_CARE_PROVIDER_SITE_OTHER): Payer: Medicare Other | Admitting: Family Medicine

## 2015-04-07 ENCOUNTER — Encounter: Payer: Self-pay | Admitting: Family Medicine

## 2015-04-07 DIAGNOSIS — E7800 Pure hypercholesterolemia, unspecified: Secondary | ICD-10-CM | POA: Diagnosis not present

## 2015-04-07 DIAGNOSIS — C61 Malignant neoplasm of prostate: Secondary | ICD-10-CM | POA: Insufficient documentation

## 2015-04-07 MED ORDER — PRAVASTATIN SODIUM 40 MG PO TABS
40.0000 mg | ORAL_TABLET | Freq: Every day | ORAL | Status: DC
Start: 2015-04-07 — End: 2016-05-12

## 2015-04-07 NOTE — Assessment & Plan Note (Signed)
Will refill pravastatin 40mg  daily. Will check cholesterol at next visit. If not well controlled, will switch to higher intensity statin.

## 2015-04-07 NOTE — Progress Notes (Signed)
    Subjective    Jeffery [name] is a 60 y.o. male that presents for a follow-up visit for:   1. Hyperlipidemia: Patient is adherent with Pravastatin 40mg  daily. No myalgias. No other side effects.  Social History  Substance Use Topics  . Smoking status: Former Smoker -- 0.10 packs/day    Types: Cigarettes  . Smokeless tobacco: Never Used     Comment: SNEAKS A SMOKE EVERY NOW AND THEN  . Alcohol Use: No     Comment: none in 15 yrs,past ETOH abuse    No Known Allergies  No orders of the defined types were placed in this encounter.    ROS  Per HPI   Objective   BP 120/78 mmHg  Pulse 90  Temp(Src) 98.7 F (37.1 C) (Oral)  Ht 5\' 10"  (1.778 m)  Wt 139 lb 3.2 oz (63.141 kg)  BMI 19.97 kg/m2  Vital signs reviewed  General: Well appearing, no distress  Assessment and Plan    Hypercholesterolemia Will refill pravastatin 40mg  daily. Will check cholesterol at next visit. If not well controlled, will switch to higher intensity statin.

## 2015-04-07 NOTE — Patient Instructions (Signed)
Thank you for coming to see me today. It was a pleasure. Today we talked about:   Cholesterol medication: I have refilled this  Please make an appointment to see me soon for a physical.  If you have any questions or concerns, please do not hesitate to call the office at [phone].  Sincerely,  Cordelia Poche, MD

## 2015-04-26 ENCOUNTER — Ambulatory Visit (INDEPENDENT_AMBULATORY_CARE_PROVIDER_SITE_OTHER): Payer: Medicare Other | Admitting: Family Medicine

## 2015-04-26 ENCOUNTER — Encounter: Payer: Self-pay | Admitting: Family Medicine

## 2015-04-26 VITALS — BP 110/70 | HR 82 | Temp 98.2°F | Ht 70.0 in | Wt 142.6 lb

## 2015-04-26 DIAGNOSIS — R012 Other cardiac sounds: Secondary | ICD-10-CM

## 2015-04-26 DIAGNOSIS — Z1159 Encounter for screening for other viral diseases: Secondary | ICD-10-CM

## 2015-04-26 DIAGNOSIS — E7800 Pure hypercholesterolemia, unspecified: Secondary | ICD-10-CM | POA: Diagnosis not present

## 2015-04-26 DIAGNOSIS — Z0000 Encounter for general adult medical examination without abnormal findings: Secondary | ICD-10-CM

## 2015-04-26 LAB — LDL CHOLESTEROL, DIRECT: LDL DIRECT: 101 mg/dL (ref <130)

## 2015-04-26 LAB — HEPATITIS C ANTIBODY: HCV Ab: NEGATIVE

## 2015-04-26 NOTE — Patient Instructions (Signed)
Thank you for coming to see me today. It was a pleasure. Today we talked about:   Physical: I am just getting a cholesterol check. Please look out for results in the mail  Abnormal heart sound: I will get an ultrasound of your heart.  Please make an appointment to see [place] in one year or sooner.  If you have any questions or concerns, please do not hesitate to call the office at [phone].  Sincerely,  Cordelia Poche, MD

## 2015-04-26 NOTE — Progress Notes (Signed)
Subjective    Jeffery [name] is a 60 y.o. male that presents for yearly physical exam.   Concerns: None  Goals    None      Past Medical History  Diagnosis Date  . Mental retardation 03-20-11    Sister states pt. developed retardation symptoms about age 65,? whether drug related. Start having seizures. Hx. ETOH abuse in past. -"cognitive issues-can not live alone"  . Cancer ([place])     PROSTATE CANCER--PROSTATE BIOPSY PLANNED EVERY 6 MONTHS--NO TX PLANNED UNLESS THE CANCER STARTS PROGRESSING-  . Hypercholesteremia     under control  . Seizures ([place])     last seizure 10/27/13  grande maul    Past Surgical History  Procedure Laterality Date  . Prostate biopsy  03/27/2011    Procedure: BIOPSY TRANSRECTAL ULTRASONIC PROSTATE (TUBP);  Surgeon: Molli Hazard, MD;  Location: [facility];  Service: Urology;  Laterality: N/A;  Prostatic Block       . Prostate biopsy  09/27/2011    Procedure: BIOPSY TRANSRECTAL ULTRASONIC PROSTATE (TUBP);  Surgeon: Molli Hazard, MD;  [facility];  Service: Urology;  Laterality: N/A;       . Prostate biopsy N/A 10/29/2013    Procedure: BIOPSY TRANSRECTAL ULTRASONIC PROSTATE (TUBP);  Surgeon: Alexis Frock, MD;  [facility]: [facility];  Service: Urology;  Laterality: N/A;    Current Outpatient Prescriptions on File Prior to Visit  Medication Sig Dispense Refill  . AVODART 0.5 MG capsule Take 0.5 mg by mouth every other day. In the am    . divalproex (DEPAKOTE ER) 500 MG 24 hr tablet Take 3 tablets (1,500 mg total) by mouth at bedtime. 270 tablet 3  . Multiple Vitamin (MULTIVITAMIN WITH MINERALS) TABS tablet Take 1 tablet by mouth at bedtime.    . phenytoin (DILANTIN) 100 MG ER capsule Take 2 capsules (200 mg total) by mouth at bedtime. (Name Brand) 180 capsule 3  . phenytoin (DILANTIN) 50 MG tablet Chew 1 tablet (50 mg total) by mouth at bedtime. (Name Brand) 90 tablet 2  . pravastatin (PRAVACHOL) 40 MG tablet Take 1 tablet (40 mg total)  by mouth at bedtime. 90 tablet 3   Current Facility-Administered Medications on File Prior to Visit  Medication Dose Route Frequency Provider Last Rate Last Dose  . gentamicin (GARAMYCIN) 160 mg in dextrose 5 % 50 mL IVPB  160 mg Intravenous Once Rolan Bucco, MD      . sodium phosphate (FLEET) 7-19 GM/118ML enema 1 enema  1 enema Rectal Once Rolan Bucco, MD        No Known Allergies  Social History   Social History  . Marital Status: Single    Spouse Name: N/A  . Number of Children: 0  . Years of Education: N/A   Occupational History  . Disability     Social History Main Topics  . Smoking status: Former Smoker -- 0.10 packs/day    Types: Cigarettes  . Smokeless tobacco: Never Used     Comment: SNEAKS A SMOKE EVERY NOW AND THEN  . Alcohol Use: No     Comment: none in 15 yrs,past ETOH abuse  . Drug Use: No  . Sexual Activity: No   Other Topics Concern  . None   Social History Narrative   Patient is single and lives with his sister and brother-in-law.   Patient does not have any children.   Patient is right-handed.   Patient drinks two cups of coffee daily.  No family history on file.  ROS  Per HPI   Objective   BP 110/70 mmHg  Pulse 82  Temp(Src) 98.2 F (36.8 C) (Oral)  Ht 5\' 10"  (1.778 m)  Wt 142 lb 9.6 oz (64.683 kg)  BMI 20.46 kg/m2  SpO2 97%  General: Well appearing, no distress HEENT:   Head:  Normocephalic  Eyes: Pupils equal and reactive to light/accomodation. Extraocular movements intact bilaterally.  Ears: Tympanic membranes not visualized secondary to cerumen  Nose/Throat: Nares patent bilaterally. Oropharnx clear and moist.  Neck: No cervical adenopathy bilaterally Respiratory/Chest: Clear to auscultation bilaterally. Unlabored work of breathing. No wheezing or rales. Cardiovascular: Regular rate and rhythm. Normal S1 and S2. No heart murmurs present. No extra heart sounds Gastrointestinal: Soft, non-tender, non-distended, no  guarding, no rebound, no masses felt Genitourinary: Deferred    Musculoskeletal: No calf tenderness, thing frame. Neuro: Alert, oriented Dermatologic: No obvious rashes Psychiatric: Flat affect, slowed speech  Assessment and Plan    Hypercholesterolemia - LDL cholesterol, direct  Abnormal heart sounds Possibly secondary to LVH. Will evaluate with echo. Patient is asymptomatic. - Echocardiogram; Future

## 2015-05-13 ENCOUNTER — Ambulatory Visit (HOSPITAL_COMMUNITY): Payer: Medicare Other | Attending: Cardiology

## 2015-05-13 ENCOUNTER — Other Ambulatory Visit: Payer: Self-pay

## 2015-05-13 DIAGNOSIS — I071 Rheumatic tricuspid insufficiency: Secondary | ICD-10-CM | POA: Diagnosis not present

## 2015-05-13 DIAGNOSIS — Z87891 Personal history of nicotine dependence: Secondary | ICD-10-CM | POA: Insufficient documentation

## 2015-05-13 DIAGNOSIS — R012 Other cardiac sounds: Secondary | ICD-10-CM | POA: Insufficient documentation

## 2015-05-13 DIAGNOSIS — E785 Hyperlipidemia, unspecified: Secondary | ICD-10-CM | POA: Insufficient documentation

## 2015-05-13 DIAGNOSIS — I517 Cardiomegaly: Secondary | ICD-10-CM | POA: Diagnosis not present

## 2015-05-28 ENCOUNTER — Encounter: Payer: Self-pay | Admitting: Family Medicine

## 2015-05-28 DIAGNOSIS — I5030 Unspecified diastolic (congestive) heart failure: Secondary | ICD-10-CM | POA: Insufficient documentation

## 2015-08-23 ENCOUNTER — Ambulatory Visit: Payer: Medicare Other | Admitting: Nurse Practitioner

## 2015-08-24 ENCOUNTER — Encounter: Payer: Self-pay | Admitting: Nurse Practitioner

## 2015-08-25 ENCOUNTER — Ambulatory Visit (INDEPENDENT_AMBULATORY_CARE_PROVIDER_SITE_OTHER): Payer: Medicare Other | Admitting: Nurse Practitioner

## 2015-08-25 ENCOUNTER — Encounter: Payer: Self-pay | Admitting: Nurse Practitioner

## 2015-08-25 VITALS — BP 116/62 | HR 80 | Ht 70.0 in | Wt 144.2 lb

## 2015-08-25 DIAGNOSIS — R5601 Complex febrile convulsions: Secondary | ICD-10-CM

## 2015-08-25 DIAGNOSIS — F70 Mild intellectual disabilities: Secondary | ICD-10-CM

## 2015-08-25 DIAGNOSIS — Z5181 Encounter for therapeutic drug level monitoring: Secondary | ICD-10-CM | POA: Diagnosis not present
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
## 2015-08-25 NOTE — Patient Instructions (Addendum)
Continue Dilantin and Depakote, will refill Check labs today, CBC, CMP VPA and Dilantin. Call for seizure activity F/U yearly and prn

## 2015-08-26 ENCOUNTER — Telehealth: Payer: Self-pay | Admitting: Nurse Practitioner

## 2015-08-26 ENCOUNTER — Ambulatory Visit: Payer: Medicare Other | Admitting: Nurse Practitioner

## 2015-08-26 LAB — COMPREHENSIVE METABOLIC PANEL
ALT: 18 IU/L (ref 0–44)
AST: 18 IU/L (ref 0–40)
Albumin/Globulin Ratio: 1.5 (ref 1.2–2.2)
Albumin: 4.3 g/dL (ref 3.6–4.8)
Alkaline Phosphatase: 78 IU/L (ref 39–117)
BILIRUBIN TOTAL: 0.2 mg/dL (ref 0.0–1.2)
BUN/Creatinine Ratio: 15 (ref 10–24)
BUN: 14 mg/dL (ref 8–27)
CALCIUM: 9.7 mg/dL (ref 8.6–10.2)
CHLORIDE: 103 mmol/L (ref 96–106)
CO2: 23 mmol/L (ref 18–29)
Creatinine, Ser: 0.91 mg/dL (ref 0.76–1.27)
GFR calc Af Amer: 104 mL/min/{1.73_m2} (ref >59)
GFR calc non Af Amer: 90 mL/min/{1.73_m2} (ref >59)
GLOBULIN, TOTAL: 2.9 g/dL (ref 1.5–4.5)
Glucose: 91 mg/dL (ref 65–99)
POTASSIUM: 4.6 mmol/L (ref 3.5–5.2)
Sodium: 148 mmol/L — ABNORMAL HIGH (ref 134–144)
Total Protein: 7.2 g/dL (ref 6.0–8.5)

## 2015-08-26 LAB — CBC WITH DIFFERENTIAL/PLATELET
BASOS ABS: 0 10*3/uL (ref 0.0–0.2)
BASOS: 0 %
EOS (ABSOLUTE): 0.2 10*3/uL (ref 0.0–0.4)
Eos: 4 %
Hematocrit: 44.7 % (ref 37.5–51.0)
Hemoglobin: 14.9 g/dL (ref 12.6–17.7)
Immature Grans (Abs): 0 10*3/uL (ref 0.0–0.1)
Immature Granulocytes: 0 %
LYMPHS ABS: 1.6 10*3/uL (ref 0.7–3.1)
LYMPHS: 44 %
MCH: 32.3 pg (ref 26.6–33.0)
MCHC: 33.3 g/dL (ref 31.5–35.7)
MCV: 97 fL (ref 79–97)
Monocytes Absolute: 0.2 10*3/uL (ref 0.1–0.9)
Monocytes: 6 %
Neutrophils Absolute: 1.7 10*3/uL (ref 1.4–7.0)
Neutrophils: 46 %
PLATELETS: 196 10*3/uL (ref 150–379)
RBC: 4.62 x10E6/uL (ref 4.14–5.80)
RDW: 13.8 % (ref 12.3–15.4)
WBC: 3.7 10*3/uL (ref 3.4–10.8)

## 2015-08-26 LAB — VALPROIC ACID LEVEL: VALPROIC ACID LVL: 51 ug/mL (ref 50–100)

## 2015-08-26 LAB — PHENYTOIN LEVEL, TOTAL: PHENYTOIN (DILANTIN), SERUM: 22.6 ug/mL — AB (ref 10.0–20.0)

## 2015-08-26 MED ORDER — DIVALPROEX SODIUM ER 500 MG PO TB24: 1500.0000 mg | ORAL_TABLET | Freq: Every day | ORAL | 11 refills | Status: DC

## 2015-08-26 MED ORDER — PHENYTOIN SODIUM EXTENDED 100 MG PO CAPS: 200.0000 mg | ORAL_CAPSULE | Freq: Every day | ORAL | 3 refills | Status: DC

## 2015-08-26 NOTE — Telephone Encounter (Signed)
Received fax confirmation [phone] for depakote er Marca Ancona).

## 2015-08-26 NOTE — Progress Notes (Signed)
I agree with the assessment and plan as directed by NP .The patient is known to me .   Armoni Depass, MD  

## 2015-08-26 NOTE — Telephone Encounter (Signed)
LMVM for Ravaughn, brother in law , of pt of the message below.   I will call again to make sure received message and to see if questions.

## 2015-08-26 NOTE — Telephone Encounter (Signed)
Please let caregiver know lab results VPA level ok Dilantin a little high. Stop the 50mg  of Dilantin at Bedtime and continue the 200mg  dose of Dilantin.  refilled medicines. Continue same dose of VPA

## 2015-08-26 NOTE — Telephone Encounter (Signed)
I called and spoke to Karlyn Agee, brother in law of pt (ok per DPR).  He did get message.  I reiterated not taking 50mg  dose of dilantin at bedtime but to continue 200mg  dose of dilantin.  Same dose of depakote.   He verbalized understanding.

## 2015-10-20 DIAGNOSIS — C61 Malignant neoplasm of prostate: Secondary | ICD-10-CM | POA: Diagnosis not present

## 2015-10-20 DIAGNOSIS — N3020 Other chronic cystitis without hematuria: Secondary | ICD-10-CM | POA: Diagnosis not present

## 2015-10-28 ENCOUNTER — Telehealth: Payer: Self-pay | Admitting: *Deleted

## 2015-10-28 NOTE — Telephone Encounter (Signed)
Natalie Rph calling from [facility] about pt and his depakote.   He has been getting the generic depakote for the last 2 yrs, she states (and renewing med from this prescription noted BRAND).  Continue with generic?    Has been on Brand name dilantin.

## 2015-10-28 NOTE — Telephone Encounter (Signed)
I called and LMVM for Natalie at [facility] that yes pt should continue on generic depakote that he has been getting.   They are to call back if questions.

## 2015-10-28 NOTE — Telephone Encounter (Signed)
Pharmacist is correct he has been getting generic Depakote according to our history of meds He gets brand dilantin. Please call

## 2015-11-15 ENCOUNTER — Telehealth: Payer: Self-pay | Admitting: Family Medicine

## 2015-11-15 NOTE — Telephone Encounter (Signed)
Spoke to caretaker [name] and scheduled AWV 11/17/15 at 1:30pm Jeffery [name]

## 2015-11-17 ENCOUNTER — Ambulatory Visit (INDEPENDENT_AMBULATORY_CARE_PROVIDER_SITE_OTHER): Payer: Medicare Other | Admitting: *Deleted

## 2015-11-17 ENCOUNTER — Encounter: Payer: Self-pay | Admitting: *Deleted

## 2015-11-17 VITALS — BP 117/74 | HR 67 | Temp 98.2°F | Ht 69.0 in | Wt 147.6 lb

## 2015-11-17 DIAGNOSIS — Z23 Encounter for immunization: Secondary | ICD-10-CM | POA: Diagnosis not present

## 2015-11-17 DIAGNOSIS — Z0000 Encounter for general adult medical examination without abnormal findings: Secondary | ICD-10-CM

## 2015-11-17 DIAGNOSIS — Z114 Encounter for screening for human immunodeficiency virus [HIV]: Secondary | ICD-10-CM | POA: Diagnosis not present

## 2015-11-17 NOTE — Progress Notes (Signed)
Subjective:   Jeffery [name] is a 60 y.o. male who presents with his  caregiver, Macguire, for an Initial Medicare Annual Wellness Visit. All questions were answered by caregiver.  Cardiac Risk Factors include: advanced age (>60men, >76 women);dyslipidemia;smoking/ tobacco exposure;male gender    Objective:    Today's Vitals   11/17/15 1339  BP: 117/74  Pulse: 67  Temp: 98.2 F (36.8 C)  TempSrc: Oral  SpO2: 100%  Weight: 147 lb 9.6 oz (67 kg)  Height: 5\' 9"  (1.753 m)  PainSc: 0-No pain   Body mass index is 21.8 kg/m.  Current Medications (verified) Outpatient Encounter Prescriptions as of 11/17/2015  Medication Sig  . AVODART 0.5 MG capsule Take 0.5 mg by mouth every other day. In the am  . Multiple Vitamin (MULTIVITAMIN WITH MINERALS) TABS tablet Take 1 tablet by mouth at bedtime.  . phenytoin (DILANTIN) 100 MG ER capsule Take 2 capsules (200 mg total) by mouth at bedtime. (Name Brand)  . pravastatin (PRAVACHOL) 40 MG tablet Take 1 tablet (40 mg total) by mouth at bedtime.  . divalproex (DEPAKOTE ER) 500 MG 24 hr tablet Take 3 tablets (1,500 mg total) by mouth at bedtime. BRAND drug   Facility-Administered Encounter Medications as of 11/17/2015  Medication  . gentamicin (GARAMYCIN) 160 mg in dextrose 5 % 50 mL IVPB  . sodium phosphate (FLEET) 7-19 GM/118ML enema 1 enema    Allergies (verified) Review of patient's allergies indicates no known allergies.   History: Past Medical History:  Diagnosis Date  . Cancer ([place])    PROSTATE CANCER--PROSTATE BIOPSY PLANNED EVERY 6 MONTHS--NO TX PLANNED UNLESS THE CANCER STARTS PROGRESSING-  . Hypercholesteremia    under control  . 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 developed retardation symptoms about age 56,? whether drug related. Start having seizures. Hx. ETOH abuse in past. -"cognitive issues-can not live alone"  . Seizures ([place])    last seizure 10/27/13  grande maul   Past Surgical History:  Procedure  Laterality Date  . PROSTATE BIOPSY  03/27/2011   Procedure: BIOPSY TRANSRECTAL ULTRASONIC PROSTATE (TUBP);  Surgeon: Molli Hazard, MD;  [facility];  Service: Urology;  Laterality: N/A;  Prostatic Block       . PROSTATE BIOPSY  09/27/2011   Procedure: BIOPSY TRANSRECTAL ULTRASONIC PROSTATE (TUBP);  Surgeon: Molli Hazard, MD;  [facility];  Service: Urology;  Laterality: N/A;       . PROSTATE BIOPSY N/A 10/29/2013   Procedure: BIOPSY TRANSRECTAL ULTRASONIC PROSTATE (TUBP);  Surgeon: Alexis Frock, MD;  [facility];  Service: Urology;  Laterality: N/A;   Family History  Problem Relation Age of Onset  . Hypertension Sister   . Diabetes Sister   . Hypertension Sister    Social History   Occupational History  . Disability     Social History Main Topics  . Smoking status: Former Smoker    Packs/day: 0.50    Years: 10.00    Types: Cigarettes  . Smokeless tobacco: Never Used     Comment: SNEAKS A SMOKE EVERY NOW AND THEN  . Alcohol use No     Comment: none in 15 yrs,past ETOH abuse Sneaks liquor once every few months  . Drug use: No  . Sexual activity: No   Tobacco Counseling Counseling given: Yes   Activities of Daily Living In your present state of health, do you have any difficulty performing the following activities: 11/17/2015  Hearing? Y  Vision? N  Difficulty concentrating or making decisions? Y  Walking or climbing stairs? N  Dressing or bathing? N  Doing errands, shopping? Y  Preparing Food and eating ? Y  Using the Toilet? N  In the past six months, have you accidently leaked urine? N  Do you have problems with loss of bowel control? N  Managing your Medications? Y  Managing your Finances? Y  Housekeeping or managing your Housekeeping? Y  Some recent data might be hidden   Home Safety:  My home has a working smoke alarm:  Yes X 2           My home throw rugs have been fastened down to the floor or removed:  Fastened down I  have non-slip mats in the bathtub and shower:  Yes         All my home's stairs have railings or bannisters: Two level home with railings.           My home's floors, stairs and hallways are free from clutter, wires and cords:  Yes     I wear seatbelts consistently:  Yes    Immunizations and Health Maintenance Immunization History  Administered Date(s) Administered  . Influenza Whole 11/16/2009  . Pneumococcal Polysaccharide-23 10/02/2012   Health Maintenance Due  Topic Date Due  . HIV Screening  01/20/1968  . TETANUS/TDAP  01/20/1972  . ZOSTAVAX  01/19/2013  . INFLUENZA VACCINE  08/17/2015  HIV drawn today Flu vaccine administered today Patient will obtain TDaP and Zostavax at local pharmacy   Patient Care Team: Janora Norlander, DO as PCP - General (Family Medicine) Alexis Frock, MD as Consulting Physician (Urology) Larey Seat, MD as Consulting Physician (Neurology)  Indicate any recent Medical Services you may have received from other than [facility] providers in the past year (date may be approximate).    Assessment:   This is a routine wellness examination for Jeffery [name].   Hearing/Vision screen  Hearing Screening (Inadequate exam)   125Hz  250Hz  500Hz  1000Hz  2000Hz  3000Hz  4000Hz  6000Hz  8000Hz   Right ear:           Left ear:           Comments: Patient unable to follow commands to complete hearing test due to mental retardation LD Patient saw hearing specialist yesterday for hearing test and for repair of right hearing aid.  Dietary issues and exercise activities discussed: Current Exercise Habits: Home exercise routine, Type of exercise: walking, Time (Minutes): 15, Frequency (Times/Week): 3, Weekly Exercise (Minutes/Week): 45, Exercise limited by: neurologic condition(s);Other - see comments (Mental retardation)  Goals    . Exercise 5x per week (15 min per time)      Depression Screen PHQ 2/9 Scores 11/17/2015 04/26/2015  PHQ - 2 Score 0 0    Fall Risk Fall  Risk  11/17/2015 04/26/2015  Falls in the past year? No No  Risk for fall due to : Impaired balance/gait -   TUG Test:  Done in 7 seconds. Patient used both hands to push out of chair and to sit back down.  Cognitive Function: Mini-Cog  Failed with score 1/5   Screening Tests Health Maintenance  Topic Date Due  . HIV Screening  01/20/1968  . TETANUS/TDAP  01/20/1972  . ZOSTAVAX  01/19/2013  . INFLUENZA VACCINE  08/17/2015  . COLONOSCOPY  01/04/2021  . Hepatitis C Screening  Completed        Plan:     During the course of the visit Jeffery [name] was educated and  counseled about the following appropriate screening and preventive services:   Vaccines to include Pneumoccal, Influenza, Td, Zostavax  Colorectal cancer screening  Cardiovascular disease screening  Diabetes screening  Nutrition counseling  Patient Instructions (the written plan) were given to the patient.   Velora Heckler, RN   11/17/2015

## 2015-11-17 NOTE — Patient Instructions (Signed)
 Fall Prevention in the Home  Falls can cause injuries. They can happen to people of all ages. There are many things you can do to make your home safe and to help prevent falls.  WHAT CAN I DO ON THE OUTSIDE OF MY HOME?  Regularly fix the edges of walkways and driveways and fix any cracks.  Remove anything that might make you trip as you walk through a door, such as a raised step or threshold.  Trim any bushes or trees on the path to your home.  Use bright outdoor lighting.  Clear any walking paths of anything that might make someone trip, such as rocks or tools.  Regularly check to see if handrails are loose or broken. Make sure that both sides of any steps have handrails.  Any raised decks and porches should have guardrails on the edges.  Have any leaves, snow, or ice cleared regularly.  Use sand or salt on walking paths during winter.  Clean up any spills in your garage right away. This includes oil or grease spills. WHAT CAN I DO IN THE BATHROOM?   Use night lights.  Install grab bars by the toilet and in the tub and shower. Do not use towel bars as grab bars.  Use non-skid mats or decals in the tub or shower.  If you need to sit down in the shower, use a plastic, non-slip stool.  Keep the floor dry. Clean up any water that spills on the floor as soon as it happens.  Remove soap buildup in the tub or shower regularly.  Attach bath mats securely with double-sided non-slip rug tape.  Do not have throw rugs and other things on the floor that can make you trip. WHAT CAN I DO IN THE BEDROOM?  Use night lights.  Make sure that you have a light by your bed that is easy to reach.  Do not use any sheets or blankets that are too big for your bed. They should not hang down onto the floor.  Have a firm chair that has side arms. You can use this for support while you get dressed.  Do not have throw rugs and other things on the floor that can make you trip. WHAT CAN I DO  IN THE KITCHEN?  Clean up any spills right away.  Avoid walking on wet floors.  Keep items that you use a lot in easy-to-reach places.  If you need to reach something above you, use a strong step stool that has a grab bar.  Keep electrical cords out of the way.  Do not use floor polish or wax that makes floors slippery. If you must use wax, use non-skid floor wax.  Do not have throw rugs and other things on the floor that can make you trip. WHAT CAN I DO WITH MY STAIRS?  Do not leave any items on the stairs.  Make sure that there are handrails on both sides of the stairs and use them. Fix handrails that are broken or loose. Make sure that handrails are as long as the stairways.  Check any carpeting to make sure that it is firmly attached to the stairs. Fix any carpet that is loose or worn.  Avoid having throw rugs at the top or bottom of the stairs. If you do have throw rugs, attach them to the floor with carpet tape.  Make sure that you have a light switch at the top of the stairs and the bottom of the   stairs. If you do not have them, ask someone to add them for you. WHAT ELSE CAN I DO TO HELP PREVENT FALLS?  Wear shoes that:  Do not have high heels.  Have rubber bottoms.  Are comfortable and fit you well.  Are closed at the toe. Do not wear sandals.  If you use a stepladder:  Make sure that it is fully opened. Do not climb a closed stepladder.  Make sure that both sides of the stepladder are locked into place.  Ask someone to hold it for you, if possible.  Clearly mark and make sure that you can see:  Any grab bars or handrails.  First and last steps.  Where the edge of each step is.  Use tools that help you move around (mobility aids) if they are needed. These include:  Canes.  Walkers.  Scooters.  Crutches.  Turn on the lights when you go into a dark area. Replace any light bulbs as soon as they burn out.  Set up your furniture so you have a clear  path. Avoid moving your furniture around.  If any of your floors are uneven, fix them.  If there are any pets around you, be aware of where they are.  Review your medicines with your doctor. Some medicines can make you feel dizzy. This can increase your chance of falling. Ask your doctor what other things that you can do to help prevent falls.   This information is not intended to replace advice given to you by your health care provider. Make sure you discuss any questions you have with your health care provider.   Document Released: 10/29/2008 Document Revised: 05/19/2014 Document Reviewed: 02/06/2014 Elsevier Interactive Patient Education 2016 Elsevier Inc[employer]  Health Maintenance, Male A healthy lifestyle and preventative care can promote health and wellness.  Maintain regular health, dental, and eye exams.  Eat a healthy diet. Foods like vegetables, fruits, whole grains, low-fat dairy products, and lean protein foods contain the nutrients you need and are low in calories. Decrease your intake of foods high in solid fats, added sugars, and salt. Get information about a proper diet from your health care provider, if necessary.  Regular physical exercise is one of the most important things you can do for your health. Most adults should get at least 150 minutes of moderate-intensity exercise (any activity that increases your heart rate and causes you to sweat) each week. In addition, most adults need muscle-strengthening exercises on 2 or more days a week.   Maintain a healthy weight. The body mass index (BMI) is a screening tool to identify possible weight problems. It provides an estimate of body fat based on height and weight. Your health care provider can find your BMI and can help you achieve or maintain a healthy weight. For males 20 years and older:  A BMI below 18.5 is considered underweight.  A BMI of 18.5 to 24.9 is normal.  A BMI of 25 to 29.9 is considered overweight.  A BMI  of 30 and above is considered obese.  Maintain normal blood lipids and cholesterol by exercising and minimizing your intake of saturated fat. Eat a balanced diet with plenty of fruits and vegetables. Blood tests for lipids and cholesterol should begin at age 20 and be repeated every 5 years. If your lipid or cholesterol levels are high, you are over age 50, or you are at high risk for heart disease, you may need your cholesterol levels checked more frequently.Ongoing high lipid   and cholesterol levels should be treated with medicines if diet and exercise are not working.  If you smoke, find out from your health care provider how to quit. If you do not use tobacco, do not start.  Lung cancer screening is recommended for adults aged 55-80 years who are at high risk for developing lung cancer because of a history of smoking. A yearly low-dose CT scan of the lungs is recommended for people who have at least a 30-pack-year history of smoking and are current smokers or have quit within the past 15 years. A pack year of smoking is smoking an average of 1 pack of cigarettes a day for 1 year (for example, a 30-pack-year history of smoking could mean smoking 1 pack a day for 30 years or 2 packs a day for 15 years). Yearly screening should continue until the smoker has stopped smoking for at least 15 years. Yearly screening should be stopped for people who develop a health problem that would prevent them from having lung cancer treatment.  If you choose to drink alcohol, do not have more than 2 drinks per day. One drink is considered to be 12 oz (360 mL) of beer, 5 oz (150 mL) of wine, or 1.5 oz (45 mL) of liquor.  Avoid the use of street drugs. Do not share needles with anyone. Ask for help if you need support or instructions about stopping the use of drugs.  High blood pressure causes heart disease and increases the risk of stroke. High blood pressure is more likely to develop in:  People who have blood  pressure in the end of the normal range (100-139/85-89 mm Hg).  People who are overweight or obese.  People who are African American.  If you are 18-39 years of age, have your blood pressure checked every 3-5 years. If you are 40 years of age or older, have your blood pressure checked every year. You should have your blood pressure measured twice--once when you are at a hospital or clinic, and once when you are not at a hospital or clinic. Record the average of the two measurements. To check your blood pressure when you are not at a hospital or clinic, you can use:  An automated blood pressure machine at a pharmacy.  A home blood pressure monitor.  If you are 45-79 years old, ask your health care provider if you should take aspirin to prevent heart disease.  Diabetes screening involves taking a blood sample to check your fasting blood sugar level. This should be done once every 3 years after age 45 if you are at a normal weight and without risk factors for diabetes. Testing should be considered at a younger age or be carried out more frequently if you are overweight and have at least 1 risk factor for diabetes.  Colorectal cancer can be detected and often prevented. Most routine colorectal cancer screening begins at the age of 50 and continues through age 75. However, your health care provider may recommend screening at an earlier age if you have risk factors for colon cancer. On a yearly basis, your health care provider may provide home test kits to check for hidden blood in the stool. A small camera at the end of a tube may be used to directly examine the colon (sigmoidoscopy or colonoscopy) to detect the earliest forms of colorectal cancer. Talk to your health care provider about this at age 50 when routine screening begins. A direct exam of the colon should be   repeated every 5-10 years through age 75, unless early forms of precancerous polyps or small growths are found.  People who are at an  increased risk for hepatitis B should be screened for this virus. You are considered at high risk for hepatitis B if:  You were born in a country where hepatitis B occurs often. Talk with your health care provider about which countries are considered high risk.  Your parents were born in a high-risk country and you have not received a shot to protect against hepatitis B (hepatitis B vaccine).  You have HIV or AIDS.  You use needles to inject street drugs.  You live with, or have sex with, someone who has hepatitis B.  You are a man who has sex with other men (MSM).  You get hemodialysis treatment.  You take certain medicines for conditions like cancer, organ transplantation, and autoimmune conditions.  Hepatitis C blood testing is recommended for all people born from 1945 through 1965 and any individual with known risk factors for hepatitis C.  Healthy men should no longer receive prostate-specific antigen (PSA) blood tests as part of routine cancer screening. Talk to your health care provider about prostate cancer screening.  Testicular cancer screening is not recommended for adolescents or adult males who have no symptoms. Screening includes self-exam, a health care provider exam, and other screening tests. Consult with your health care provider about any symptoms you have or any concerns you have about testicular cancer.  Practice safe sex. Use condoms and avoid high-risk sexual practices to reduce the spread of sexually transmitted infections (STIs).  You should be screened for STIs, including gonorrhea and chlamydia if:  You are sexually active and are younger than 24 years.  You are older than 24 years, and your health care provider tells you that you are at risk for this type of infection.  Your sexual activity has changed since you were last screened, and you are at an increased risk for chlamydia or gonorrhea. Ask your health care provider if you are at risk.  If you are at  risk of being infected with HIV, it is recommended that you take a prescription medicine daily to prevent HIV infection. This is called pre-exposure prophylaxis (PrEP). You are considered at risk if:  You are a man who has sex with other men (MSM).  You are a heterosexual man who is sexually active with multiple partners.  You take drugs by injection.  You are sexually active with a partner who has HIV.  Talk with your health care provider about whether you are at high risk of being infected with HIV. If you choose to begin PrEP, you should first be tested for HIV. You should then be tested every 3 months for as long as you are taking PrEP.  Use sunscreen. Apply sunscreen liberally and repeatedly throughout the day. You should seek shade when your shadow is shorter than you. Protect yourself by wearing long sleeves, pants, a wide-brimmed hat, and sunglasses year round whenever you are outdoors.  Tell your health care provider of new moles or changes in moles, especially if there is a change in shape or color. Also, tell your health care provider if a mole is larger than the size of a pencil eraser.  A one-time screening for abdominal aortic aneurysm (AAA) and surgical repair of large AAAs by ultrasound is recommended for men aged 65-75 years who are current or former smokers.  Stay current with your vaccines (immunizations).     This information is not intended to replace advice given to you by your health care provider. Make sure you discuss any questions you have with your health care provider.   Document Released: 07/01/2007 Document Revised: 01/23/2014 Document Reviewed: 05/30/2010 Elsevier Interactive Patient Education 2016 Elsevier Inc.  

## 2015-11-18 LAB — HIV ANTIBODY (ROUTINE TESTING W REFLEX): HIV 1&2 Ab, 4th Generation: NONREACTIVE

## 2015-11-22 ENCOUNTER — Encounter: Payer: Self-pay | Admitting: *Deleted

## 2015-11-22 NOTE — Progress Notes (Signed)
I have reviewed this visit and discussed with Lauren Ducatte, RN, BSN, and agree with her documentation.   Ashly M. Gottschalk, DO PGY-3, [facility]     

## 2015-12-12 ENCOUNTER — Encounter (HOSPITAL_COMMUNITY): Payer: Self-pay | Admitting: *Deleted

## 2015-12-12 ENCOUNTER — Inpatient Hospital Stay (HOSPITAL_COMMUNITY)
Admission: EM | Admit: 2015-12-12 | Discharge: 2015-12-16 | DRG: 689 | Disposition: A | Discharge status: Home / Self Care | Payer: Medicare Other | Attending: Internal Medicine | Admitting: Internal Medicine

## 2015-12-12 ENCOUNTER — Emergency Department (HOSPITAL_COMMUNITY): Payer: Medicare Other

## 2015-12-12 DIAGNOSIS — Z87891 Personal history of nicotine dependence: Secondary | ICD-10-CM

## 2015-12-12 DIAGNOSIS — G40909 Epilepsy, unspecified, not intractable, without status epilepticus: Secondary | ICD-10-CM | POA: Diagnosis present

## 2015-12-12 DIAGNOSIS — F73 Profound intellectual disabilities: Secondary | ICD-10-CM | POA: Diagnosis not present

## 2015-12-12 DIAGNOSIS — Z8546 Personal history of malignant neoplasm of prostate: Secondary | ICD-10-CM

## 2015-12-12 DIAGNOSIS — G9341 Metabolic encephalopathy: Secondary | ICD-10-CM | POA: Diagnosis not present

## 2015-12-12 DIAGNOSIS — E876 Hypokalemia: Secondary | ICD-10-CM | POA: Diagnosis present

## 2015-12-12 DIAGNOSIS — R569 Unspecified convulsions: Secondary | ICD-10-CM

## 2015-12-12 DIAGNOSIS — R4182 Altered mental status, unspecified: Secondary | ICD-10-CM | POA: Diagnosis not present

## 2015-12-12 DIAGNOSIS — Z79899 Other long term (current) drug therapy: Secondary | ICD-10-CM

## 2015-12-12 DIAGNOSIS — I5032 Chronic diastolic (congestive) heart failure: Secondary | ICD-10-CM | POA: Diagnosis not present

## 2015-12-12 DIAGNOSIS — I5030 Unspecified diastolic (congestive) heart failure: Secondary | ICD-10-CM | POA: Diagnosis present

## 2015-12-12 DIAGNOSIS — N3000 Acute cystitis without hematuria: Secondary | ICD-10-CM | POA: Diagnosis not present

## 2015-12-12 DIAGNOSIS — Z720 Tobacco use: Secondary | ICD-10-CM | POA: Diagnosis present

## 2015-12-12 DIAGNOSIS — Z87440 Personal history of urinary (tract) infections: Secondary | ICD-10-CM

## 2015-12-12 DIAGNOSIS — N390 Urinary tract infection, site not specified: Secondary | ICD-10-CM | POA: Diagnosis not present

## 2015-12-12 DIAGNOSIS — R000 Tachycardia, unspecified: Secondary | ICD-10-CM | POA: Diagnosis not present

## 2015-12-12 DIAGNOSIS — B961 Klebsiella pneumoniae [K. pneumoniae] as the cause of diseases classified elsewhere: Secondary | ICD-10-CM | POA: Diagnosis present

## 2015-12-12 DIAGNOSIS — Z8249 Family history of ischemic heart disease and other diseases of the circulatory system: Secondary | ICD-10-CM

## 2015-12-12 DIAGNOSIS — D649 Anemia, unspecified: Secondary | ICD-10-CM | POA: Diagnosis not present

## 2015-12-12 LAB — URINE MICROSCOPIC-ADD ON

## 2015-12-12 LAB — URINALYSIS, ROUTINE W REFLEX MICROSCOPIC
GLUCOSE, UA: NEGATIVE mg/dL
Ketones, ur: NEGATIVE mg/dL
Nitrite: POSITIVE — AB
PROTEIN: 100 mg/dL — AB
Specific Gravity, Urine: 1.021 (ref 1.005–1.030)
pH: 6.5 (ref 5.0–8.0)

## 2015-12-12 LAB — COMPREHENSIVE METABOLIC PANEL
ALT: 30 U/L (ref 17–63)
AST: 33 U/L (ref 15–41)
Albumin: 3.7 g/dL (ref 3.5–5.0)
Alkaline Phosphatase: 63 U/L (ref 38–126)
Anion gap: 9 (ref 5–15)
BILIRUBIN TOTAL: 1.7 mg/dL — AB (ref 0.3–1.2)
BUN: 17 mg/dL (ref 6–20)
CO2: 23 mmol/L (ref 22–32)
CREATININE: 1.05 mg/dL (ref 0.61–1.24)
Calcium: 8.8 mg/dL — ABNORMAL LOW (ref 8.9–10.3)
Chloride: 103 mmol/L (ref 101–111)
GFR calc Af Amer: >60 mL/min (ref >60)
GFR calc non Af Amer: >60 mL/min (ref >60)
Glucose, Bld: 110 mg/dL — ABNORMAL HIGH (ref 65–99)
Potassium: 3.6 mmol/L (ref 3.5–5.1)
Sodium: 135 mmol/L (ref 135–145)
TOTAL PROTEIN: 7.4 g/dL (ref 6.5–8.1)

## 2015-12-12 LAB — CBC WITH DIFFERENTIAL/PLATELET
BASOS ABS: 0 10*3/uL (ref 0.0–0.1)
Basophils Relative: 0 %
Eosinophils Absolute: 0 10*3/uL (ref 0.0–0.7)
Eosinophils Relative: 0 %
HEMATOCRIT: 38 % — AB (ref 39.0–52.0)
HEMOGLOBIN: 13 g/dL (ref 13.0–17.0)
LYMPHS PCT: 4 %
Lymphs Abs: 0.8 10*3/uL (ref 0.7–4.0)
MCH: 32.9 pg (ref 26.0–34.0)
MCHC: 34.2 g/dL (ref 30.0–36.0)
MCV: 96.2 fL (ref 78.0–100.0)
MONO ABS: 2.2 10*3/uL — AB (ref 0.1–1.0)
Monocytes Relative: 11 %
NEUTROS ABS: 16.3 10*3/uL — AB (ref 1.7–7.7)
Neutrophils Relative %: 85 %
Platelets: 132 10*3/uL — ABNORMAL LOW (ref 150–400)
RBC: 3.95 MIL/uL — AB (ref 4.22–5.81)
RDW: 13 % (ref 11.5–15.5)
WBC: 19.3 10*3/uL — ABNORMAL HIGH (ref 4.0–10.5)

## 2015-12-12 LAB — LACTIC ACID, PLASMA: Lactic Acid, Venous: 1.1 mmol/L (ref 0.5–1.9)

## 2015-12-12 MED ORDER — SODIUM CHLORIDE 0.9 % IV SOLN: 250.0000 mL | INTRAVENOUS | Status: DC | PRN

## 2015-12-12 MED ORDER — SODIUM CHLORIDE 0.9 % IV BOLUS (SEPSIS)
30.0000 mL/kg | Freq: Once | INTRAVENOUS | Status: AC
  Administered 2015-12-12: 2010 mL via INTRAVENOUS

## 2015-12-12 MED ORDER — PHENYTOIN SODIUM EXTENDED 100 MG PO CAPS
200.0000 mg | ORAL_CAPSULE | Freq: Every day | ORAL | Status: DC
  Administered 2015-12-12 – 2015-12-15 (×4): 200 mg via ORAL
  Filled 2015-12-12 (×4): qty 2

## 2015-12-12 MED ORDER — DEXTROSE 5 % IV SOLN: 1.0000 g | INTRAVENOUS | Status: DC

## 2015-12-12 MED ORDER — DEXTROSE 5 % IV SOLN
1.0000 g | INTRAVENOUS | Status: AC
  Administered 2015-12-13 – 2015-12-15 (×3): 1 g via INTRAVENOUS
  Filled 2015-12-12 (×3): qty 10

## 2015-12-12 MED ORDER — DUTASTERIDE 0.5 MG PO CAPS
0.5000 mg | ORAL_CAPSULE | ORAL | Status: DC
  Administered 2015-12-12 – 2015-12-16 (×3): 0.5 mg via ORAL
  Filled 2015-12-12 (×3): qty 1

## 2015-12-12 MED ORDER — ACETAMINOPHEN 650 MG RE SUPP: 650.0000 mg | Freq: Four times a day (QID) | RECTAL | Status: DC | PRN

## 2015-12-12 MED ORDER — DIVALPROEX SODIUM ER 500 MG PO TB24
1500.0000 mg | ORAL_TABLET | Freq: Every day | ORAL | Status: DC
  Administered 2015-12-12 – 2015-12-15 (×4): 1500 mg via ORAL
  Filled 2015-12-12 (×4): qty 3

## 2015-12-12 MED ORDER — PHENYTOIN SODIUM EXTENDED 100 MG PO CAPS
100.0000 mg | ORAL_CAPSULE | Freq: Once | ORAL | Status: AC
  Administered 2015-12-12: 100 mg via ORAL
  Filled 2015-12-12: qty 1

## 2015-12-12 MED ORDER — ACETAMINOPHEN 325 MG PO TABS
650.0000 mg | ORAL_TABLET | Freq: Four times a day (QID) | ORAL | Status: DC | PRN
  Administered 2015-12-13: 650 mg via ORAL
  Filled 2015-12-12: qty 2

## 2015-12-12 MED ORDER — HEPARIN SODIUM (PORCINE) 5000 UNIT/ML IJ SOLN
5000.0000 [IU] | Freq: Three times a day (TID) | INTRAMUSCULAR | Status: DC
  Administered 2015-12-12 – 2015-12-16 (×12): 5000 [IU] via SUBCUTANEOUS
  Filled 2015-12-12 (×12): qty 1

## 2015-12-12 MED ORDER — SODIUM CHLORIDE 0.9% FLUSH
3.0000 mL | Freq: Two times a day (BID) | INTRAVENOUS | Status: DC
  Administered 2015-12-12 – 2015-12-15 (×6): 3 mL via INTRAVENOUS

## 2015-12-12 MED ORDER — DEXTROSE 5 % IV SOLN
2.0000 g | Freq: Once | INTRAVENOUS | Status: AC
  Administered 2015-12-12: 2 g via INTRAVENOUS
  Filled 2015-12-12: qty 2

## 2015-12-12 MED ORDER — DIVALPROEX SODIUM 500 MG PO DR TAB
500.0000 mg | DELAYED_RELEASE_TABLET | Freq: Once | ORAL | Status: AC
  Administered 2015-12-12: 500 mg via ORAL
  Filled 2015-12-12: qty 1

## 2015-12-12 MED ORDER — SODIUM CHLORIDE 0.9% FLUSH: 3.0000 mL | INTRAVENOUS | Status: DC | PRN

## 2015-12-12 MED ORDER — PRAVASTATIN SODIUM 40 MG PO TABS
40.0000 mg | ORAL_TABLET | Freq: Every day | ORAL | Status: DC
  Administered 2015-12-12 – 2015-12-15 (×4): 40 mg via ORAL
  Filled 2015-12-12 (×4): qty 1

## 2015-12-12 NOTE — H&P (Signed)
History and Physical    Jeffery [name] [id number] DOB: 1955-06-08 DOA: 12/12/2015  PCP: Ronnie Doss, DO  Patient coming from:  Home  Chief Complaint: Polyuria and confusion  HPI: Jeffery [name] is a 60 y.o. male with medical history significant of mental retardation, seizure disorder, history of prostate cancer. Patient presents after he was found to be confused by sister. Patient was unable to hold a conversation like he does and was wobbly. As such sister brought patient in for further evaluation recommendations. The problem was noticed today. The problem has been persistent since onset. The patient is unable to provide detailed history given his MR and confusion. No seizure like activity was reported  ED Course: Patient was found to have leukocytosis of 19.3 with urinalysis positive for UTI. We were subsequently consulted for further evaluation recommendations.  Review of Systems: As per HPI otherwise 10 point review of systems negative.    Past Medical History:  Diagnosis Date  . Cancer ([place])    PROSTATE CANCER--PROSTATE BIOPSY PLANNED EVERY 6 MONTHS--NO TX PLANNED UNLESS THE CANCER STARTS PROGRESSING-  . Hypercholesteremia    under control  . 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 developed retardation symptoms about age 1,? whether drug related. Start having seizures. Hx. ETOH abuse in past. -"cognitive issues-can not live alone"  . Seizures ([place])    last seizure 10/27/13  [place]    Past Surgical History:  Procedure Laterality Date  . PROSTATE BIOPSY  03/27/2011   Procedure: BIOPSY TRANSRECTAL ULTRASONIC PROSTATE (TUBP);  Surgeon: Molli Hazard, MD;  Location: [facility];  Service: Urology;  Laterality: N/A;  Prostatic Block       . PROSTATE BIOPSY  09/27/2011   Procedure: BIOPSY TRANSRECTAL ULTRASONIC PROSTATE (TUBP);  Surgeon: Molli Hazard, MD;  Location: [facility];  Service: Urology;  Laterality: N/A;       . PROSTATE BIOPSY N/A 10/29/2013     Procedure: BIOPSY TRANSRECTAL ULTRASONIC PROSTATE (TUBP);  Surgeon: Alexis Frock, MD;  Location: [facility];  Service: Urology;  Laterality: N/A;     reports that he has quit smoking. His smoking use included Cigarettes. He has a 5.00 pack-year smoking history. He has never used smokeless tobacco. He reports that he does not drink alcohol or use drugs.  No Known Allergies  Family History  Problem Relation Age of Onset  . Hypertension Sister   . Diabetes Sister   . Hypertension Sister     Prior to Admission medications   Medication Sig Start Date End Date Taking? Authorizing Provider  AVODART 0.5 MG capsule Take 0.5 mg by mouth every other day. In the am 09/05/12  Yes Historical Provider, MD  divalproex (DEPAKOTE ER) 500 MG 24 hr tablet Take 3 tablets (1,500 mg total) by mouth at bedtime. BRAND drug 08/26/15  Yes Dennie Bible, NP  Multiple Vitamin (MULTIVITAMIN WITH MINERALS) TABS tablet Take 1 tablet by mouth at bedtime.   Yes Historical Provider, MD  phenytoin (DILANTIN) 100 MG ER capsule Take 2 capsules (200 mg total) by mouth at bedtime. (Name Brand) 08/26/15  Yes Dennie Bible, NP  pravastatin (PRAVACHOL) 40 MG tablet Take 1 tablet (40 mg total) by mouth at bedtime. 04/07/15  Yes Mariel Aloe, MD    Physical Exam: Vitals:   12/12/15 1230 12/12/15 1300 12/12/15 1400 12/12/15 1500  BP: 104/92 129/84 117/79 106/85  Pulse: 96 98 95   Resp: 18 20 22 20   Temp:      TempSrc:  SpO2: 96% 95% 95%   Weight:       Constitutional: NAD, calm, comfortable Vitals:   12/12/15 1230 12/12/15 1300 12/12/15 1400 12/12/15 1500  BP: 104/92 129/84 117/79 106/85  Pulse: 96 98 95   Resp: 18 20 22 20   Temp:      TempSrc:      SpO2: 96% 95% 95%   Weight:       Eyes: PERRL, lids and conjunctivae normal ENMT: Mucous membranes are moist. Posterior pharynx clear of any exudate or lesions.  Neck: normal, supple, no masses, no thyromegaly Respiratory: clear to auscultation  bilaterally, no wheezing, no crackles. Normal respiratory effort. No accessory muscle use.   Cardiovascular: Regular rate and rhythm, no murmurs / rubs / gallops.  Abdomen: no tenderness, no masses palpated. No hepatosplenomegaly. Bowel sounds positive.  Musculoskeletal: no clubbing / cyanosis. No joint deformity upper and lower extremities. Skin: no rashes, lesions, ulcers. No induration, on limited exam. Neurologic: CN 3-12 grossly intact. Sensation intact, Strength 5/5 in all 4.  Psychiatric: . Normal mood and affect.    Labs on Admission: I have personally reviewed following labs and imaging studies  CBC:  Recent Labs Lab 12/12/15 1147  WBC 19.3*  NEUTROABS 16.3*  HGB 13.0  HCT 38.0*  MCV 96.2  PLT [id number]*   Basic Metabolic Panel:  Recent Labs Lab 12/12/15 1147  NA 135  K 3.6  CL 103  CO2 23  GLUCOSE 110*  BUN 17  CREATININE 1.05  CALCIUM 8.8*   GFR: Estimated Creatinine Clearance: 69.1 mL/min (by C-G formula based on SCr of 1.05 mg/dL). Liver Function Tests:  Recent Labs Lab 12/12/15 1147  AST 33  ALT 30  ALKPHOS 63  BILITOT 1.7*  PROT 7.4  ALBUMIN 3.7   No results for input(s): LIPASE, AMYLASE in the last 168 hours. No results for input(s): AMMONIA in the last 168 hours. Coagulation Profile: No results for input(s): INR, PROTIME in the last 168 hours. Cardiac Enzymes: No results for input(s): CKTOTAL, CKMB, CKMBINDEX, TROPONINI in the last 168 hours. BNP (last 3 results) No results for input(s): PROBNP in the last 8760 hours. HbA1C: No results for input(s): HGBA1C in the last 72 hours. CBG: No results for input(s): GLUCAP in the last 168 hours. Lipid Profile: No results for input(s): CHOL, HDL, LDLCALC, TRIG, CHOLHDL, LDLDIRECT in the last 72 hours. Thyroid Function Tests: No results for input(s): TSH, T4TOTAL, FREET4, T3FREE, THYROIDAB in the last 72 hours. Anemia Panel: No results for input(s): VITAMINB12, FOLATE, FERRITIN, TIBC, IRON,  RETICCTPCT in the last 72 hours. Urine analysis:    Component Value Date/Time   COLORURINE AMBER (A) 12/12/2015 1143   APPEARANCEUR CLOUDY (A) 12/12/2015 1143   LABSPEC 1.021 12/12/2015 1143   PHURINE 6.5 12/12/2015 1143   GLUCOSEU NEGATIVE 12/12/2015 1143   HGBUR MODERATE (A) 12/12/2015 1143   HGBUR negative 10/27/2009 1451   BILIRUBINUR SMALL (A) 12/12/2015 1143   BILIRUBINUR NEG 10/02/2012 1539   KETONESUR NEGATIVE 12/12/2015 1143   PROTEINUR 100 (A) 12/12/2015 1143   UROBILINOGEN 1.0 10/02/2012 1539   UROBILINOGEN 0.2 10/27/2009 1451   NITRITE POSITIVE (A) 12/12/2015 1143   LEUKOCYTESUR MODERATE (A) 12/12/2015 1143   Sepsis Labs: !!!!!!!!!!!!!!!!!!!!!!!!!!!!!!!!!!!!!!!!!!!! @LABRCNTIP (procalcitonin:4,lacticidven:4) )No results found for this or any previous visit (from the past 240 hour(s)).   Radiological Exams on Admission: Dg Chest Port 1 View  Result Date: 12/12/2015 CLINICAL DATA:  Altered mental status. EXAM: PORTABLE CHEST 1 VIEW COMPARISON:  Chest radiograph 01/10/2008. FINDINGS: Normal  cardiac and mediastinal contours. Elevation the right hemidiaphragm. No consolidative pulmonary opacities. No pleural effusion or pneumothorax. IMPRESSION: No acute cardiopulmonary process. Electronically Signed   By: Lovey Newcomer M.D.   On: 12/12/2015 12:30    EKG: Independently reviewed. Sinus rhythm with no ST elevations or depressions  Assessment/Plan Active Problems:   Convulsions (HCC)   Tobacco abuse   Congestive heart failure with left ventricular diastolic dysfunction (HCC)   Altered mental status   Metabolic encephalopathy UTI Leukocytosis  Plan: Altered mental status secondary to urinary tract infection. Will obtain urine culture and continue Rocephin. For patient's compulsions we'll continue home medication regimen and for tobacco abuse will recommend tobacco cessation. Currently patient is compensated as far as his congestive heart failure goes. Will admit to MedSurg  and adjust antibiotics pending urine culture results. Leukocytosis related to UTI and I will plan on monitoring WBC levels.   DVT prophylaxis: Heparin Code Status: full Family Communication: Discussed with daughter Disposition Plan: Observation Consults called: None Admission status: Alinda Sierras MD [facility] Pager [phone]  If 7PM-7AM, please contact night-coverage [email or web address] Password [facility]  12/12/2015, 4:24 PM

## 2015-12-12 NOTE — ED Provider Notes (Signed)
[facility] DEPT Provider Note   CSN: [id number] Arrival date & time: 12/12/15  1045     History   Chief Complaint Chief Complaint  Patient presents with  . Urinary Tract Infection  . Altered Mental Status    HPI Jeffery [name] is a 60 y.o. male who presents with altered mental status. Past medical history significant for MR, seizure disorder, history of UTI, prostate cancer, CHF with preserved EF. Sister is at bedside and provides history. She states that he normally gets himself dressed to get ready for church on Sundays. She came to pick him up and he was dressed but was off balance and not responding like he normally does. She states he normally gets like this when he has a UTI. She states he was just treated for a UTI 2 weeks ago. He has been urinating frequently and urine has a strong odor. She denies any fever, rigors, shortness of breath, abdominal pain, vomiting. She also notes he normally takes his medicines every day but has not taken any for the past 2 days.  HPI  Past Medical History:  Diagnosis Date  . Cancer ([place])    PROSTATE CANCER--PROSTATE BIOPSY PLANNED EVERY 6 MONTHS--NO TX PLANNED UNLESS THE CANCER STARTS PROGRESSING-  . Hypercholesteremia    under control  . 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 developed retardation symptoms about age 70,? whether drug related. Start having seizures. Hx. ETOH abuse in past. -"cognitive issues-can not live alone"  . Seizures ([place])    last seizure 10/27/13  [place]    Patient Active Problem List   Diagnosis Date Noted  . Congestive heart failure with left ventricular diastolic dysfunction ([place]) 05/28/2015  . Prostate cancer ([place]) 04/07/2015  . Profound intellectual disabilities 11/15/2012  . Encounter for therapeutic drug monitoring 11/15/2012  . Bad odor of urine 10/02/2012  . Routine general medical examination at a health care facility 10/02/2012  . Muscle spasm of back 09/19/2011  . Tobacco abuse  08/17/2011  . Hearing difficulty 08/17/2011  . Hypercholesterolemia 08/17/2011  . MENTAL RETARDATION, MILD 01/30/2008  . Convulsions ([place] ) 01/30/2008  . ELEVATED PROSTATE SPECIFIC ANTIGEN 01/30/2008    Past Surgical History:  Procedure Laterality Date  . PROSTATE BIOPSY  03/27/2011   Procedure: BIOPSY TRANSRECTAL ULTRASONIC PROSTATE (TUBP);  Surgeon: Molli Hazard, MD;  Location: [facility];  Service: Urology;  Laterality: N/A;  Prostatic Block       . PROSTATE BIOPSY  09/27/2011   Procedure: BIOPSY TRANSRECTAL ULTRASONIC PROSTATE (TUBP);  Surgeon: Molli Hazard, MD;  Location: [facility];  Service: Urology;  Laterality: N/A;       . PROSTATE BIOPSY N/A 10/29/2013   Procedure: BIOPSY TRANSRECTAL ULTRASONIC PROSTATE (TUBP);  Surgeon: Alexis Frock, MD;  Location: [facility];  Service: Urology;  Laterality: N/A;       Home Medications    Prior to Admission medications   Medication Sig Start Date End Date Taking? Authorizing Provider  AVODART 0.5 MG capsule Take 0.5 mg by mouth every other day. In the am 09/05/12   Historical Provider, MD  divalproex (DEPAKOTE ER) 500 MG 24 hr tablet Take 3 tablets (1,500 mg total) by mouth at bedtime. BRAND drug 08/26/15   Dennie Bible, NP  Multiple Vitamin (MULTIVITAMIN WITH MINERALS) TABS tablet Take 1 tablet by mouth at bedtime.    Historical Provider, MD  phenytoin (DILANTIN) 100 MG ER capsule Take 2 capsules (200 mg total) by mouth at bedtime. (Name Brand) 08/26/15  Dennie Bible, NP  pravastatin (PRAVACHOL) 40 MG tablet Take 1 tablet (40 mg total) by mouth at bedtime. 04/07/15   Mariel Aloe, MD    Family History Family History  Problem Relation Age of Onset  . Hypertension Sister   . Diabetes Sister   . Hypertension Sister     Social History Social History  Substance Use Topics  . Smoking status: Former Smoker    Packs/day: 0.50    Years: 10.00    Types: Cigarettes  . Smokeless tobacco: Never Used      Comment: SNEAKS A SMOKE EVERY NOW AND THEN  . Alcohol use No     Comment: none in 15 yrs,past ETOH abuse Sneaks liquor once every few months     Allergies   Patient has no known allergies.   Review of Systems Review of Systems  Unable to perform ROS: Mental status change     Physical Exam Updated Vital Signs BP 111/75   Pulse 95   Temp 99.9 F (37.7 C) (Oral)   Resp 16   Wt 67 kg   SpO2 97%   BMI 21.81 kg/m   Physical Exam  Constitutional: He appears well-developed and well-nourished. No distress.  Alert, disoriented. Hard of hearing.  HENT:  Head: Normocephalic and atraumatic.  Eyes: Conjunctivae are normal. Pupils are equal, round, and reactive to light. Right eye exhibits no discharge. Left eye exhibits no discharge. No scleral icterus.  Neck: Normal range of motion.  Cardiovascular: Normal rate and regular rhythm.  Exam reveals no gallop and no friction rub.   No murmur heard. Pulmonary/Chest: Effort normal and breath sounds normal. No respiratory distress. He has no wheezes. He has no rales. He exhibits no tenderness.  Abdominal: Soft. Bowel sounds are normal. He exhibits no distension and no mass. There is no tenderness. There is no rebound and no guarding. No hernia.  Genitourinary:  Genitourinary Comments: Straining to urinate in urinal  Neurological: He is alert.  Lying on stretcher in NAD. GCS 15. Gives confused response. A&O x 2. Cranial nerves II through XII grossly intact. 5/5 strength in all extremities. Sensation fully intact.  Bilateral finger-to-nose intact.    Skin: Skin is warm and dry.  Psychiatric: He has a normal mood and affect. His behavior is normal.  Nursing note and vitals reviewed.    ED Treatments / Results  Labs (all labs ordered are listed, but only abnormal results are displayed) Labs Reviewed  URINALYSIS, ROUTINE W REFLEX MICROSCOPIC (NOT AT [facility]) - Abnormal; Notable for the following:       Result Value   Color, Urine AMBER (*)     APPearance CLOUDY (*)    Hgb urine dipstick MODERATE (*)    Bilirubin Urine SMALL (*)    Protein, ur 100 (*)    Nitrite POSITIVE (*)    Leukocytes, UA MODERATE (*)    All other components within normal limits  CBC WITH DIFFERENTIAL/PLATELET - Abnormal; Notable for the following:    WBC 19.3 (*)    RBC 3.95 (*)    HCT 38.0 (*)    Platelets 132 (*)    Neutro Abs 16.3 (*)    Monocytes Absolute 2.2 (*)    All other components within normal limits  COMPREHENSIVE METABOLIC PANEL - Abnormal; Notable for the following:    Glucose, Bld 110 (*)    Calcium 8.8 (*)    Total Bilirubin 1.7 (*)    All other components within normal limits  URINE MICROSCOPIC-ADD ON - Abnormal; Notable for the following:    Squamous Epithelial / LPF 6-30 (*)    Bacteria, UA MANY (*)    All other components within normal limits  CULTURE, BLOOD (ROUTINE X 2)  CULTURE, BLOOD (ROUTINE X 2)  URINE CULTURE  LACTIC ACID, PLASMA  LACTIC ACID, PLASMA    EKG  EKG Interpretation  Date/Time:  Sunday December 12 2015 12:30:00 EST Ventricular Rate:  94 PR Interval:    QRS Duration: 78 QT Interval:  330 QTC Calculation: 413 R Axis:   41 Text Interpretation:  Sinus rhythm No significant change since last tracing Confirmed by KNAPP  MD-J, JON [phone]) on 12/12/2015 12:49:05 PM       Radiology Dg Chest Port 1 View  Result Date: 12/12/2015 CLINICAL DATA:  Altered mental status. EXAM: PORTABLE CHEST 1 VIEW COMPARISON:  Chest radiograph 01/10/2008. FINDINGS: Normal cardiac and mediastinal contours. Elevation the right hemidiaphragm. No consolidative pulmonary opacities. No pleural effusion or pneumothorax. IMPRESSION: No acute cardiopulmonary process. Electronically Signed   By: Lovey Newcomer M.D.   On: 12/12/2015 12:30    Procedures Procedures (including critical care time)  Medications Ordered in ED Medications  cefTRIAXone (ROCEPHIN) 2 g in dextrose 5 % 50 mL IVPB (not administered)  phenytoin (DILANTIN) ER  capsule 100 mg (100 mg Oral Given 12/12/15 1246)  sodium chloride 0.9 % bolus 2,010 mL (2,010 mLs Intravenous New Bag/Given 12/12/15 1232)  divalproex (DEPAKOTE) DR tablet 500 mg (500 mg Oral Given 12/12/15 1246)     Initial Impression / Assessment and Plan / ED Course  I have reviewed the triage vital signs and the nursing notes.  Pertinent labs & imaging results that were available during my care of the patient were reviewed by me and considered in my medical decision making (see chart for details).  Clinical Course    60 year old male who presents with AMS and UTI. Patient is afebrile, not tachycardic or tachypneic, normotensive, and not hypoxic. CBC remarkable for leukocytosis of 19.3. CMP remarkable for hypocalcemia (8.8), glucose 110, bilirubin 1.7. Lactic acid 1.1. UA is nitrite positive. Culture sent. Rocephin and IVF given. Dose of seizure meds given as well since he has missed these. Spoke with Hillary Bow with hospitalist service who will admit patient due to AMS. Appreciate assistance.  Final Clinical Impressions(s) / ED Diagnoses   Final diagnoses:  Altered mental status, unspecified altered mental status type  Acute cystitis without hematuria    New Prescriptions New Prescriptions   No medications on file     Recardo Evangelist, PA-C 12/12/15 1921    Dorie Rank, MD 12/13/15 1157

## 2015-12-12 NOTE — Progress Notes (Signed)
Pharmacy Antibiotic Note  Eliyah Stadler is a 60 y.o. male admitted on 12/12/2015 with UTI.  Pharmacy has been consulted for ceftriaxone dosing.  Plan: Ceftriaxone 2gm IV x 1 in ED then Ceftriaxone 1gm IV q24h Pharmacy will sign off as does not require renal dosing Please re-consult if needed  Weight: 147 lb 11.3 oz (67 kg)  Temp (24hrs), Avg:99.9 F (37.7 C), Min:99.9 F (37.7 C), Max:99.9 F (37.7 C)   Recent Labs Lab 12/12/15 1147 12/12/15 1307  WBC 19.3*  --   CREATININE 1.05  --   LATICACIDVEN  --  1.1    Estimated Creatinine Clearance: 69.1 mL/min (by C-G formula based on SCr of 1.05 mg/dL).    No Known Allergies  Antimicrobials this admission: 11/26 ceftriaxone >>  Microbiology results: 11/26 BCx: sent 11/26 UCx: sent Thank you for allowing pharmacy to be a part of this patient's care.  Dolly Rias RPh 12/12/2015, 2:03 PM Pager [phone]

## 2015-12-12 NOTE — ED Triage Notes (Signed)
EMS pt states pt lives at home, MR pt, family last saw pt at 0400, got up to go to church, later confused, not norm. Strong smell of urine, #20 L ac, SR 76 BP 118/76. CBG 129. 90% RA 100% 2 L

## 2015-12-12 NOTE — ED Notes (Signed)
Bed: WA06 Expected date:  Expected time:  Means of arrival:  Comments: UTI?

## 2015-12-13 DIAGNOSIS — R569 Unspecified convulsions: Secondary | ICD-10-CM | POA: Diagnosis not present

## 2015-12-13 DIAGNOSIS — G40909 Epilepsy, unspecified, not intractable, without status epilepticus: Secondary | ICD-10-CM | POA: Diagnosis present

## 2015-12-13 DIAGNOSIS — Z8249 Family history of ischemic heart disease and other diseases of the circulatory system: Secondary | ICD-10-CM | POA: Diagnosis not present

## 2015-12-13 DIAGNOSIS — F73 Profound intellectual disabilities: Secondary | ICD-10-CM | POA: Diagnosis present

## 2015-12-13 DIAGNOSIS — D649 Anemia, unspecified: Secondary | ICD-10-CM | POA: Diagnosis present

## 2015-12-13 DIAGNOSIS — Z79899 Other long term (current) drug therapy: Secondary | ICD-10-CM | POA: Diagnosis not present

## 2015-12-13 DIAGNOSIS — Z8546 Personal history of malignant neoplasm of prostate: Secondary | ICD-10-CM | POA: Diagnosis not present

## 2015-12-13 DIAGNOSIS — G9341 Metabolic encephalopathy: Secondary | ICD-10-CM | POA: Diagnosis not present

## 2015-12-13 DIAGNOSIS — N3000 Acute cystitis without hematuria: Secondary | ICD-10-CM | POA: Diagnosis not present

## 2015-12-13 DIAGNOSIS — I5032 Chronic diastolic (congestive) heart failure: Secondary | ICD-10-CM | POA: Diagnosis not present

## 2015-12-13 DIAGNOSIS — Z87440 Personal history of urinary (tract) infections: Secondary | ICD-10-CM | POA: Diagnosis not present

## 2015-12-13 DIAGNOSIS — Z87891 Personal history of nicotine dependence: Secondary | ICD-10-CM | POA: Diagnosis not present

## 2015-12-13 DIAGNOSIS — B961 Klebsiella pneumoniae [K. pneumoniae] as the cause of diseases classified elsewhere: Secondary | ICD-10-CM | POA: Diagnosis present

## 2015-12-13 DIAGNOSIS — Z720 Tobacco use: Secondary | ICD-10-CM | POA: Diagnosis not present

## 2015-12-13 DIAGNOSIS — E876 Hypokalemia: Secondary | ICD-10-CM | POA: Diagnosis present

## 2015-12-13 LAB — CBC
HCT: 37.5 % — ABNORMAL LOW (ref 39.0–52.0)
Hemoglobin: 12.8 g/dL — ABNORMAL LOW (ref 13.0–17.0)
MCH: 32.6 pg (ref 26.0–34.0)
MCHC: 34.1 g/dL (ref 30.0–36.0)
MCV: 95.4 fL (ref 78.0–100.0)
PLATELETS: 155 10*3/uL (ref 150–400)
RBC: 3.93 MIL/uL — AB (ref 4.22–5.81)
RDW: 13.1 % (ref 11.5–15.5)
WBC: 14.8 10*3/uL — ABNORMAL HIGH (ref 4.0–10.5)

## 2015-12-13 LAB — BASIC METABOLIC PANEL
Anion gap: 10 (ref 5–15)
BUN: 13 mg/dL (ref 6–20)
CHLORIDE: 103 mmol/L (ref 101–111)
CO2: 25 mmol/L (ref 22–32)
CREATININE: 0.91 mg/dL (ref 0.61–1.24)
Calcium: 8.3 mg/dL — ABNORMAL LOW (ref 8.9–10.3)
GFR calc Af Amer: >60 mL/min (ref >60)
GFR calc non Af Amer: >60 mL/min (ref >60)
GLUCOSE: 91 mg/dL (ref 65–99)
Potassium: 3.1 mmol/L — ABNORMAL LOW (ref 3.5–5.1)
Sodium: 138 mmol/L (ref 135–145)

## 2015-12-13 LAB — MAGNESIUM: Magnesium: 1.8 mg/dL (ref 1.7–2.4)

## 2015-12-13 MED ORDER — POTASSIUM CHLORIDE CRYS ER 20 MEQ PO TBCR
40.0000 meq | EXTENDED_RELEASE_TABLET | Freq: Two times a day (BID) | ORAL | Status: AC
  Administered 2015-12-13 – 2015-12-14 (×2): 40 meq via ORAL
  Filled 2015-12-13 (×2): qty 2

## 2015-12-13 NOTE — Progress Notes (Signed)
PROGRESS NOTE    Jeffery [name]  [id number] DOB: 10-01-1955 DOA: 12/12/2015 PCP: Ronnie Doss, DO   Brief Narrative: Jeffery [name] is a 60 y.o. male with medical history significant of mental retardation, seizure disorder, history of prostate cancer. Patient presents after he was found to be confused by sister. Patient was found to have leukocytosis of 19.3 with urinalysis positive for UTI. We were subsequently consulted for further evaluation recommendations.   Assessment & Plan:   Active Problems:   Convulsions (HCC)   Tobacco abuse   Congestive heart failure with left ventricular diastolic dysfunction (HCC)   Altered mental status   Metabolic encephalopathy    Acute encephalopathy probably from Urinary tract infection:  - urine cultures pending. Show gram negative rods.  On rocephin. Resume IV fluids.    Chronic diastolic heart failure. : Stable.   Convulsions:  None ths admission.   Hypokalemia: replete as needed.  Magnesium level ordered.   Leukocytosis:  Improving.   Mild normocytic anemia:  Stable.       DVT prophylaxis: (Lovenox/) Code Status: (Full) Family Communication: (called family over the phone.  Disposition Plan: pending PT evaluation.    Consultants:   None.    Procedures:  None.    Antimicrobials: Rocephin. 11/27   Subjective: No new complaints, confused.   Objective: Vitals:   12/12/15 1500 12/12/15 1633 12/12/15 2030 12/13/15 0513  BP: 106/85 109/76 115/62 (!) 106/92  Pulse:  89 96 82  Resp: 20 20 19 19   Temp:  (!) 102.4 F (39.1 C) 99.7 F (37.6 C) 99.2 F (37.3 C)  TempSrc:  Oral Oral Oral  SpO2:  97% 96% 100%  Weight:  64.4 kg (141 lb 15.6 oz)  65.5 kg (144 lb 6.4 oz)    Intake/Output Summary (Last 24 hours) at 12/13/15 1425 Last data filed at 12/13/15 1051  Gross per 24 hour  Intake              480 ml  Output               50 ml  Net              430 ml   Filed Weights   12/12/15 1200 12/12/15 1633  12/13/15 0513  Weight: 67 kg (147 lb 11.3 oz) 64.4 kg (141 lb 15.6 oz) 65.5 kg (144 lb 6.4 oz)    Examination:  General exam: Appears calm and comfortable  Respiratory system: Clear to auscultation. Respiratory effort normal. Cardiovascular system: S1 & S2 heard, RRR. No JVD, murmurs, rubs, gallops or clicks. No pedal edema. Gastrointestinal system: Abdomen is nondistended, soft and nontender. No organomegaly or masses felt. Normal bowel sounds heard. Central nervous system: Alert and not oriented.  Extremities: Symmetric 5 x 5 power. Skin: No rashes, lesions or ulcers     Data Reviewed: I have personally reviewed following labs and imaging studies  CBC:  Recent Labs Lab 12/12/15 1147 12/13/15 0508  WBC 19.3* 14.8*  NEUTROABS 16.3*  --   HGB 13.0 12.8*  HCT 38.0* 37.5*  MCV 96.2 95.4  PLT 132* [id number]   Basic Metabolic Panel:  Recent Labs Lab 12/12/15 1147 12/13/15 0508  NA 135 138  K 3.6 3.1*  CL 103 103  CO2 23 25  GLUCOSE 110* 91  BUN 17 13  CREATININE 1.05 0.91  CALCIUM 8.8* 8.3*   GFR: Estimated Creatinine Clearance: 78 mL/min (by C-G formula based on SCr of 0.91 mg/dL). Liver Function Tests:  Recent  Labs Lab 12/12/15 1147  AST 33  ALT 30  ALKPHOS 63  BILITOT 1.7*  PROT 7.4  ALBUMIN 3.7   No results for input(s): LIPASE, AMYLASE in the last 168 hours. No results for input(s): AMMONIA in the last 168 hours. Coagulation Profile: No results for input(s): INR, PROTIME in the last 168 hours. Cardiac Enzymes: No results for input(s): CKTOTAL, CKMB, CKMBINDEX, TROPONINI in the last 168 hours. BNP (last 3 results) No results for input(s): PROBNP in the last 8760 hours. HbA1C: No results for input(s): HGBA1C in the last 72 hours. CBG: No results for input(s): GLUCAP in the last 168 hours. Lipid Profile: No results for input(s): CHOL, HDL, LDLCALC, TRIG, CHOLHDL, LDLDIRECT in the last 72 hours. Thyroid Function Tests: No results for input(s): TSH,  T4TOTAL, FREET4, T3FREE, THYROIDAB in the last 72 hours. Anemia Panel: No results for input(s): VITAMINB12, FOLATE, FERRITIN, TIBC, IRON, RETICCTPCT in the last 72 hours. Sepsis Labs:  Recent Labs Lab 12/12/15 1307  LATICACIDVEN 1.1    Recent Results (from the past 240 hour(s))  Blood Culture (routine x 2)     Status: None (Preliminary result)   Collection Time: 12/12/15 11:40 AM  Result Value Ref Range Status   Specimen Description BLOOD BLOOD RIGHT FOREARM  Final   Special Requests BOTTLES DRAWN AEROBIC AND ANAEROBIC 5 CC EACH  Final   Culture   Final    NO GROWTH < 24 HOURS Performed at [facility]    Report Status PENDING  Incomplete  Urine culture     Status: Abnormal (Preliminary result)   Collection Time: 12/12/15 11:43 AM  Result Value Ref Range Status   Specimen Description URINE, RANDOM  Final   Special Requests NONE  Final   Culture >=100,000 COLONIES/mL GRAM NEGATIVE RODS (A)  Final   Report Status PENDING  Incomplete  Blood Culture (routine x 2)     Status: None (Preliminary result)   Collection Time: 12/12/15 12:36 PM  Result Value Ref Range Status   Specimen Description BLOOD RIGHT WRIST  Final   Special Requests BOTTLES DRAWN AEROBIC AND ANAEROBIC 5CC EACH  Final   Culture   Final    NO GROWTH < 24 HOURS Performed at [facility]    Report Status PENDING  Incomplete         Radiology Studies: Dg Chest Port 1 View  Result Date: 12/12/2015 CLINICAL DATA:  Altered mental status. EXAM: PORTABLE CHEST 1 VIEW COMPARISON:  Chest radiograph 01/10/2008. FINDINGS: Normal cardiac and mediastinal contours. Elevation the right hemidiaphragm. No consolidative pulmonary opacities. No pleural effusion or pneumothorax. IMPRESSION: No acute cardiopulmonary process. Electronically Signed   By: Lovey Newcomer M.D.   On: 12/12/2015 12:30        Scheduled Meds: . cefTRIAXone (ROCEPHIN)  IV  1 g Intravenous Q24H  . divalproex  1,500 mg Oral QHS  .  dutasteride  0.5 mg Oral QODAY  . heparin  5,000 Units Subcutaneous Q8H  . phenytoin  200 mg Oral QHS  . potassium chloride  40 mEq Oral BID  . pravastatin  40 mg Oral QHS  . sodium chloride flush  3 mL Intravenous Q12H   Continuous Infusions:   LOS: 0 days    Time spent: 35 minutes.     Hosie Poisson, MD [facility] Pager [phone]   If 7PM-7AM, please contact night-coverage [email or web address] Password [email or web address] 12/13/2015, 2:25 PM

## 2015-12-14 LAB — CBC WITH DIFFERENTIAL/PLATELET
BASOS ABS: 0 10*3/uL (ref 0.0–0.1)
BASOS PCT: 0 %
EOS PCT: 1 %
Eosinophils Absolute: 0.1 10*3/uL (ref 0.0–0.7)
HCT: 38.1 % — ABNORMAL LOW (ref 39.0–52.0)
Hemoglobin: 12.9 g/dL — ABNORMAL LOW (ref 13.0–17.0)
Lymphocytes Relative: 12 %
Lymphs Abs: 0.7 10*3/uL (ref 0.7–4.0)
MCH: 32.3 pg (ref 26.0–34.0)
MCHC: 33.9 g/dL (ref 30.0–36.0)
MCV: 95.5 fL (ref 78.0–100.0)
MONO ABS: 1 10*3/uL (ref 0.1–1.0)
MONOS PCT: 15 %
Neutro Abs: 4.4 10*3/uL (ref 1.7–7.7)
Neutrophils Relative %: 72 %
PLATELETS: 183 10*3/uL (ref 150–400)
RBC: 3.99 MIL/uL — ABNORMAL LOW (ref 4.22–5.81)
RDW: 13.1 % (ref 11.5–15.5)
WBC: 6.2 10*3/uL (ref 4.0–10.5)

## 2015-12-14 LAB — BASIC METABOLIC PANEL
ANION GAP: 7 (ref 5–15)
BUN: 11 mg/dL (ref 6–20)
CALCIUM: 8.7 mg/dL — AB (ref 8.9–10.3)
CO2: 24 mmol/L (ref 22–32)
CREATININE: 0.86 mg/dL (ref 0.61–1.24)
Chloride: 105 mmol/L (ref 101–111)
GLUCOSE: 96 mg/dL (ref 65–99)
Potassium: 3.7 mmol/L (ref 3.5–5.1)
Sodium: 136 mmol/L (ref 135–145)

## 2015-12-14 LAB — URINE CULTURE: Culture: >=100000 — AB

## 2015-12-14 NOTE — Progress Notes (Signed)
PROGRESS NOTE    Jeffery [name]  [id number] DOB: April 09, 1955 DOA: 12/12/2015 PCP: Ronnie Doss, DO   Brief Narrative: Jeffery [name] is a 60 y.o. male with medical history significant of mental retardation, seizure disorder, history of prostate cancer. Patient presents after he was found to be confused by sister. Patient was found to have leukocytosis of 19.3 with urinalysis positive for UTI. We were subsequently consulted for further evaluation recommendations.   Assessment & Plan:   Active Problems:   Convulsions (HCC)   Tobacco abuse   Congestive heart failure with left ventricular diastolic dysfunction (HCC)   Altered mental status   Metabolic encephalopathy    Acute encephalopathy probably from Urinary tract infection from klebsiella Sensitive to cephalosporins. Plan to discharge pt on vantin.     Chronic diastolic heart failure. : Stable. Appears compensated.   Convulsions:  None this admission.  Resume dilantin and depakote levels.    Hypokalemia: replete as needed.   Magnesium level ordered.  Pending labs today.   Leukocytosis:  Resolved.   Mild normocytic anemia:  Stable.   Persistent fevers overnight.  blood cultures are pending , monitor.   H/o MR:   cognitive issues from the age of 89.  Unknown IQ/ undetermined.    DVT prophylaxis: (Lovenox/) Code Status: (Full) Family Communication: (called family over the phone.  Disposition Plan: pending PT evaluation.    Consultants:   None.    Procedures:  None.    Antimicrobials: Rocephin. 11/27   Subjective: No new complaints, confused.   Objective: Vitals:   12/13/15 0513 12/13/15 1438 12/13/15 2126 12/14/15 0458  BP: (!) 106/92 110/65 106/68 105/68  Pulse: 82 73 77 80  Resp: 19 18 18 18   Temp: 99.2 F (37.3 C) 99.2 F (37.3 C) (!) 101.6 F (38.7 C) 100 F (37.8 C)  TempSrc: Oral Oral Oral Oral  SpO2: 100% 100% 98% 98%  Weight: 65.5 kg (144 lb 6.4 oz)       Intake/Output  Summary (Last 24 hours) at 12/14/15 1312 Last data filed at 12/13/15 2145  Gross per 24 hour  Intake              170 ml  Output              175 ml  Net               -5 ml   Filed Weights   12/12/15 1200 12/12/15 1633 12/13/15 0513  Weight: 67 kg (147 lb 11.3 oz) 64.4 kg (141 lb 15.6 oz) 65.5 kg (144 lb 6.4 oz)    Examination:  General exam: Appears calm and comfortable  Respiratory system: Clear to auscultation. Respiratory effort normal. Cardiovascular system: S1 & S2 heard, RRR. No JVD, murmurs, rubs, gallops or clicks. No pedal edema. Gastrointestinal system: Abdomen is nondistended, soft and nontender. No organomegaly or masses felt. Normal bowel sounds heard. Central nervous system: Alert and not oriented.  Extremities: Symmetric 5 x 5 power. Skin: No rashes, lesions or ulcers     Data Reviewed: I have personally reviewed following labs and imaging studies  CBC:  Recent Labs Lab 12/12/15 1147 12/13/15 0508 12/14/15 1230  WBC 19.3* 14.8* 6.2  NEUTROABS 16.3*  --  4.4  HGB 13.0 12.8* 12.9*  HCT 38.0* 37.5* 38.1*  MCV 96.2 95.4 95.5  PLT 132* 155 [id number]   Basic Metabolic Panel:  Recent Labs Lab 12/12/15 1147 12/13/15 0508  NA 135 138  K 3.6 3.1*  CL 103  103  CO2 23 25  GLUCOSE 110* 91  BUN 17 13  CREATININE 1.05 0.91  CALCIUM 8.8* 8.3*  MG  --  1.8   GFR: Estimated Creatinine Clearance: 78 mL/min (by C-G formula based on SCr of 0.91 mg/dL). Liver Function Tests:  Recent Labs Lab 12/12/15 1147  AST 33  ALT 30  ALKPHOS 63  BILITOT 1.7*  PROT 7.4  ALBUMIN 3.7   No results for input(s): LIPASE, AMYLASE in the last 168 hours. No results for input(s): AMMONIA in the last 168 hours. Coagulation Profile: No results for input(s): INR, PROTIME in the last 168 hours. Cardiac Enzymes: No results for input(s): CKTOTAL, CKMB, CKMBINDEX, TROPONINI in the last 168 hours. BNP (last 3 results) No results for input(s): PROBNP in the last 8760  hours. HbA1C: No results for input(s): HGBA1C in the last 72 hours. CBG: No results for input(s): GLUCAP in the last 168 hours. Lipid Profile: No results for input(s): CHOL, HDL, LDLCALC, TRIG, CHOLHDL, LDLDIRECT in the last 72 hours. Thyroid Function Tests: No results for input(s): TSH, T4TOTAL, FREET4, T3FREE, THYROIDAB in the last 72 hours. Anemia Panel: No results for input(s): VITAMINB12, FOLATE, FERRITIN, TIBC, IRON, RETICCTPCT in the last 72 hours. Sepsis Labs:  Recent Labs Lab 12/12/15 1307  LATICACIDVEN 1.1    Recent Results (from the past 240 hour(s))  Blood Culture (routine x 2)     Status: None (Preliminary result)   Collection Time: 12/12/15 11:40 AM  Result Value Ref Range Status   Specimen Description BLOOD BLOOD RIGHT FOREARM  Final   Special Requests BOTTLES DRAWN AEROBIC AND ANAEROBIC 5 CC EACH  Final   Culture   Final    NO GROWTH 2 DAYS Performed at [facility]    Report Status PENDING  Incomplete  Urine culture     Status: Abnormal   Collection Time: 12/12/15 11:43 AM  Result Value Ref Range Status   Specimen Description URINE, RANDOM  Final   Special Requests NONE  Final   Culture >=100,000 COLONIES/mL KLEBSIELLA PNEUMONIAE (A)  Final   Report Status 12/14/2015 FINAL  Final   Organism ID, Bacteria KLEBSIELLA PNEUMONIAE (A)  Final      Susceptibility   Klebsiella pneumoniae - MIC*    AMPICILLIN >=32 RESISTANT Resistant     CEFAZOLIN <=4 SENSITIVE Sensitive     CEFTRIAXONE <=1 SENSITIVE Sensitive     CIPROFLOXACIN <=0.25 SENSITIVE Sensitive     GENTAMICIN <=1 SENSITIVE Sensitive     IMIPENEM <=0.25 SENSITIVE Sensitive     NITROFURANTOIN 64 INTERMEDIATE Intermediate     TRIMETH/SULFA <=20 SENSITIVE Sensitive     AMPICILLIN/SULBACTAM 4 SENSITIVE Sensitive     PIP/TAZO <=4 SENSITIVE Sensitive     Extended ESBL NEGATIVE Sensitive     * >=100,000 COLONIES/mL KLEBSIELLA PNEUMONIAE  Blood Culture (routine x 2)     Status: None (Preliminary  result)   Collection Time: 12/12/15 12:36 PM  Result Value Ref Range Status   Specimen Description BLOOD RIGHT WRIST  Final   Special Requests BOTTLES DRAWN AEROBIC AND ANAEROBIC 5CC EACH  Final   Culture   Final    NO GROWTH 2 DAYS Performed at [facility]    Report Status PENDING  Incomplete         Radiology Studies: No results found.      Scheduled Meds: . cefTRIAXone (ROCEPHIN)  IV  1 g Intravenous Q24H  . divalproex  1,500 mg Oral QHS  . dutasteride  0.5  mg Oral QODAY  . heparin  5,000 Units Subcutaneous Q8H  . phenytoin  200 mg Oral QHS  . pravastatin  40 mg Oral QHS  . sodium chloride flush  3 mL Intravenous Q12H   Continuous Infusions:   LOS: 1 day    Time spent: 35 minutes.     Hosie Poisson, MD [facility] Pager [phone]   If 7PM-7AM, please contact night-coverage [email or web address] Password [email or web address] 12/14/2015, 1:12 PM

## 2015-12-15 DIAGNOSIS — I5032 Chronic diastolic (congestive) heart failure: Secondary | ICD-10-CM

## 2015-12-15 DIAGNOSIS — Z720 Tobacco use: Secondary | ICD-10-CM

## 2015-12-15 DIAGNOSIS — G9341 Metabolic encephalopathy: Secondary | ICD-10-CM

## 2015-12-15 DIAGNOSIS — N3000 Acute cystitis without hematuria: Secondary | ICD-10-CM | POA: Diagnosis present

## 2015-12-15 LAB — BASIC METABOLIC PANEL
Anion gap: 8 (ref 5–15)
BUN: 8 mg/dL (ref 6–20)
CHLORIDE: 105 mmol/L (ref 101–111)
CO2: 25 mmol/L (ref 22–32)
CREATININE: 0.84 mg/dL (ref 0.61–1.24)
Calcium: 9 mg/dL (ref 8.9–10.3)
GFR calc Af Amer: >60 mL/min (ref >60)
Glucose, Bld: 110 mg/dL — ABNORMAL HIGH (ref 65–99)
Potassium: 3.8 mmol/L (ref 3.5–5.1)
SODIUM: 138 mmol/L (ref 135–145)

## 2015-12-15 MED ORDER — CEFPODOXIME PROXETIL 200 MG PO TABS
200.0000 mg | ORAL_TABLET | Freq: Two times a day (BID) | ORAL | Status: DC
  Administered 2015-12-16: 200 mg via ORAL
  Filled 2015-12-15: qty 1

## 2015-12-15 NOTE — Progress Notes (Signed)
PROGRESS NOTE    Jeffery [name]  [id number] DOB: 1955/10/29 DOA: 12/12/2015 PCP: Ronnie Doss, DO    Brief Narrative:  Sherryl Barters a 60 y.o.malewith medical history significant of mental retardation, seizure disorder, history of prostate cancer. Patient presents after he was found to be confused by sister. Patient was found to have leukocytosis of 19.3 with urinalysis positive for UTI. We were subsequently consulted for further evaluation recommendations  Assessment & Plan:   Principal Problem:   Metabolic encephalopathy Active Problems:   Acute cystitis without hematuria   Convulsions (HCC)   Tobacco abuse   Congestive heart failure with left ventricular diastolic dysfunction (HCC)   Altered mental status  #1 acute metabolic encephalopathy Likely secondary to Klebsiella UTI. Patient with clinical improvement. Fever curve trending down. Patient was on IV Rocephin we'll transition to oral Vantin tomorrow. Follow fever curve. Supportive care.  #2 Klebsiella pneumoniae UTI We'll transition from IV Rocephin to oral Vantin.  #3 chronic diastolic heart failure Stable. Compensated.  #4 convulsions/seizures Stable. Continue Depakote and Dilantin.  #5 hypokalemia Repleted.  #6 leukocytosis Secondary to UTI. Resolved.  #7 fever Likely secondary to problem #2. Fever curve trending down. Blood cultures with no growth to date. Transition from IV Rocephin to oral Vantin and follow.  #8 history of mental retardation Patient with cognitive issues remained 17. Currently stable.   DVT prophylaxis: Heparin Code Status: Full Family Communication: Updated family at bedside. Disposition Plan: Home if remains afebrile 24 hours and tolerating oral intake.   Consultants:   None  Procedures:   Chest x-ray 12/12/2015  Antimicrobials:   IV Rocephin 12/12/2015>>>>> 12/15/2015  Oral Vantin 12/16/2015   Subjective: Patient denies any chest and per no shortness of  breath. No abdominal pain. Clinical improvement.  Objective: Vitals:   12/14/15 1518 12/14/15 2135 12/15/15 0444 12/15/15 1435  BP: 103/69 100/63 (!) 129/91 116/74  Pulse: 74 73 72 67  Resp: 18 18 18 18   Temp: 99.1 F (37.3 C) 100 F (37.8 C) 98.9 F (37.2 C) 98.4 F (36.9 C)  TempSrc: Oral Oral Oral Oral  SpO2: 97% 96% 93% 100%  Weight:        Intake/Output Summary (Last 24 hours) at 12/15/15 2005 Last data filed at 12/15/15 0800  Gross per 24 hour  Intake              480 ml  Output              750 ml  Net             -270 ml   Filed Weights   12/12/15 1200 12/12/15 1633 12/13/15 0513  Weight: 67 kg (147 lb 11.3 oz) 64.4 kg (141 lb 15.6 oz) 65.5 kg (144 lb 6.4 oz)    Examination:  General exam: Appears calm and comfortable  Respiratory system: Clear to auscultation. Respiratory effort normal. Cardiovascular system: S1 & S2 heard, RRR. No JVD, murmurs, rubs, gallops or clicks. No pedal edema. Gastrointestinal system: Abdomen is nondistended, soft and nontender. No organomegaly or masses felt. Normal bowel sounds heard. Central nervous system: Alert and oriented. No focal neurological deficits. Extremities: Symmetric 5 x 5 power. Skin: No rashes, lesions or ulcers Psychiatry: Judgement and insight appear fair to poor. Mood & affect appropriate.     Data Reviewed: I have personally reviewed following labs and imaging studies  CBC:  Recent Labs Lab 12/12/15 1147 12/13/15 0508 12/14/15 1230  WBC 19.3* 14.8* 6.2  NEUTROABS 16.3*  --  4.4  HGB 13.0 12.8* 12.9*  HCT 38.0* 37.5* 38.1*  MCV 96.2 95.4 95.5  PLT 132* 155 [id number]   Basic Metabolic Panel:  Recent Labs Lab 12/12/15 1147 12/13/15 0508 12/14/15 1230 12/15/15 0934  NA 135 138 136 138  K 3.6 3.1* 3.7 3.8  CL 103 103 105 105  CO2 23 25 24 25   GLUCOSE 110* 91 96 110*  BUN 17 13 11 8   CREATININE 1.05 0.91 0.86 0.84  CALCIUM 8.8* 8.3* 8.7* 9.0  MG  --  1.8  --   --    GFR: Estimated Creatinine  Clearance: 84.5 mL/min (by C-G formula based on SCr of 0.84 mg/dL). Liver Function Tests:  Recent Labs Lab 12/12/15 1147  AST 33  ALT 30  ALKPHOS 63  BILITOT 1.7*  PROT 7.4  ALBUMIN 3.7   No results for input(s): LIPASE, AMYLASE in the last 168 hours. No results for input(s): AMMONIA in the last 168 hours. Coagulation Profile: No results for input(s): INR, PROTIME in the last 168 hours. Cardiac Enzymes: No results for input(s): CKTOTAL, CKMB, CKMBINDEX, TROPONINI in the last 168 hours. BNP (last 3 results) No results for input(s): PROBNP in the last 8760 hours. HbA1C: No results for input(s): HGBA1C in the last 72 hours. CBG: No results for input(s): GLUCAP in the last 168 hours. Lipid Profile: No results for input(s): CHOL, HDL, LDLCALC, TRIG, CHOLHDL, LDLDIRECT in the last 72 hours. Thyroid Function Tests: No results for input(s): TSH, T4TOTAL, FREET4, T3FREE, THYROIDAB in the last 72 hours. Anemia Panel: No results for input(s): VITAMINB12, FOLATE, FERRITIN, TIBC, IRON, RETICCTPCT in the last 72 hours. Sepsis Labs:  Recent Labs Lab 12/12/15 1307  LATICACIDVEN 1.1    Recent Results (from the past 240 hour(s))  Blood Culture (routine x 2)     Status: None (Preliminary result)   Collection Time: 12/12/15 11:40 AM  Result Value Ref Range Status   Specimen Description BLOOD BLOOD RIGHT FOREARM  Final   Special Requests BOTTLES DRAWN AEROBIC AND ANAEROBIC 5 CC EACH  Final   Culture   Final    NO GROWTH 3 DAYS Performed at [facility]    Report Status PENDING  Incomplete  Urine culture     Status: Abnormal   Collection Time: 12/12/15 11:43 AM  Result Value Ref Range Status   Specimen Description URINE, RANDOM  Final   Special Requests NONE  Final   Culture >=100,000 COLONIES/mL KLEBSIELLA PNEUMONIAE (A)  Final   Report Status 12/14/2015 FINAL  Final   Organism ID, Bacteria KLEBSIELLA PNEUMONIAE (A)  Final      Susceptibility   Klebsiella pneumoniae -  MIC*    AMPICILLIN >=32 RESISTANT Resistant     CEFAZOLIN <=4 SENSITIVE Sensitive     CEFTRIAXONE <=1 SENSITIVE Sensitive     CIPROFLOXACIN <=0.25 SENSITIVE Sensitive     GENTAMICIN <=1 SENSITIVE Sensitive     IMIPENEM <=0.25 SENSITIVE Sensitive     NITROFURANTOIN 64 INTERMEDIATE Intermediate     TRIMETH/SULFA <=20 SENSITIVE Sensitive     AMPICILLIN/SULBACTAM 4 SENSITIVE Sensitive     PIP/TAZO <=4 SENSITIVE Sensitive     Extended ESBL NEGATIVE Sensitive     * >=100,000 COLONIES/mL KLEBSIELLA PNEUMONIAE  Blood Culture (routine x 2)     Status: None (Preliminary result)   Collection Time: 12/12/15 12:36 PM  Result Value Ref Range Status   Specimen Description BLOOD RIGHT WRIST  Final   Special Requests BOTTLES DRAWN AEROBIC AND ANAEROBIC 5CC EACH  Final   Culture   Final    NO GROWTH 3 DAYS Performed at [facility]    Report Status PENDING  Incomplete         Radiology Studies: No results found.      Scheduled Meds: . [START ON 12/16/2015] cefpodoxime  200 mg Oral Q12H  . divalproex  1,500 mg Oral QHS  . dutasteride  0.5 mg Oral QODAY  . heparin  5,000 Units Subcutaneous Q8H  . phenytoin  200 mg Oral QHS  . pravastatin  40 mg Oral QHS  . sodium chloride flush  3 mL Intravenous Q12H   Continuous Infusions:   LOS: 2 days    Time spent: 35 minutes    Kalinda Romaniello, MD [facility] Pager [phone]  If 7PM-7AM, please contact night-coverage [email or web address] Password [email or web address] 12/15/2015, 8:05 PM

## 2015-12-15 NOTE — Evaluation (Signed)
Physical Therapy Evaluation Patient Details Name: Jeffery [name] MRN: [id number] DOB: 10-21-1955 Today's Date: 12/15/2015   History of Present Illness  Jeffery [name] is a 60 y.o. male with medical history significant of mental retardation, seizure disorder, history of prostate cancer. Patient presents after he was found to be confused by sister. Patient was found to have urinalysis positive for UTI  Clinical Impression  The patient was pleasant and cooperative and followed gestures to mobilize. Ambulated x 200' with r=RW, at times,,not really using it. No family present for  Prior functional level. Pt admitted with above diagnosis. Pt currently with functional limitations due to the deficits listed below (see PT Problem List).  Pt will benefit from skilled PT to increase their independence and safety with mobility to allow discharge to the venue listed below.       Follow Up Recommendations  none    Equipment Recommendations  None recommended by PT    Recommendations for Other Services       Precautions / Restrictions Precautions Precautions: Fall      Mobility  Bed Mobility Overal bed mobility: Needs Assistance Bed Mobility: Supine to Sit;Sit to Supine     Supine to sit: Supervision Sit to supine: Supervision   General bed mobility comments: tactile cues for activity, gestures  Transfers Overall transfer level: Needs assistance Equipment used: Rolling walker (2 wheeled) Transfers: Sit to/from Stand Sit to Stand: Mod assist         General transfer comment: from bed and toilet  Ambulation/Gait Ambulation/Gait assistance: Min assist Ambulation Distance (Feet): 200 Feet Assistive device: Rolling walker (2 wheeled) Gait Pattern/deviations: Step-to pattern;Step-through pattern;Staggering right;Staggering left     General Gait Details: at times patient picked the walker up, at times was not using it.   Stairs            Wheelchair Mobility    Modified Rankin  (Stroke Patients Only)       Balance Overall balance assessment: Needs assistance Sitting-balance support: Feet supported;No upper extremity supported Sitting balance-Leahy Scale: Good     Standing balance support: No upper extremity supported Standing balance-Leahy Scale: Fair                               Pertinent Vitals/Pain Pain Assessment: Faces Faces Pain Scale: No hurt    Home Living Family/patient expects to be discharged to:: Private residence Living Arrangements: Other relatives Available Help at Discharge: Family             Additional Comments: no family present and patient unable to provide    Prior Function           Comments: uncertain,information not available     Hand Dominance        Extremity/Trunk Assessment   Upper Extremity Assessment: Generalized weakness;LUE deficits/detail           Lower Extremity Assessment: RLE deficits/detail;LLE deficits/detail RLE Deficits / Details: ataxic/spastic like movements LLE Deficits / Details: same as right  Cervical / Trunk Assessment: Other exceptions  Communication   Communication: Receptive difficulties;Expressive difficulties  Cognition Arousal/Alertness: Awake/alert Behavior During Therapy: WFL for tasks assessed/performed Overall Cognitive Status: No family/caregiver present to determine baseline cognitive functioning Area of Impairment: Following commands       Following Commands: Follows one step commands inconsistently       General Comments: follows gestures    General Comments      Exercises  Assessment/Plan    PT Assessment Patient needs continued PT services  PT Problem List Decreased strength;Decreased activity tolerance;Decreased balance;Decreased mobility;Decreased cognition;Decreased safety awareness          PT Treatment Interventions Gait training;DME instruction;Functional mobility training;Therapeutic activities;Therapeutic  exercise;Patient/family education    PT Goals (Current goals can be found in the Care Plan section)  Acute Rehab PT Goals PT Goal Formulation: Patient unable to participate in goal setting Time For Goal Achievement: 12/22/15 Potential to Achieve Goals: Good    Frequency Min 3X/week   Barriers to discharge        Co-evaluation               End of Session Equipment Utilized During Treatment: Gait belt Activity Tolerance: Patient tolerated treatment well Patient left: in bed;with call bell/phone within reach;with bed alarm set Nurse Communication: Mobility status         Time: [id number] PT Time Calculation (min) (ACUTE ONLY): 27 min   Charges:   PT Evaluation $PT Eval Low Complexity: 1 Procedure PT Treatments $Gait Training: 8-22 mins   PT G Codes:        Claretha Cooper 12/15/2015, 5:13 PM  Tresa Endo PT [phone]

## 2015-12-16 LAB — BASIC METABOLIC PANEL
Anion gap: 7 (ref 5–15)
BUN: 11 mg/dL (ref 6–20)
CHLORIDE: 106 mmol/L (ref 101–111)
CO2: 26 mmol/L (ref 22–32)
Calcium: 8.9 mg/dL (ref 8.9–10.3)
Creatinine, Ser: 0.8 mg/dL (ref 0.61–1.24)
Glucose, Bld: 94 mg/dL (ref 65–99)
POTASSIUM: 3.9 mmol/L (ref 3.5–5.1)
SODIUM: 139 mmol/L (ref 135–145)

## 2015-12-16 MED ORDER — CEFPODOXIME PROXETIL 200 MG PO TABS: 200.0000 mg | ORAL_TABLET | Freq: Two times a day (BID) | ORAL | 0 refills | Status: AC

## 2015-12-16 NOTE — Progress Notes (Signed)
Date: December 16, 2015 Discharge orders checked for needs. No case management needs present at time of discharge. Rhonda Davis, RN, BSN, CCM   [phone] 

## 2015-12-16 NOTE — Evaluation (Signed)
Occupational Therapy Evaluation Patient Details Name: Vash Prevot MRN: [id number] DOB: December 24, 1955 Today's Date: 12/16/2015    History of Present Illness Kyston Tomita is a 60 y.o. male with medical history significant of mental retardation, seizure disorder, history of prostate cancer. Patient presents after he was found to be confused by sister. Patient was found to have leukocytosis of 19.3 with urinalysis positive for UTI   Clinical Impression   Pt practiced up to the bathroom with the walker and needed cues for safety with all ADL. Unsure if pt had 24/7 supervision at home PTA but recommend pt have 24/7 supervision for d/c home due to safety issues. Will follow on acute to progress ADL independence and safety.    Follow Up Recommendations  No OT follow up;Supervision/Assistance - 24 hour    Equipment Recommendations  None recommended by OT    Recommendations for Other Services       Precautions / Restrictions Precautions Precautions: Fall Restrictions Weight Bearing Restrictions: No      Mobility Bed Mobility Overal bed mobility: Needs Assistance Bed Mobility: Supine to Sit     Supine to sit: Supervision Sit to supine: Supervision   General bed mobility comments: cues for initiating.  Transfers Overall transfer level: Needs assistance Equipment used: Rolling walker (2 wheeled) Transfers: Sit to/from Stand Sit to Stand: Min guard         General transfer comment: cues for technique, hand placement.    Balance     Sitting balance-Leahy Scale: Good       Standing balance-Leahy Scale: Fair                              ADL Overall ADL's : Needs assistance/impaired Eating/Feeding: Set up;Bed level   Grooming: Wash/dry face;Minimal assistance;Standing   Upper Body Bathing: Supervision/ safety;Set up;Sitting   Lower Body Bathing: Minimal assistance;Sit to/from stand   Upper Body Dressing : Set up;Supervision/safety;Sitting   Lower  Body Dressing: Minimal assistance;Sit to/from stand   Toilet Transfer: Minimal assistance;Ambulation;RW   Toileting- Clothing Manipulation and Hygiene: Minimal assistance;Sit to/from stand         General ADL Comments: Pt transferred to the bathroom with the walker but tended to pick up the walker at times and didnt use walker safely overall. He needed increased time to follow commands to step closer ot the sink and also to pick up the washcloth and wet it for washing his face. He tended to wring out the washcloth and spill water over the edge of the sink. Unable to determine from pt if he has 24/7 supervision at home or home set up details.      Vision     Perception     Praxis      Pertinent Vitals/Pain Pain Assessment: Faces Faces Pain Scale: No hurt     Hand Dominance     Extremity/Trunk Assessment Upper Extremity Assessment Upper Extremity Assessment: Generalized weakness           Communication Communication Communication: Receptive difficulties;Expressive difficulties   Cognition Arousal/Alertness: Awake/alert Behavior During Therapy: WFL for tasks assessed/performed Overall Cognitive Status: No family/caregiver present to determine baseline cognitive functioning Area of Impairment: Following commands;Safety/judgement;Awareness       Following Commands: Follows one step commands inconsistently       General Comments: increased time to follow some commands and with responding to questions.   General Comments       Exercises  Shoulder Instructions      Home Living Family/patient expects to be discharged to:: Private residence Living Arrangements: Other relatives Available Help at Discharge: Family                             Additional Comments: no family present and patient unable to provide      Prior Functioning/Environment          Comments: uncertain,information not available        OT Problem List: Decreased  strength;Decreased knowledge of use of DME or AE;Decreased safety awareness   OT Treatment/Interventions: Self-care/ADL training;Patient/family education;Therapeutic activities;DME and/or AE instruction    OT Goals(Current goals can be found in the care plan section) Acute Rehab OT Goals Patient Stated Goal: none stated.  OT Frequency: Min 2X/week   Barriers to D/C:            Co-evaluation              End of Session Equipment Utilized During Treatment: Rolling walker  Activity Tolerance: Patient tolerated treatment well Patient left: in bed;with call bell/phone within reach;with bed alarm set   Time: [id number] OT Time Calculation (min): 16 min Charges:  OT General Charges $OT Visit: 1 Procedure OT Evaluation $OT Eval Low Complexity: 1 Procedure G-Codes:    Jules Schick 12/16/2015, 11:50 AM

## 2015-12-16 NOTE — Discharge Summary (Signed)
Physician Discharge Summary  Jeffery [name] [id number] DOB: 1955-12-04 DOA: 12/12/2015  PCP: Ronnie Doss, DO  Admit date: 12/12/2015 Discharge date: 12/16/2015  Time spent: 60 minutes  Recommendations for Outpatient Follow-up:  1. Follow-up with Ronnie Doss, DO A1 to 2 weeks. On follow-up patient will need a basic metabolic profile done to follow-up on electrolytes and renal function.   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  Diet recommendation: Heart healthy diet  Filed Weights   12/12/15 1200 12/12/15 1633 12/13/15 0513  Weight: 67 kg (147 lb 11.3 oz) 64.4 kg (141 lb 15.6 oz) 65.5 kg (144 lb 6.4 oz)    History of present illness:  Per Dr Ricki Rodriguez is a 60 y.o. male with medical history significant of mental retardation, seizure disorder, history of prostate cancer. Patient presented after he was found to be confused by sister. Patient was unable to hold a conversation like he does and was wobbly. As such sister brought patient in for further evaluation recommendations. The problem was noticed on the day of admission. The problem has been persistent since onset. The patient was unable to provide detailed history given his MR and confusion. No seizure like activity was reported  ED Course: Patient was found to have leukocytosis of 19.3 with urinalysis positive for UTI. We were subsequently consulted for further evaluation recommendations.   Hospital Course:  #1 acute metabolic encephalopathy Patient was admitted with an acute encephalopathy felt secondary to urinary tract infection. Urinalysis which was done was concerning with a UTI. Urine cultures came back positive for Klebsiella pneumonia.  Patient was initially on IV Rocephin. Patient's fever curve trended down. Patient was subsequently transitioned from IV Rocephin to oral Vantin. Patient improved clinically and was close to baseline by day of discharge. Patient will be discharged home on 5 more days of oral Vantin to complete a course of antibiotic treatment.  #2 Klebsiella pneumoniae UTI Urinalysis done on admission and concerning for UTI. Patient was placed on IV Rocephin with improvement with his fever leukocytosis. Patient was subsequently transitioned to [facility] and will be discharged on oral Vantin for 5 more days to complete a one-week course of antibiotic treatment. Patient improved clinically over discharged in stable and improved condition.  #3 chronic diastolic heart failure Stable. Compensated.  #4 convulsions/seizures Stable. Continue Depakote and Dilantin.  #5 hypokalemia Repleted.  #6 leukocytosis Secondary to UTI. Resolved.  #7 fever Likely secondary to problem #2. Fever curve trended down while on IV antibiotics. Blood cultures with no growth to date. Patient was maintained on IV Rocephin and subsequently transitioned to oral Vantin which he tolerated on day of discharge. Patient was afebrile for 24-48 hours.  #8 history of mental retardation Patient with cognitive issues remained 17. Currently stable.    Procedures:  Chest x-ray 12/12/2015  Consultations:  None  Discharge Exam: Vitals:   12/15/15 2013 12/16/15 0623  BP: (!) 153/90 108/73  Pulse: 83 67  Resp: 18 18  Temp: 98.9 F (37.2 C) 98.3 F (36.8 C)    General: NAD Cardiovascular: RRR Respiratory: CTAB anterior lung fields.  Discharge Instructions   Discharge Instructions    Diet - low sodium heart healthy    Complete by:  As directed    Increase activity  slowly    Complete by:  As directed      Current Discharge Medication List    START taking these medications   Details  cefpodoxime (VANTIN) 200 MG tablet  Take 1 tablet (200 mg total) by mouth every 12 (twelve) hours. Take for 5 days then stop. Qty: 10 tablet, Refills: 0      CONTINUE these medications which have NOT CHANGED   Details  AVODART 0.5 MG capsule Take 0.5 mg by mouth every other day. In the am    divalproex (DEPAKOTE ER) 500 MG 24 hr tablet Take 3 tablets (1,500 mg total) by mouth at bedtime. BRAND drug Qty: 90 tablet, Refills: 11    Multiple Vitamin (MULTIVITAMIN WITH MINERALS) TABS tablet Take 1 tablet by mouth at bedtime.    phenytoin (DILANTIN) 100 MG ER capsule Take 2 capsules (200 mg total) by mouth at bedtime. (Name Brand) Qty: 180 capsule, Refills: 3    pravastatin (PRAVACHOL) 40 MG tablet Take 1 tablet (40 mg total) by mouth at bedtime. Qty: 90 tablet, Refills: 3   Associated Diagnoses: Hypercholesterolemia       No Known Allergies Follow-up Information    Ashly Gottschalk, DO. Schedule an appointment as soon as possible for a visit in 2 week(s).   Specialty:  Family Medicine Contact information: [id number] [place] [address] [phone]            The results of significant diagnostics from this hospitalization (including imaging, microbiology, ancillary and laboratory) are listed below for reference.    Significant Diagnostic Studies: Dg Chest Port 1 View  Result Date: 12/12/2015 CLINICAL DATA:  Altered mental status. EXAM: PORTABLE CHEST 1 VIEW COMPARISON:  Chest radiograph 01/10/2008. FINDINGS: Normal cardiac and mediastinal contours. Elevation the right hemidiaphragm. No consolidative pulmonary opacities. No pleural effusion or pneumothorax. IMPRESSION: No acute cardiopulmonary process. Electronically Signed   By: Lovey Newcomer M.D.   On: 12/12/2015 12:30    Microbiology: Recent Results (from the past 240 hour(s))  Blood Culture (routine x 2)     Status: None (Preliminary result)   Collection Time: 12/12/15 11:40 AM  Result Value Ref Range Status   Specimen Description BLOOD BLOOD  RIGHT FOREARM  Final   Special Requests BOTTLES DRAWN AEROBIC AND ANAEROBIC 5 CC EACH  Final   Culture   Final    NO GROWTH 3 DAYS Performed at [facility]    Report Status PENDING  Incomplete  Urine culture     Status: Abnormal   Collection Time: 12/12/15 11:43 AM  Result Value Ref Range Status   Specimen Description URINE, RANDOM  Final   Special Requests NONE  Final   Culture >=100,000 COLONIES/mL KLEBSIELLA PNEUMONIAE (A)  Final   Report Status 12/14/2015 FINAL  Final   Organism ID, Bacteria KLEBSIELLA PNEUMONIAE (A)  Final      Susceptibility   Klebsiella pneumoniae - MIC*    AMPICILLIN >=32 RESISTANT Resistant     CEFAZOLIN <=4 SENSITIVE Sensitive     CEFTRIAXONE <=1 SENSITIVE Sensitive     CIPROFLOXACIN <=0.25 SENSITIVE Sensitive     GENTAMICIN <=1 SENSITIVE Sensitive     IMIPENEM <=0.25 SENSITIVE Sensitive     NITROFURANTOIN 64 INTERMEDIATE Intermediate     TRIMETH/SULFA <=20 SENSITIVE Sensitive     AMPICILLIN/SULBACTAM 4 SENSITIVE Sensitive     PIP/TAZO <=4 SENSITIVE Sensitive     Extended ESBL NEGATIVE Sensitive     * >=100,000 COLONIES/mL KLEBSIELLA PNEUMONIAE  Blood Culture (routine x  2)     Status: None (Preliminary result)   Collection Time: 12/12/15 12:36 PM  Result Value Ref Range Status   Specimen Description BLOOD RIGHT WRIST  Final   Special Requests BOTTLES DRAWN AEROBIC AND ANAEROBIC 5CC EACH  Final   Culture   Final    NO GROWTH 3 DAYS Performed at [facility]    Report Status PENDING  Incomplete     Labs: Basic Metabolic Panel:  Recent Labs Lab 12/12/15 1147 12/13/15 0508 12/14/15 1230 12/15/15 0934 12/16/15 0546  NA 135 138 136 138 139  K 3.6 3.1* 3.7 3.8 3.9  CL 103 103 105 105 106  CO2 23 25 24 25 26   GLUCOSE 110* 91 96 110* 94  BUN 17 13 11 8 11   CREATININE 1.05 0.91 0.86 0.84 0.80  CALCIUM 8.8* 8.3* 8.7* 9.0 8.9  MG  --  1.8  --   --   --    Liver Function Tests:  Recent Labs Lab 12/12/15 1147  AST 33  ALT  30  ALKPHOS 63  BILITOT 1.7*  PROT 7.4  ALBUMIN 3.7   No results for input(s): LIPASE, AMYLASE in the last 168 hours. No results for input(s): AMMONIA in the last 168 hours. CBC:  Recent Labs Lab 12/12/15 1147 12/13/15 0508 12/14/15 1230  WBC 19.3* 14.8* 6.2  NEUTROABS 16.3*  --  4.4  HGB 13.0 12.8* 12.9*  HCT 38.0* 37.5* 38.1*  MCV 96.2 95.4 95.5  PLT 132* 155 183   Cardiac Enzymes: No results for input(s): CKTOTAL, CKMB, CKMBINDEX, TROPONINI in the last 168 hours. BNP: BNP (last 3 results) No results for input(s): BNP in the last 8760 hours.  ProBNP (last 3 results) No results for input(s): PROBNP in the last 8760 hours.  CBG: No results for input(s): GLUCAP in the last 168 hours.     SignedIrine Seal MD.  [facility] 12/16/2015, 1:14 PM

## 2015-12-16 NOTE — Progress Notes (Signed)
Agree with earlier assessment for first part of shift. No changes noted. Will continue to monitor.

## 2015-12-17 ENCOUNTER — Telehealth: Payer: Self-pay | Admitting: *Deleted

## 2015-12-17 LAB — CULTURE, BLOOD (ROUTINE X 2): Culture: NO GROWTH

## 2015-12-17 LAB — BLOOD CULTURE ID PANEL (REFLEXED)
Acinetobacter baumannii: NOT DETECTED
CANDIDA ALBICANS: NOT DETECTED
CANDIDA GLABRATA: NOT DETECTED
CANDIDA PARAPSILOSIS: NOT DETECTED
CANDIDA TROPICALIS: NOT DETECTED
Candida krusei: NOT DETECTED
Carbapenem resistance: NOT DETECTED
ENTEROBACTER CLOACAE COMPLEX: NOT DETECTED
ESCHERICHIA COLI: NOT DETECTED
Enterobacteriaceae species: DETECTED — AB
Enterococcus species: NOT DETECTED
HAEMOPHILUS INFLUENZAE: NOT DETECTED
KLEBSIELLA PNEUMONIAE: DETECTED — AB
Klebsiella oxytoca: NOT DETECTED
Listeria monocytogenes: NOT DETECTED
NEISSERIA MENINGITIDIS: NOT DETECTED
PROTEUS SPECIES: NOT DETECTED
Pseudomonas aeruginosa: NOT DETECTED
SERRATIA MARCESCENS: NOT DETECTED
STAPHYLOCOCCUS SPECIES: NOT DETECTED
STREPTOCOCCUS AGALACTIAE: NOT DETECTED
STREPTOCOCCUS PNEUMONIAE: NOT DETECTED
STREPTOCOCCUS PYOGENES: NOT DETECTED
Staphylococcus aureus (BCID): NOT DETECTED
Streptococcus species: NOT DETECTED

## 2015-12-17 NOTE — Telephone Encounter (Signed)
Acknowledged.  Discussed case with Dr Ardelia Mems.  Please see her separate note.  Patient being treated for Klebsiella UTI with Vantin.  He received IV abx in hospital and transitioned to PO abx and remained afebrile. Will plan to see him at his follow up as scheduled unless he changes clinically in the interim.

## 2015-12-17 NOTE — Telephone Encounter (Signed)
Received phone call from [place] in the [employer] lab with critical report for this pt. Pt had Klebsiella Pneumoniae present in his culture. Routing to PCP and will page her as well. Katharina Caper, Lucrezia Dehne D, [place]

## 2015-12-17 NOTE — Telephone Encounter (Signed)
I am precepting today and was asked to review this critical lab result.  Chart reviewed, patient was admitted to [facility] by [facility] and treated for known klebsiella UTI, initially with IV antibiotics and transitioned to PO vancin & discharged with total of 7 days. Blood culture has just resulted positive today with klebsiella as well.  Notes do not show any concern for new murmur or cardiovascular issues which should prompt eval for endocarditis. Chart with no mention of implantable hardware, indwelling lines, valve replacements, etc. Current course of vancin should be adequate tx for the bacteremia. Has hospital follow up appointment scheduled next week.  [facility] team, please call patient and make sure he's doing okay. Want to be sure he's not having any more fevers, is eating, drinking, urinating well. If any concerns have him come in for a visit today if at all possible, if any worsening this weekend should go to ED for evaluation.  Discussed with PCP  Leeanne Rio, MD

## 2015-12-17 NOTE — Telephone Encounter (Signed)
Spoke to Jeffery [name], patients care giver. Pt is doing good. No fevers, had just eaten lunch, taking meds, drinking water and urinating with no problems. Informed Jeffery [name] that if fevers start or if pt starts to worsen he should go to ED to be evaluated. Jeffery [name] understood and thanked [place] for checking on them. Ottis Stain, CMA

## 2015-12-19 LAB — CULTURE, BLOOD (ROUTINE X 2)

## 2015-12-23 ENCOUNTER — Ambulatory Visit (INDEPENDENT_AMBULATORY_CARE_PROVIDER_SITE_OTHER): Payer: Medicare Other | Admitting: Family Medicine

## 2015-12-23 VITALS — BP 108/68 | HR 74 | Temp 98.1°F | Ht 69.0 in | Wt 146.0 lb

## 2015-12-23 DIAGNOSIS — Z09 Encounter for follow-up examination after completed treatment for conditions other than malignant neoplasm: Secondary | ICD-10-CM | POA: Diagnosis present

## 2015-12-23 DIAGNOSIS — E876 Hypokalemia: Secondary | ICD-10-CM

## 2015-12-23 NOTE — Patient Instructions (Signed)
I am so glad to see that you are feeling better.  I will contact you will the results of your labs.  If anything is abnormal, I will call you.  Otherwise, expect a copy to be mailed to you.

## 2015-12-23 NOTE — Progress Notes (Signed)
    Subjective: CC: Hospital follow up HPI: Jeffery [name] is a 60 y.o. male presenting to clinic today for follow up.  He is accompanied to visit by his brother in law, Jeffery [name]. Concerns today include:  Hospital follow up for Klebsiella UTI/bacteremia Patient admitted to hospital on 12/12/15 for encephalopathy 2/2 UTI.  His UCx grew Klebsiella.  He was initially treated with IV abx and transitioned to oral Vantin.  He was asx at time of discharge.  Blood cultures later also grew Klebsiella.  Patient had no murmur or clinic symptoms of illness at time of discharge.  He was discharged with oral Vantin for 5 additional days.  Patient was called after discharge by our office to ensure that he was continuing to do well and he was.    He was also noted to be hypokalemic during hospitalization.  His discharge K was 3.9 (11/30).    Patient notes that since discharge from hospital he has been feeling ok.  Brother in law reports completion of Vantin as directed.  He denies LE cramping, fatigue, fevers, chills, nausea, vomiting, abdominal pain, dysuria, hematuria, urinary frequency.  Social History Reviewed: former smoker. FamHx and MedHx reviewed.  Please see EMR. Health Maintenance: TDap, Zostavax  ROS: Per HPI  Objective: Office vital signs reviewed. BP 108/68   Pulse 74   Temp 98.1 F (36.7 C) (Oral)   Ht 5\' 9"  (1.753 m)   Wt 146 lb (66.2 kg)   SpO2 97%   BMI 21.56 kg/m   Physical Examination:  General: Awake, alert, well nourished, well appearing, No acute distress Cardio: regular rate and rhythm, S1S2 heard Pulm: clear to auscultation bilaterally, no wheezes, rhonchi or rales, normal WOB on room air GI: soft, non-tender, non-distended, bowel sounds present x4, n masses, no suprapubic TTP MSK: no CVA TTP  Assessment/ Plan: 60 y.o. male   1. Hospital discharge follow-up.   Patient well appearing on exam. No findings to suggest persistent infection.    2. Hypokalemia.  Looks to  be resolved at discharge from hospital.  However, given nature of infection, will obtain repeat BMP. - BASIC METABOLIC PANEL WITH GFR - Will contact family with results, mail letter for their records  Follow up in April for annual exam or sooner if needed.   Janora Norlander, DO PGY-3, [facility]

## 2015-12-24 ENCOUNTER — Other Ambulatory Visit: Payer: Self-pay | Admitting: Family Medicine

## 2015-12-24 DIAGNOSIS — E875 Hyperkalemia: Secondary | ICD-10-CM

## 2015-12-24 LAB — BASIC METABOLIC PANEL WITH GFR
BUN: 13 mg/dL (ref 7–25)
CALCIUM: 9.3 mg/dL (ref 8.6–10.3)
CO2: 25 mmol/L (ref 20–31)
Chloride: 104 mmol/L (ref 98–110)
Creat: 0.94 mg/dL (ref 0.70–1.25)
GFR, Est Non African American: 87 mL/min (ref >=60)
GLUCOSE: 73 mg/dL (ref 65–99)
Potassium: 5.8 mmol/L — ABNORMAL HIGH (ref 3.5–5.3)
Sodium: 140 mmol/L (ref 135–146)

## 2016-01-27 ENCOUNTER — Encounter: Payer: Self-pay | Admitting: Family Medicine

## 2016-01-27 ENCOUNTER — Other Ambulatory Visit: Payer: Medicare Other

## 2016-01-27 ENCOUNTER — Telehealth: Payer: Self-pay | Admitting: *Deleted

## 2016-01-27 ENCOUNTER — Telehealth: Payer: Self-pay | Admitting: Family Medicine

## 2016-01-27 DIAGNOSIS — E875 Hyperkalemia: Secondary | ICD-10-CM

## 2016-01-27 NOTE — Telephone Encounter (Signed)
I called Karlyn Agee, pt's brother in law, per DPR. Pt's brother in law reports that since his dilantin was decreased in August, pt has been having seizures. (Pt was taking dilantin 200mg  and a 50 mg chewable tablet at bedtime, but then Hoyle Sauer, NP advised pt to stop the 50 mg and only take the 200mg .) Kalani thinks that the pt has had about 4 seizures in the past month after stopping the 50mg  of dilantin tablet in August. [place] reports that since pt started having seizures again, he has increased the dilantin from 2 capsules at bedtime to 3 capsules about one week ago. Since the increase in dilantin, pt has not had a seizure. Pt is still also taking depakote 3 capsules at bedtime.  Pt's brother in law also needs a letter from from [place], NP stating that the pt cannot care for himself.  I advised pt that I would let Hoyle Sauer, NP know what is going on.

## 2016-01-27 NOTE — Telephone Encounter (Addendum)
I called pt's brother-in-law and advised him that pt needs an appt to check his blood work. Pt was toxic on dilantin when he was taking 250mg  of dilantin qhs and now he is taking 300mg  dilantin qhs. Pt's brother in law is agreeable to an appt on 02/02/2016 at 9:15 am with an arrival at 9:00am. Pt's brother in law verbalized understanding of appt date and time.

## 2016-01-27 NOTE — Telephone Encounter (Signed)
Please set up appt he was toxic on Dilantin

## 2016-01-27 NOTE — Telephone Encounter (Signed)
Spoke to Mr Jeffery [name].  Letter completed and placed up front for pick up.

## 2016-01-27 NOTE — Telephone Encounter (Signed)
Karlyn Agee patient's care giver (also brother-in -law) request letter showing Jackston Gomes's disabilities. This letter needs to show patient is unable to care for him self. If any questions please call [phone].

## 2016-01-28 LAB — BASIC METABOLIC PANEL WITH GFR
BUN: 9 mg/dL (ref 7–25)
CO2: 27 mmol/L (ref 20–31)
Calcium: 9 mg/dL (ref 8.6–10.3)
Chloride: 105 mmol/L (ref 98–110)
Creat: 0.83 mg/dL (ref 0.70–1.25)
GFR, Est African American: >89 mL/min (ref >=60)
GFR, Est Non African American: >89 mL/min (ref >=60)
GLUCOSE: 126 mg/dL — AB (ref 65–99)
POTASSIUM: 4 mmol/L (ref 3.5–5.3)
Sodium: 144 mmol/L (ref 135–146)

## 2016-02-02 ENCOUNTER — Ambulatory Visit: Payer: Self-pay | Admitting: Nurse Practitioner

## 2016-02-03 ENCOUNTER — Ambulatory Visit: Payer: Self-pay | Admitting: Nurse Practitioner

## 2016-03-02 ENCOUNTER — Emergency Department (HOSPITAL_COMMUNITY)
Admission: EM | Admit: 2016-03-02 | Discharge: 2016-03-02 | Disposition: A | Discharge status: Home / Self Care | Payer: Medicare Other | Attending: Physician Assistant | Admitting: Physician Assistant

## 2016-03-02 ENCOUNTER — Emergency Department (HOSPITAL_COMMUNITY): Payer: Medicare Other

## 2016-03-02 ENCOUNTER — Encounter (HOSPITAL_COMMUNITY): Payer: Self-pay | Admitting: Emergency Medicine

## 2016-03-02 DIAGNOSIS — Z87891 Personal history of nicotine dependence: Secondary | ICD-10-CM | POA: Diagnosis not present

## 2016-03-02 DIAGNOSIS — R079 Chest pain, unspecified: Secondary | ICD-10-CM | POA: Diagnosis not present

## 2016-03-02 DIAGNOSIS — R4182 Altered mental status, unspecified: Secondary | ICD-10-CM | POA: Diagnosis not present

## 2016-03-02 DIAGNOSIS — Z8546 Personal history of malignant neoplasm of prostate: Secondary | ICD-10-CM | POA: Insufficient documentation

## 2016-03-02 DIAGNOSIS — I509 Heart failure, unspecified: Secondary | ICD-10-CM | POA: Insufficient documentation

## 2016-03-02 DIAGNOSIS — T420X1A Poisoning by hydantoin derivatives, accidental (unintentional), initial encounter: Secondary | ICD-10-CM | POA: Diagnosis not present

## 2016-03-02 LAB — COMPREHENSIVE METABOLIC PANEL
ALK PHOS: 72 U/L (ref 38–126)
ALT: 17 U/L (ref 17–63)
AST: 17 U/L (ref 15–41)
Albumin: 4.1 g/dL (ref 3.5–5.0)
Anion gap: 8 (ref 5–15)
BUN: 15 mg/dL (ref 6–20)
CALCIUM: 9.4 mg/dL (ref 8.9–10.3)
CO2: 27 mmol/L (ref 22–32)
CREATININE: 0.85 mg/dL (ref 0.61–1.24)
Chloride: 108 mmol/L (ref 101–111)
GFR calc non Af Amer: >60 mL/min (ref >60)
Glucose, Bld: 145 mg/dL — ABNORMAL HIGH (ref 65–99)
Potassium: 4 mmol/L (ref 3.5–5.1)
SODIUM: 143 mmol/L (ref 135–145)
TOTAL PROTEIN: 7.6 g/dL (ref 6.5–8.1)
Total Bilirubin: 0.6 mg/dL (ref 0.3–1.2)

## 2016-03-02 LAB — URINALYSIS, ROUTINE W REFLEX MICROSCOPIC
Bilirubin Urine: NEGATIVE
GLUCOSE, UA: NEGATIVE mg/dL
HGB URINE DIPSTICK: NEGATIVE
KETONES UR: NEGATIVE mg/dL
Leukocytes, UA: NEGATIVE
Nitrite: NEGATIVE
PH: 7 (ref 5.0–8.0)
Protein, ur: NEGATIVE mg/dL
Specific Gravity, Urine: 1.017 (ref 1.005–1.030)

## 2016-03-02 LAB — CBC WITH DIFFERENTIAL/PLATELET
Basophils Absolute: 0 10*3/uL (ref 0.0–0.1)
Basophils Relative: 0 %
EOS ABS: 0.1 10*3/uL (ref 0.0–0.7)
EOS PCT: 2 %
HCT: 42.1 % (ref 39.0–52.0)
Hemoglobin: 14.1 g/dL (ref 13.0–17.0)
LYMPHS ABS: 1.9 10*3/uL (ref 0.7–4.0)
Lymphocytes Relative: 39 %
MCH: 31.3 pg (ref 26.0–34.0)
MCHC: 33.5 g/dL (ref 30.0–36.0)
MCV: 93.6 fL (ref 78.0–100.0)
MONOS PCT: 9 %
Monocytes Absolute: 0.4 10*3/uL (ref 0.1–1.0)
Neutro Abs: 2.4 10*3/uL (ref 1.7–7.7)
Neutrophils Relative %: 50 %
PLATELETS: 183 10*3/uL (ref 150–400)
RBC: 4.5 MIL/uL (ref 4.22–5.81)
RDW: 13.3 % (ref 11.5–15.5)
WBC: 4.8 10*3/uL (ref 4.0–10.5)

## 2016-03-02 LAB — I-STAT TROPONIN, ED: Troponin i, poc: 0 ng/mL (ref 0.00–0.08)

## 2016-03-02 LAB — PHENYTOIN LEVEL, TOTAL: Phenytoin Lvl: 26.4 ug/mL — ABNORMAL HIGH (ref 10.0–20.0)

## 2016-03-02 LAB — PROTIME-INR
INR: 1.04
PROTHROMBIN TIME: 13.6 s (ref 11.4–15.2)

## 2016-03-02 LAB — I-STAT CG4 LACTIC ACID, ED: Lactic Acid, Venous: 1.87 mmol/L (ref 0.5–1.9)

## 2016-03-02 LAB — VALPROIC ACID LEVEL: Valproic Acid Lvl: 49 ug/mL — ABNORMAL LOW (ref 50.0–100.0)

## 2016-03-02 NOTE — ED Notes (Signed)
Pt has urinal at bedside and aware urine sample is needed.

## 2016-03-02 NOTE — ED Provider Notes (Signed)
[facility] DEPT Provider Note   CSN: [id number] Arrival date & time: 03/02/16  1621     History   Chief Complaint Chief Complaint  Patient presents with  . Altered Mental Status    HPI Jeffery [name] is a 60 y.o. male.  HPI   Pt is a 61 yo with MR and prostate cancern here with AMS and CP.  AMS for the last several days- unsteadiness.  Also compalins of pain with urination.  Patient had CP starting today.  DId not ocur with dexertion- described as heavy and left sided.   Hisotry provided by family member.   Level 5 caveat- baseline mental status.   Past Medical History:  Diagnosis Date  . Cancer ([place])    PROSTATE CANCER--PROSTATE BIOPSY PLANNED EVERY 6 MONTHS--NO TX PLANNED UNLESS THE CANCER STARTS PROGRESSING-  . Hypercholesteremia    under control  . 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 developed retardation symptoms about age 65,? whether drug related. Start having seizures. Hx. ETOH abuse in past. -"cognitive issues-can not live alone"  . Seizures ([place])    last seizure 10/27/13  [place]    Patient Active Problem List   Diagnosis Date Noted  . Acute cystitis without hematuria   . Altered mental status 12/12/2015  . Metabolic encephalopathy 123456  . Congestive heart failure with left ventricular diastolic dysfunction ([place]) 05/28/2015  . Prostate cancer ([place]) 04/07/2015  . Profound intellectual disabilities 11/15/2012  . Encounter for therapeutic drug monitoring 11/15/2012  . Routine general medical examination at a health care facility 10/02/2012  . Muscle spasm of back 09/19/2011  . Hearing difficulty 08/17/2011  . Hypercholesterolemia 08/17/2011  . MENTAL RETARDATION, MILD 01/30/2008  . Convulsions ([place]) 01/30/2008  . ELEVATED PROSTATE SPECIFIC ANTIGEN 01/30/2008    Past Surgical History:  Procedure Laterality Date  . PROSTATE BIOPSY  03/27/2011   Procedure: BIOPSY TRANSRECTAL ULTRASONIC PROSTATE (TUBP);  Surgeon: Molli Hazard, MD;  Location: [facility] ORS;  Service: Urology;  Laterality: N/A;  Prostatic Block       . PROSTATE BIOPSY  09/27/2011   Procedure: BIOPSY TRANSRECTAL ULTRASONIC PROSTATE (TUBP);  Surgeon: Molli Hazard, MD;  Location: [facility] ORS;  Service: Urology;  Laterality: N/A;       . PROSTATE BIOPSY N/A 10/29/2013   Procedure: BIOPSY TRANSRECTAL ULTRASONIC PROSTATE (TUBP);  Surgeon: Alexis Frock, MD;  Location: [facility];  Service: Urology;  Laterality: N/A;       Home Medications    Prior to Admission medications   Medication Sig Start Date End Date Taking? Authorizing Provider  AVODART 0.5 MG capsule Take 0.5 mg by mouth every other day.    Yes Historical Provider, MD  divalproex (DEPAKOTE ER) 500 MG 24 hr tablet Take 1,500 mg by mouth at bedtime.   Yes Historical Provider, MD  Multiple Vitamin (MULTIVITAMIN WITH MINERALS) TABS tablet Take 1 tablet by mouth at bedtime.   Yes Historical Provider, MD  phenytoin (DILANTIN) 100 MG ER capsule Take 200 mg by mouth at bedtime.   Yes Historical Provider, MD  pravastatin (PRAVACHOL) 40 MG tablet Take 1 tablet (40 mg total) by mouth at bedtime. 04/07/15  Yes Mariel Aloe, MD    Family History Family History  Problem Relation Age of Onset  . Hypertension Sister   . Diabetes Sister   . Hypertension Sister     Social History Social History  Substance Use Topics  . Smoking status: Former Smoker    Packs/day: 0.50  Years: 10.00    Types: Cigarettes  . Smokeless tobacco: Never Used     Comment: SNEAKS A SMOKE EVERY NOW AND THEN  . Alcohol use No     Comment: none in 15 yrs,past ETOH abuse Sneaks liquor once every few months     Allergies   Patient has no known allergies.   Review of Systems Review of Systems  Constitutional: Positive for activity change and fever.  Respiratory: Negative for shortness of breath.   Cardiovascular: Positive for chest pain.  Gastrointestinal: Negative for abdominal pain.  Genitourinary:  Positive for dysuria.  Neurological: Positive for weakness and light-headedness.  All other systems reviewed and are negative.    Physical Exam Updated Vital Signs BP 147/94   Pulse 106   Temp 99 F (37.2 C) (Oral)   Resp 18   Wt 135 lb (61.2 kg)   SpO2 98%   BMI 19.94 kg/m   Physical Exam  Constitutional: He appears well-nourished.  HENT:  Head: Normocephalic.  Eyes: Conjunctivae are normal.  Cardiovascular: Normal rate and regular rhythm.   No murmur heard. Pulmonary/Chest: Effort normal and breath sounds normal. No respiratory distress. He has no wheezes.  Abdominal: Soft. He exhibits no distension. There is no tenderness.  Neurological: He is alert. No cranial nerve deficit.  Skin: Skin is warm and dry. He is not diaphoretic.  Psychiatric: He has a normal mood and affect. His behavior is normal.     ED Treatments / Results  Labs (all labs ordered are listed, but only abnormal results are displayed) Labs Reviewed  COMPREHENSIVE METABOLIC PANEL - Abnormal; Notable for the following:       Result Value   Glucose, Bld 145 (*)    All other components within normal limits  URINALYSIS, ROUTINE W REFLEX MICROSCOPIC - Abnormal; Notable for the following:    APPearance HAZY (*)    All other components within normal limits  VALPROIC ACID LEVEL - Abnormal; Notable for the following:    Valproic Acid Lvl 49 (*)    All other components within normal limits  PHENYTOIN LEVEL, TOTAL - Abnormal; Notable for the following:    Phenytoin Lvl 26.4 (*)    All other components within normal limits  CULTURE, BLOOD (ROUTINE X 2)  CULTURE, BLOOD (ROUTINE X 2)  CBC WITH DIFFERENTIAL/PLATELET  PROTIME-INR  I-STAT CG4 LACTIC ACID, ED  I-STAT TROPOININ, ED    EKG  EKG Interpretation  Date/Time:  Thursday March 02 2016 16:34:52 EST Ventricular Rate:  81 PR Interval:    QRS Duration: 79 QT Interval:  335 QTC Calculation: 389 R Axis:   41 Text Interpretation:  Sinus rhythm  Probable left atrial enlargement No significant change since last tracing Confirmed by Gerald Leitz ([id number]) on 03/02/2016 5:46:54 PM       Radiology Dg Chest 2 View  Result Date: 03/02/2016 CLINICAL DATA:  Chest pain. EXAM: CHEST  2 VIEW COMPARISON:  Radiograph of December 12, 2015. FINDINGS: The heart size and mediastinal contours are within normal limits. Both lungs are clear. No pneumothorax or pleural effusion is noted. The visualized skeletal structures are unremarkable. IMPRESSION: No active cardiopulmonary disease. Electronically Signed   By: Marijo Conception, M.D.   On: 03/02/2016 17:17   Ct Head Wo Contrast  Result Date: 03/02/2016 CLINICAL DATA:  Altered mental status. EXAM: CT HEAD WITHOUT CONTRAST TECHNIQUE: Contiguous axial images were obtained from the base of the skull through the vertex without intravenous contrast. COMPARISON:  None. FINDINGS: Brain:  Mild diffuse cortical atrophy is noted. No mass effect or midline shift is noted. Ventricular size is within normal limits. There is no evidence of mass lesion, hemorrhage or acute infarction. Vascular: No hyperdense vessel or unexpected calcification. Skull: Normal. Negative for fracture or focal lesion. Sinuses/Orbits: No acute finding. Other: None. IMPRESSION: Mild diffuse cortical atrophy. No acute intracranial abnormality seen. Electronically Signed   By: Marijo Conception, M.D.   On: 03/02/2016 20:54    Procedures Procedures (including critical care time)  Medications Ordered in ED Medications - No data to display   Initial Impression / Assessment and Plan / ED Course  I have reviewed the triage vital signs and the nursing notes.  Pertinent labs & imaging results that were available during my care of the patient were reviewed by me and considered in my medical decision making (see chart for details).     Pt is a 61 yo MR with ho prostate ca here with AMS and CP. Pt has had AMS similar to this in thepast with UTI, pt  has had fevers at home.  Will work up for UTI and sepsis.  Will begin torponins given patietn's CP.   Pt PE reasuuring as are his VS.   Urine was normal as were labs. Vital signs continued to be normal. Discussed with family member who thought it could be his Dilantin which is occasionally high.   We tested Dilantin today and was 26. Patient does have mild unsteadiness. Discussed with neurology and there is no need for emergent admission, patient can be taken care of at home. Patient is eating and drinking normally. We will have him hold off on his Dilantin, call his neurologist in the morning to get further advice about restarting at a lower dose.  Patient is comfortable, ambulatory, and taking PO at time of discharge.  Patient expressed understanding about return precautions.    Final Clinical Impressions(s) / ED Diagnoses   Final diagnoses:  Accidental phenytoin overdose, initial encounter    New Prescriptions Discharge Medication List as of 03/02/2016 10:46 PM       Odena Mcquaid Lyn Allanna Bresee, MD 03/03/16 0111

## 2016-03-02 NOTE — Discharge Instructions (Signed)
We think that you're slight unsteady gait is due to too much phenytoin. We will need to you to stop taking your phenytoin and call your neurologist in the morning. They will tell you likely hold on this medication for several days and then restart a lower dose. Please follow their instructions. Please return if you have any increase in symptoms.

## 2016-03-02 NOTE — ED Triage Notes (Signed)
Pt presents with brother in law who looks after him, hx of MR.Pt has hx of UTI and family reports pt has been stumbling since Sunday and c/o of chest pain. Pt is continent but family reports a smell to it. Family reports AMS and has been admitted 2 times before for UTI.

## 2016-03-03 ENCOUNTER — Telehealth: Payer: Self-pay | Admitting: Neurology

## 2016-03-03 NOTE — Telephone Encounter (Signed)
Patient's caretaker called after hours: Jeffery [name] (brother in law) and reported he took patient to ER yesterday, due to unsteadiness and Dilantin level was high. Skipped last night's dose. I advised to skip tonight's dose too and call on Mon for appt sooner than in 8/18 with CM. May need to move up appt with Dr. Keturah Barre or CM.  VPA level was on the lower end and he was advised to cont current depakote dose.  Caretaker was in agreement.

## 2016-03-06 NOTE — Telephone Encounter (Signed)
Mr Gallant's sister is main caretaker and is a Marine scientist. He will need a RV with NP or me.

## 2016-03-06 NOTE — Telephone Encounter (Signed)
I called cell phone and left a message to get patient scheduled for a sooner appt. I  Called home number, no answer and no vm.

## 2016-03-07 LAB — CULTURE, BLOOD (ROUTINE X 2)
CULTURE: NO GROWTH
CULTURE: NO GROWTH

## 2016-03-08 ENCOUNTER — Ambulatory Visit (INDEPENDENT_AMBULATORY_CARE_PROVIDER_SITE_OTHER): Payer: Medicare Other | Admitting: Neurology

## 2016-03-08 ENCOUNTER — Encounter: Payer: Self-pay | Admitting: Neurology

## 2016-03-08 VITALS — BP 104/62 | HR 92 | Resp 16 | Ht 69.0 in | Wt 141.0 lb

## 2016-03-08 DIAGNOSIS — G40101 Localization-related (focal) (partial) symptomatic epilepsy and epileptic syndromes with simple partial seizures, not intractable, with status epilepticus: Secondary | ICD-10-CM | POA: Diagnosis not present

## 2016-03-08 DIAGNOSIS — G809 Cerebral palsy, unspecified: Secondary | ICD-10-CM

## 2016-03-08 MED ORDER — DIVALPROEX SODIUM ER 500 MG PO TB24: 1500.0000 mg | ORAL_TABLET | Freq: Every day | ORAL | 4 refills | Status: DC

## 2016-03-08 MED ORDER — PHENYTOIN 50 MG PO CHEW
50.0000 mg | CHEWABLE_TABLET | Freq: Every day | ORAL | 3 refills | Status: DC
Start: 2016-03-08 — End: 2018-06-27

## 2016-03-08 MED ORDER — PHENYTOIN SODIUM EXTENDED 100 MG PO CAPS: 200.0000 mg | ORAL_CAPSULE | Freq: Every day | ORAL | 3 refills | Status: DC
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
## 2016-03-08 NOTE — Patient Instructions (Signed)
We may change you to this medication , if we cannot control your phenytoin levels.   Lacosamide tablets What is this medicine? LACOSAMIDE (la KOE sa mide) is used to control seizures caused by certain types of epilepsy. COMMON BRAND NAME(S): Vimpat What should I tell my health care provider before I take this medicine? They need to know if you have any of these conditions: -dehydration -heart disease, including heart failure -history of a drug or alcohol abuse problem -kidney disease -liver disease -suicidal thoughts, plans, or attempt; a previous suicide attempt by you or a family member -an unusual or allergic reaction to lacosamide, other medicines, foods, dyes, or preservatives -pregnant or trying to get pregnant -breast-feeding How should I use this medicine? Take this medicine by mouth with a glass of water. You can take it with or without food. Follow the directions on the prescription label. Take your doses at regular intervals. Do not take your medicine more often than directed. Do not stop taking except on the advice of your doctor or health care professional. A special MedGuide will be given to you by the pharmacist with each prescription and refill. Be sure to read this information carefully each time. Talk to your pediatrician regarding the use of this medicine in children. Special care may be needed. What if I miss a dose? If you miss a dose, take it as soon as you can. If it is almost time for your next dose, take only that dose. Do not take double or extra doses. What may interact with this medicine? -atazanavir -beta-blockers like metoprolol and propranolol -calcium channel blockers like diltiazem and verapamil -digoxin -dronedarone -lopinavir/ritonavir What should I watch for while using this medicine? Visit your doctor or health care professional for regular checks on your progress. This medicine needs careful monitoring. Wear a medical ID bracelet or chain, and  carry a card that describes your disease and details of your medicine and dosage times. You may get drowsy or dizzy. Do not drive, use machinery, or do anything that needs mental alertness until you know how this medicine affects you. Do not stand or sit up quickly, especially if you are an older patient. This reduces the risk of dizzy or fainting spells. Alcohol may interfere with the effect of this medicine. Avoid alcoholic drinks. The use of this medicine may increase the chance of suicidal thoughts or actions. Pay special attention to how you are responding while on this medicine. Any worsening of mood, or thoughts of suicide or dying should be reported to your health care professional right away. Women who become pregnant while using this medicine may enroll in the [place] Pregnancy Registry by calling [phone]. This registry collects information about the safety of antiepileptic drug use during pregnancy. What side effects may I notice from receiving this medicine? Side effects that you should report to your doctor or health care professional as soon as possible: -allergic reactions like skin rash, itching or hives, swelling of the face, lips, or tongue -confusion -feeling faint or lightheaded, falls -fever -irregular heart beat -loss of memory -suicidal thoughts or other mood changes -unusually weak or tired -yellowing of the eyes, skin Side effects that usually do not require medical attention (report to your doctor or health care professional if they continue or are bothersome): -constipation -diarrhea -drowsiness -dry mouth -headache -nausea Where should I keep my medicine? Keep out of the reach of children. This medicine can be abused. Keep your medicine in a safe  place to protect it from theft. Do not share this medicine with anyone. Selling or giving away this medicine is dangerous and against the law. This medicine may cause accidental overdose  and death if it taken by other adults, children, or pets. Mix any unused medicine with a substance like cat litter or coffee grounds. Then throw the medicine away in a sealed container like a sealed bag or a coffee can with a lid. Do not use the medicine after the expiration date. Store at room temperature between 15 and 30 degrees C (59 and 86 degrees F).  2017 Elsevier/Gold Standard (2013-02-28 15:32:37)

## 2016-03-10 ENCOUNTER — Telehealth: Payer: Self-pay

## 2016-03-10 NOTE — Telephone Encounter (Signed)
-----   Message from Larey Seat, MD sent at 03/09/2016  5:12 PM EST ----- Normal metabolic panel

## 2016-03-10 NOTE — Telephone Encounter (Signed)
I called pt's brother in law, Damaine Eyring to discuss his lab work. No answer, left a VM asking him to call me back.

## 2016-03-10 NOTE — Telephone Encounter (Signed)
Patients brother in law Gianlucca Lehrman called office and was notified of normal metabolic panel per [employer].

## 2016-03-16 LAB — COMPREHENSIVE METABOLIC PANEL
A/G RATIO: 1.6 (ref 1.2–2.2)
ALT: 17 IU/L (ref 0–44)
AST: 18 IU/L (ref 0–40)
Albumin: 4.4 g/dL (ref 3.6–4.8)
Alkaline Phosphatase: 93 IU/L (ref 39–117)
BUN/Creatinine Ratio: 19 (ref 10–24)
BUN: 16 mg/dL (ref 8–27)
Bilirubin Total: 0.3 mg/dL (ref 0.0–1.2)
CALCIUM: 9.8 mg/dL (ref 8.6–10.2)
CO2: 22 mmol/L (ref 18–29)
Chloride: 103 mmol/L (ref 96–106)
Creatinine, Ser: 0.86 mg/dL (ref 0.76–1.27)
GFR, EST AFRICAN AMERICAN: 107 mL/min/{1.73_m2} (ref >59)
GFR, EST NON AFRICAN AMERICAN: 92 mL/min/{1.73_m2} (ref >59)
GLOBULIN, TOTAL: 2.8 g/dL (ref 1.5–4.5)
Glucose: 91 mg/dL (ref 65–99)
POTASSIUM: 5.1 mmol/L (ref 3.5–5.2)
Sodium: 144 mmol/L (ref 134–144)
TOTAL PROTEIN: 7.2 g/dL (ref 6.0–8.5)

## 2016-03-16 LAB — PHENYTOIN LEVEL, TOTAL

## 2016-04-18 ENCOUNTER — Ambulatory Visit (INDEPENDENT_AMBULATORY_CARE_PROVIDER_SITE_OTHER): Payer: Medicare (Managed Care) | Admitting: Nurse Practitioner

## 2016-04-18 ENCOUNTER — Encounter: Payer: Self-pay | Admitting: Nurse Practitioner

## 2016-04-18 VITALS — BP 114/72 | HR 72 | Wt 146.4 lb

## 2016-04-18 DIAGNOSIS — R4182 Altered mental status, unspecified: Secondary | ICD-10-CM

## 2016-04-18 DIAGNOSIS — R569 Unspecified convulsions: Secondary | ICD-10-CM

## 2016-04-18 DIAGNOSIS — F70 Mild intellectual disabilities: Secondary | ICD-10-CM

## 2016-04-18 NOTE — Patient Instructions (Signed)
Plan continue Depakote ER 500 mg 3 tabs daily Continue Dilantin 250 mg daily Call for any seizure activity F/U in 6 months
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
## 2016-04-19 NOTE — Progress Notes (Signed)
I have read the note, and I agree with the clinical assessment and plan.  Alliah Boulanger A. Joss Mcdill, MD, PhD Certified in Neurology, Clinical Neurophysiology, Sleep Medicine, Pain Medicine and Neuroimaging  [facility] [address], [address] [place], [place] [address] [phone]  

## 2016-05-12 ENCOUNTER — Other Ambulatory Visit: Payer: Self-pay | Admitting: *Deleted

## 2016-05-12 DIAGNOSIS — E7800 Pure hypercholesterolemia, unspecified: Secondary | ICD-10-CM

## 2016-05-12 MED ORDER — PRAVASTATIN SODIUM 40 MG PO TABS: 40.0000 mg | ORAL_TABLET | Freq: Every day | ORAL | 3 refills | Status: DC

## 2016-08-07 DIAGNOSIS — C61 Malignant neoplasm of prostate: Secondary | ICD-10-CM | POA: Diagnosis not present

## 2016-08-07 DIAGNOSIS — N3020 Other chronic cystitis without hematuria: Secondary | ICD-10-CM | POA: Diagnosis not present

## 2016-08-24 ENCOUNTER — Ambulatory Visit: Payer: Medicare Other | Admitting: Nurse Practitioner
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
## 2016-10-18 ENCOUNTER — Encounter: Payer: Self-pay | Admitting: Nurse Practitioner

## 2016-10-18 ENCOUNTER — Ambulatory Visit (INDEPENDENT_AMBULATORY_CARE_PROVIDER_SITE_OTHER): Payer: Medicare (Managed Care) | Admitting: Nurse Practitioner

## 2016-10-18 VITALS — BP 102/66 | HR 65 | Ht 69.0 in | Wt 139.8 lb

## 2016-10-18 DIAGNOSIS — R4182 Altered mental status, unspecified: Secondary | ICD-10-CM | POA: Diagnosis not present

## 2016-10-18 DIAGNOSIS — Z5181 Encounter for therapeutic drug level monitoring: Secondary | ICD-10-CM

## 2016-10-18 DIAGNOSIS — R5601 Complex febrile convulsions: Secondary | ICD-10-CM

## 2016-10-18 NOTE — Patient Instructions (Signed)
continue Depakote ER 500 mg 3 tabs daily Continue Dilantin 250 mg daily Will check Dilantin level and valproic acid level for  Therapeutic/toxicity CBC and CMP Call for any seizure activity F/U in 8 months

## 2016-10-19 ENCOUNTER — Telehealth: Payer: Self-pay | Admitting: *Deleted

## 2016-10-19 LAB — CBC WITH DIFFERENTIAL/PLATELET
BASOS ABS: 0 10*3/uL (ref 0.0–0.2)
Basos: 1 %
EOS (ABSOLUTE): 0.2 10*3/uL (ref 0.0–0.4)
Eos: 5 %
Hematocrit: 40.3 % (ref 37.5–51.0)
Hemoglobin: 14 g/dL (ref 13.0–17.7)
Immature Grans (Abs): 0 10*3/uL (ref 0.0–0.1)
Immature Granulocytes: 0 %
LYMPHS ABS: 2 10*3/uL (ref 0.7–3.1)
Lymphs: 50 %
MCH: 32.9 pg (ref 26.6–33.0)
MCHC: 34.7 g/dL (ref 31.5–35.7)
MCV: 95 fL (ref 79–97)
MONOS ABS: 0.3 10*3/uL (ref 0.1–0.9)
Monocytes: 8 %
NEUTROS ABS: 1.4 10*3/uL (ref 1.4–7.0)
Neutrophils: 36 %
Platelets: 295 10*3/uL (ref 150–379)
RBC: 4.26 x10E6/uL (ref 4.14–5.80)
RDW: 13.5 % (ref 12.3–15.4)
WBC: 3.8 10*3/uL (ref 3.4–10.8)

## 2016-10-19 LAB — COMPREHENSIVE METABOLIC PANEL
A/G RATIO: 1.4 (ref 1.2–2.2)
ALBUMIN: 4.1 g/dL (ref 3.6–4.8)
ALK PHOS: 87 IU/L (ref 39–117)
ALT: 21 IU/L (ref 0–44)
AST: 25 IU/L (ref 0–40)
BUN / CREAT RATIO: 11 (ref 10–24)
BUN: 10 mg/dL (ref 8–27)
CO2: 23 mmol/L (ref 20–29)
Calcium: 9.5 mg/dL (ref 8.6–10.2)
Chloride: 107 mmol/L — ABNORMAL HIGH (ref 96–106)
Creatinine, Ser: 0.87 mg/dL (ref 0.76–1.27)
GFR calc non Af Amer: 92 mL/min/{1.73_m2} (ref >59)
GFR, EST AFRICAN AMERICAN: 106 mL/min/{1.73_m2} (ref >59)
GLOBULIN, TOTAL: 2.9 g/dL (ref 1.5–4.5)
GLUCOSE: 69 mg/dL (ref 65–99)
POTASSIUM: 4.8 mmol/L (ref 3.5–5.2)
SODIUM: 145 mmol/L — AB (ref 134–144)
Total Protein: 7 g/dL (ref 6.0–8.5)

## 2016-10-19 LAB — VALPROIC ACID LEVEL: VALPROIC ACID LVL: 46 ug/mL — AB (ref 50–100)

## 2016-10-19 LAB — PHENYTOIN LEVEL, TOTAL: PHENYTOIN (DILANTIN), SERUM: 5.8 ug/mL — AB (ref 10.0–20.0)

## 2016-10-19 NOTE — Telephone Encounter (Signed)
-----   Message from Dennie Bible, NP sent at 10/19/2016  7:59 AM EDT ----- Labs ok except Dilantin level low, please make sure he is taking his meds as directed. Please call the c/g. Level yesterday 5.8 level 1 year ago 22.6.

## 2016-10-19 NOTE — Progress Notes (Signed)
I agree with the assessment and plan as directed by NP .The patient is known to me .   Almedia Cordell, MD  

## 2016-10-19 NOTE — Telephone Encounter (Signed)
Spoke to caregiver and relayed the results of his lab work.  Dilantin level low.  Wanted to make sure he was taking his dilantin as ordered 250mg  dilantin daily.  He stated he was.  No seizures.  He verbalized understanding.

## 2017-05-01 ENCOUNTER — Telehealth: Payer: Self-pay | Admitting: Nurse Practitioner

## 2017-05-01 NOTE — Telephone Encounter (Addendum)
Brother in Sports coach and sister need note stating pt is disabled and his sister and brother in law are his caregivers.  IRS is asking for a note relating to yr 2016.  I asked if he has POA/legal guardianship on pt and he stated they have no paperwork relating to this, and we have no DPR on pt.  We have been seeing this pt prior to our Epic record 2014.  Will you do this?

## 2017-05-01 NOTE — Telephone Encounter (Signed)
Pts brother-in-law Shamarion Coots requesting a call to discuss the "pts background history and sleep history" didn't want to go into detail with me.

## 2017-05-02 ENCOUNTER — Encounter: Payer: Self-pay | Admitting: *Deleted

## 2017-05-02 NOTE — Telephone Encounter (Signed)
Will come by and sign DPR in th am.

## 2017-05-02 NOTE — Telephone Encounter (Signed)
They need to be on [facility]

## 2017-05-03 ENCOUNTER — Encounter: Payer: Self-pay | Admitting: *Deleted

## 2017-05-03 NOTE — Telephone Encounter (Signed)
DPR printed and in file at check in for signature.  (letter to CM/NP to sign).

## 2017-05-03 NOTE — Telephone Encounter (Signed)
Signed letter to check in file for pick up.

## 2017-05-04 NOTE — Telephone Encounter (Addendum)
Spoke to brother in Sports [employer] and letter is ready.  I read to him so he is aware of what it contains.  He will pick today. Made aware close today at 1200.
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
## 2017-06-19 ENCOUNTER — Ambulatory Visit (INDEPENDENT_AMBULATORY_CARE_PROVIDER_SITE_OTHER): Payer: Medicare (Managed Care) | Admitting: Nurse Practitioner

## 2017-06-19 ENCOUNTER — Encounter: Payer: Self-pay | Admitting: Nurse Practitioner

## 2017-06-19 VITALS — BP 110/70 | HR 67 | Ht 69.0 in | Wt 150.6 lb

## 2017-06-19 DIAGNOSIS — G40909 Epilepsy, unspecified, not intractable, without status epilepticus: Secondary | ICD-10-CM | POA: Diagnosis not present

## 2017-06-19 DIAGNOSIS — F73 Profound intellectual disabilities: Secondary | ICD-10-CM

## 2017-06-19 DIAGNOSIS — Z5181 Encounter for therapeutic drug level monitoring: Secondary | ICD-10-CM | POA: Diagnosis not present

## 2017-06-19 NOTE — Patient Instructions (Signed)
continue Depakote ER 500 mg 3 tabs daily Continue Dilantin 250 mg daily Will check Dilantin level and valproic acid level for  Therapeutic/toxicity CBC and CMP to monitor for adverse effects of Dilantin and valproic acid Call for any seizure activity F/U in 1 year

## 2017-06-20 LAB — CBC WITH DIFFERENTIAL/PLATELET
BASOS ABS: 0 10*3/uL (ref 0.0–0.2)
Basos: 0 %
EOS (ABSOLUTE): 0.1 10*3/uL (ref 0.0–0.4)
Eos: 2 %
Hematocrit: 39.3 % (ref 37.5–51.0)
Hemoglobin: 12.8 g/dL — ABNORMAL LOW (ref 13.0–17.7)
IMMATURE GRANS (ABS): 0 10*3/uL (ref 0.0–0.1)
IMMATURE GRANULOCYTES: 0 %
LYMPHS: 36 %
Lymphocytes Absolute: 1.9 10*3/uL (ref 0.7–3.1)
MCH: 33.3 pg — AB (ref 26.6–33.0)
MCHC: 32.6 g/dL (ref 31.5–35.7)
MCV: 102 fL — ABNORMAL HIGH (ref 79–97)
Monocytes Absolute: 0.4 10*3/uL (ref 0.1–0.9)
Monocytes: 7 %
NEUTROS PCT: 55 %
Neutrophils Absolute: 2.9 10*3/uL (ref 1.4–7.0)
PLATELETS: 229 10*3/uL (ref 150–450)
RBC: 3.84 x10E6/uL — ABNORMAL LOW (ref 4.14–5.80)
RDW: 14 % (ref 12.3–15.4)
WBC: 5.3 10*3/uL (ref 3.4–10.8)

## 2017-06-20 LAB — COMPREHENSIVE METABOLIC PANEL
A/G RATIO: 1.7 (ref 1.2–2.2)
ALT: 20 IU/L (ref 0–44)
AST: 20 IU/L (ref 0–40)
Albumin: 4.4 g/dL (ref 3.6–4.8)
Alkaline Phosphatase: 89 IU/L (ref 39–117)
BUN/Creatinine Ratio: 14 (ref 10–24)
BUN: 13 mg/dL (ref 8–27)
Bilirubin Total: 0.4 mg/dL (ref 0.0–1.2)
CO2: 23 mmol/L (ref 20–29)
Calcium: 9.7 mg/dL (ref 8.6–10.2)
Chloride: 105 mmol/L (ref 96–106)
Creatinine, Ser: 0.91 mg/dL (ref 0.76–1.27)
GFR calc Af Amer: 103 mL/min/{1.73_m2} (ref >59)
GFR calc non Af Amer: 89 mL/min/{1.73_m2} (ref >59)
Globulin, Total: 2.6 g/dL (ref 1.5–4.5)
Glucose: 82 mg/dL (ref 65–99)
POTASSIUM: 5.1 mmol/L (ref 3.5–5.2)
Sodium: 145 mmol/L — ABNORMAL HIGH (ref 134–144)
Total Protein: 7 g/dL (ref 6.0–8.5)

## 2017-06-20 LAB — VALPROIC ACID LEVEL: Valproic Acid Lvl: 49 ug/mL — ABNORMAL LOW (ref 50–100)

## 2017-06-20 LAB — PHENYTOIN LEVEL, TOTAL: PHENYTOIN (DILANTIN), SERUM: 20 ug/mL (ref 10.0–20.0)

## 2017-06-21 ENCOUNTER — Telehealth: Payer: Self-pay | Admitting: *Deleted

## 2017-06-21 NOTE — Progress Notes (Signed)
I agree with the assessment and plan as directed by NP .The patient is known to me .   Shalayna Ornstein, MD  

## 2017-06-21 NOTE — Telephone Encounter (Signed)
LVM for caregiver, Mr Landry Mellow requesting call back re: patient's lab results.

## 2017-06-25 ENCOUNTER — Telehealth: Payer: Self-pay | Admitting: *Deleted

## 2017-06-25 NOTE — Telephone Encounter (Signed)
Relayed to Karlyn Agee, caregiver that his labs were stable per CM/NP.  He verbalized understanding.

## 2017-06-25 NOTE — Telephone Encounter (Signed)
-----   Message from Dennie Bible, NP sent at 06/20/2017 11:32 AM EDT ----- Labs are stable please call his caregiver

## 2017-09-19 IMAGING — CT CT HEAD W/O CM
3 of 4 series · 15 of 47 positions shown, 18 images · non-contrast
Comparison: None.

CLINICAL DATA: Altered mental status.

EXAM:
CT HEAD WITHOUT CONTRAST
TECHNIQUE: Contiguous axial images were obtained from the base of the skull
through the vertex without intravenous contrast.

[Series 2: head w/o · axial · non-contrast · 0.45mm/px · z∈[-85,+35]mm · 9 of 31 slices shown, 12 images]
[im 4/31  brain]
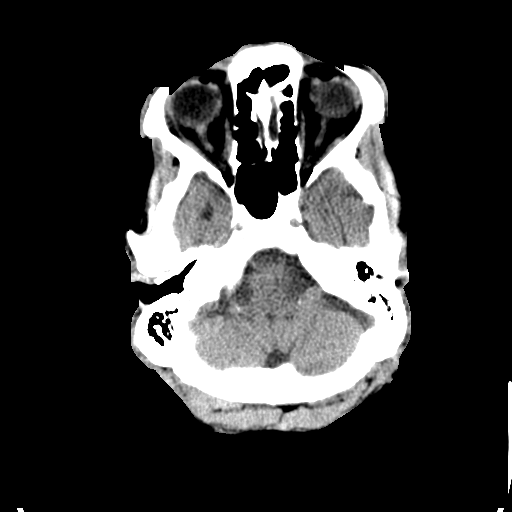
[im 4/31  bone]
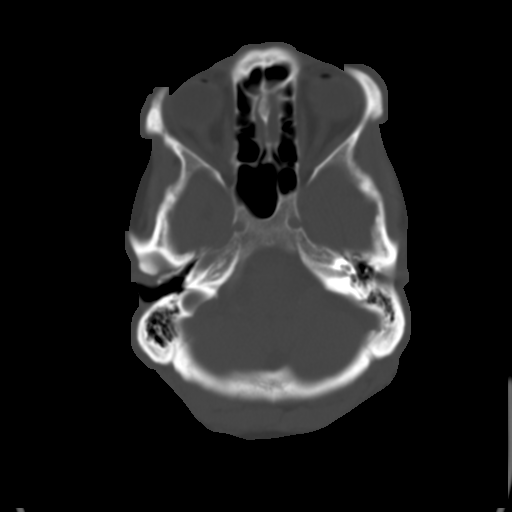
[im 7/31  brain]
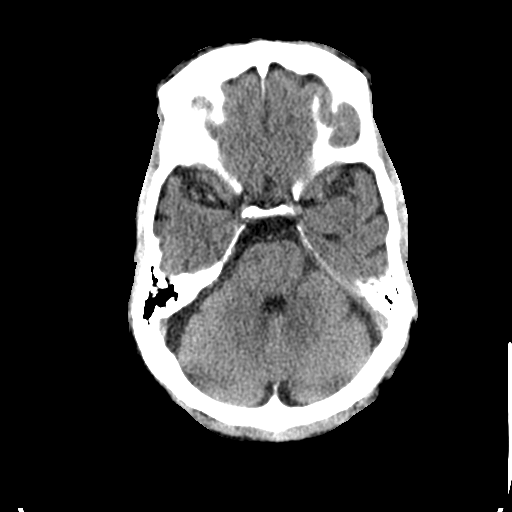
[im 10/31  brain]
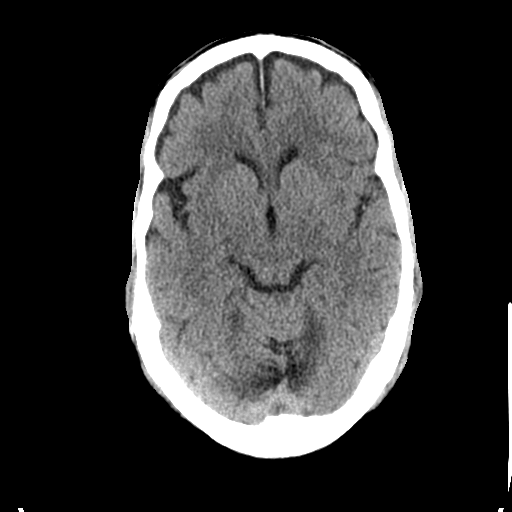
[im 13/31  brain]
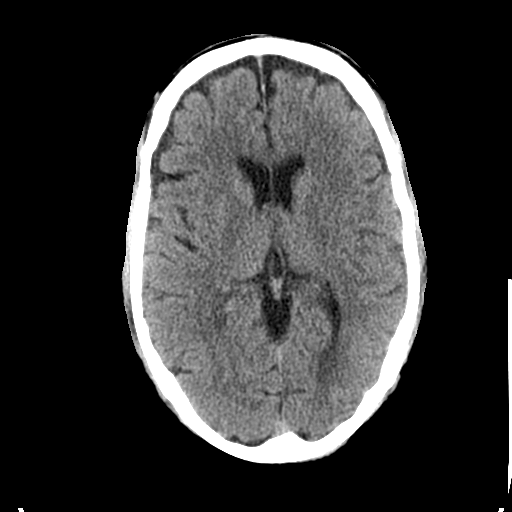
[im 16/31  brain]
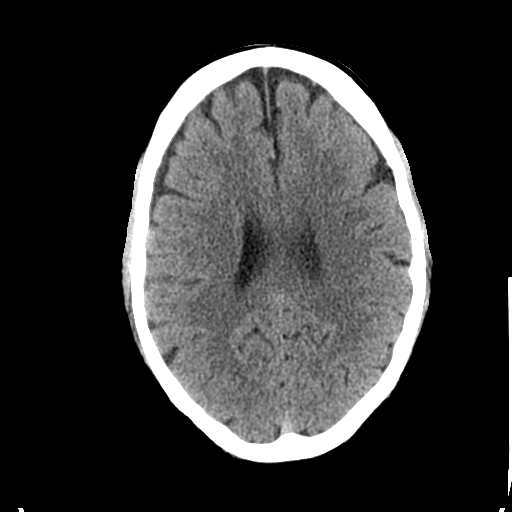
[im 16/31  bone]
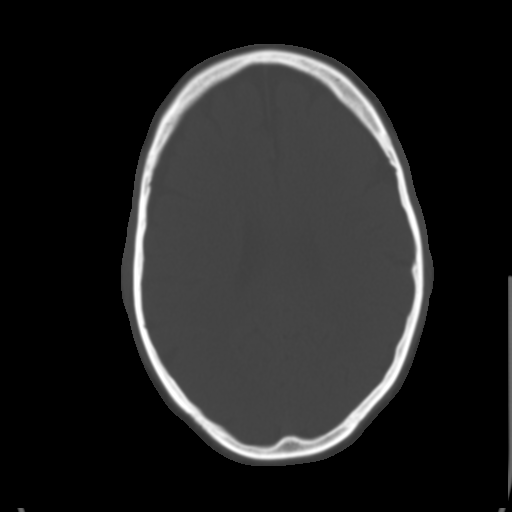
[im 19/31  brain]
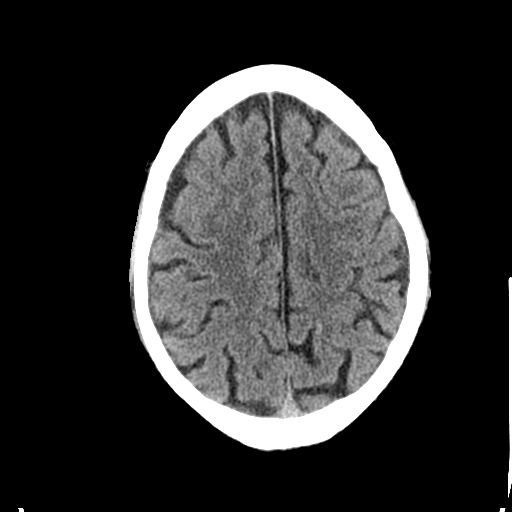
[im 22/31  brain]
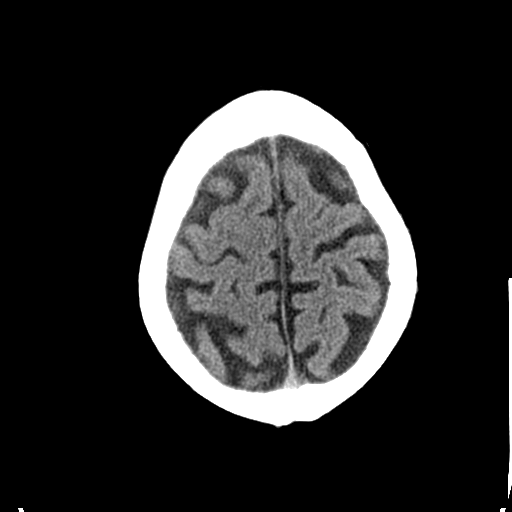
[im 25/31  brain]
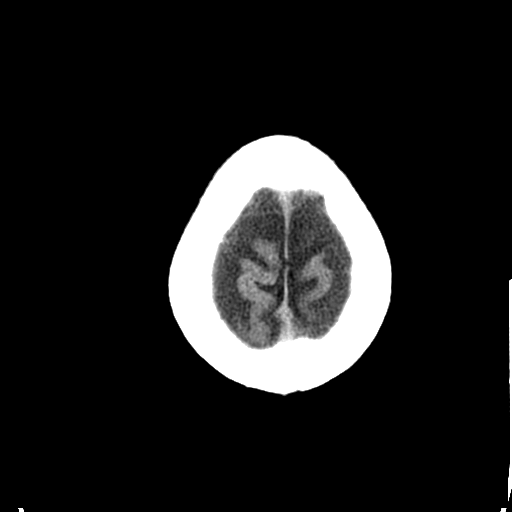
[im 28/31  brain]
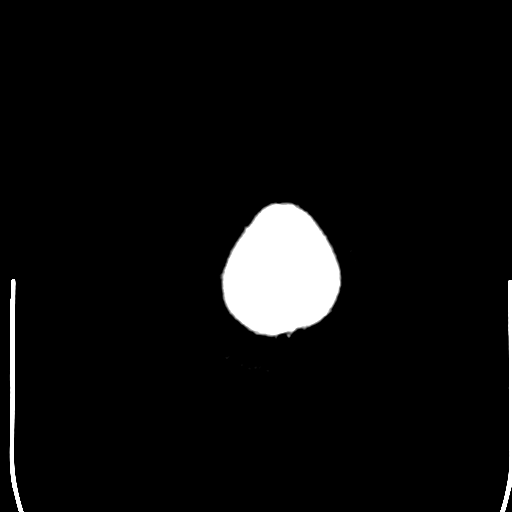
[im 28/31  bone]
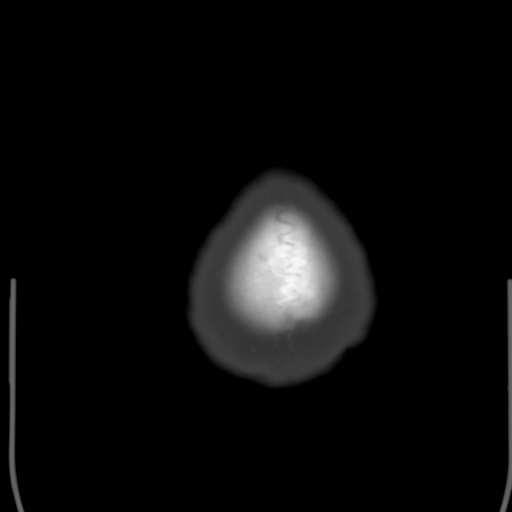

[Series 5: coronal · coronal · 0.29mm/px · 3 of 71 slices shown]
[im 24/71  brain]
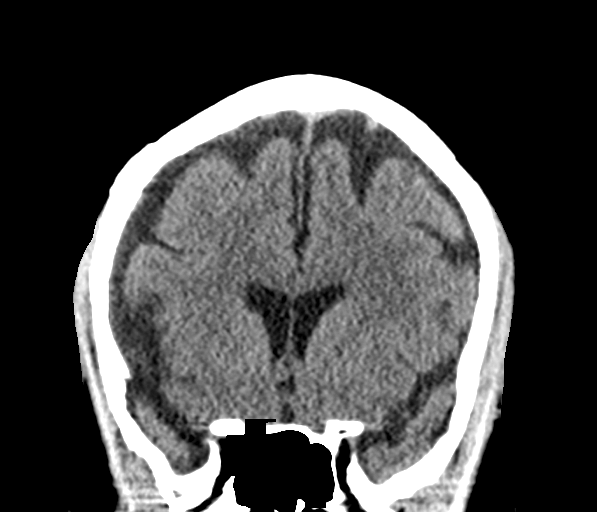
[im 32/71  brain]
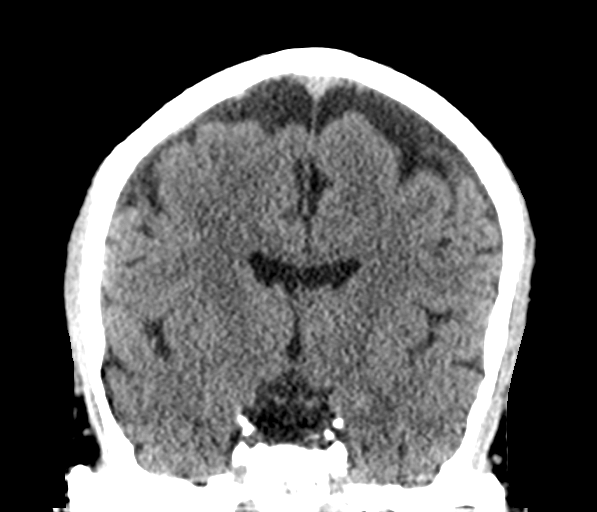
[im 39/71  brain]
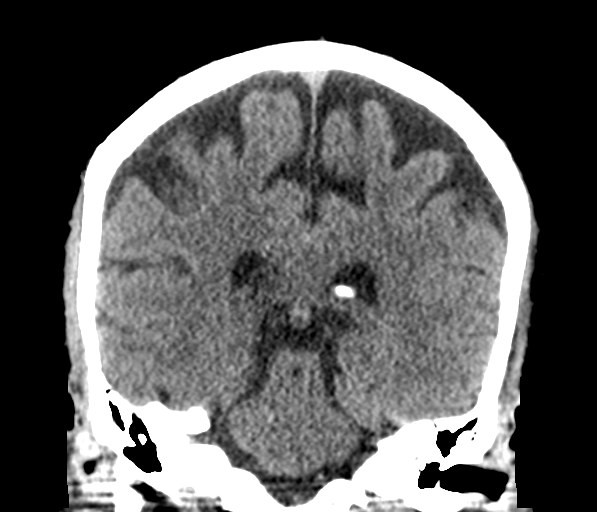

[Series 6: sagittal · sagittal · 0.28mm/px · 3 of 56 slices shown]
[im 19/56  brain]
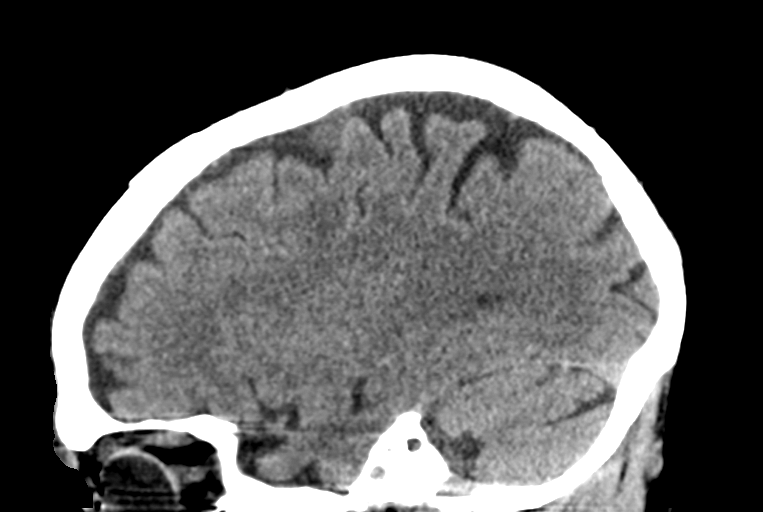
[im 28/56  brain]
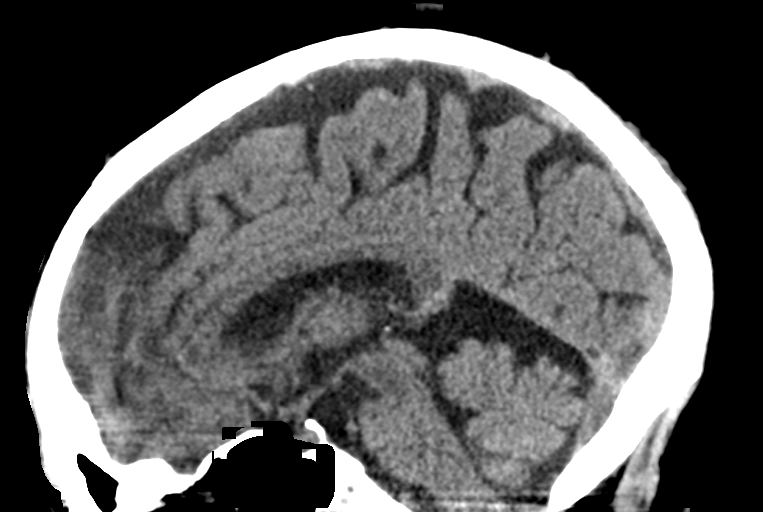
[im 37/56  brain]
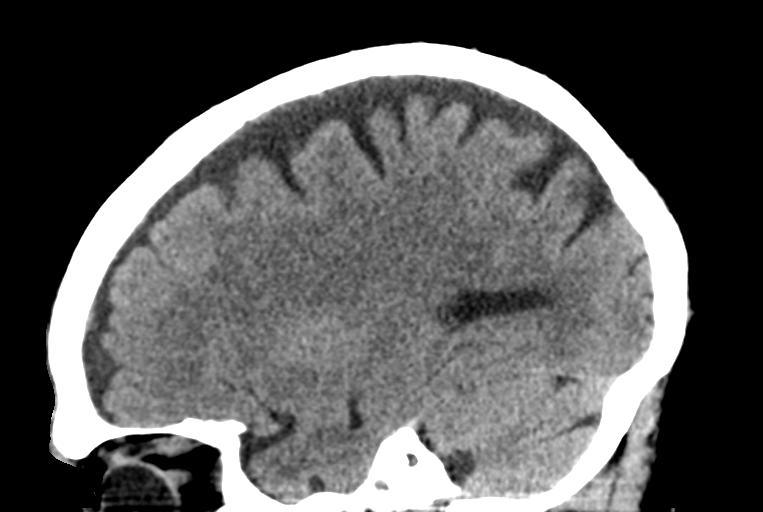

[15 of 47 positions shown; findings below may reference images not displayed]

FINDINGS: Brain: Mild diffuse cortical atrophy is noted. No mass effect or
midline shift is noted. Ventricular size is within normal limits.
There is no evidence of mass lesion, hemorrhage or acute infarction.

Vascular: No hyperdense vessel or unexpected calcification.

Skull: Normal. Negative for fracture or focal lesion.

Sinuses/Orbits: No acute finding.

Other: None.
IMPRESSION: Mild diffuse cortical atrophy. No acute intracranial abnormality
seen.

## 2017-09-19 IMAGING — CR DG CHEST 2V
2 series · 2 of 2 positions shown · non-contrast
Comparison: Radiograph December 12, 2015.

CLINICAL DATA: Chest pain.

EXAM:
CHEST  2 VIEW

[w chest lat]
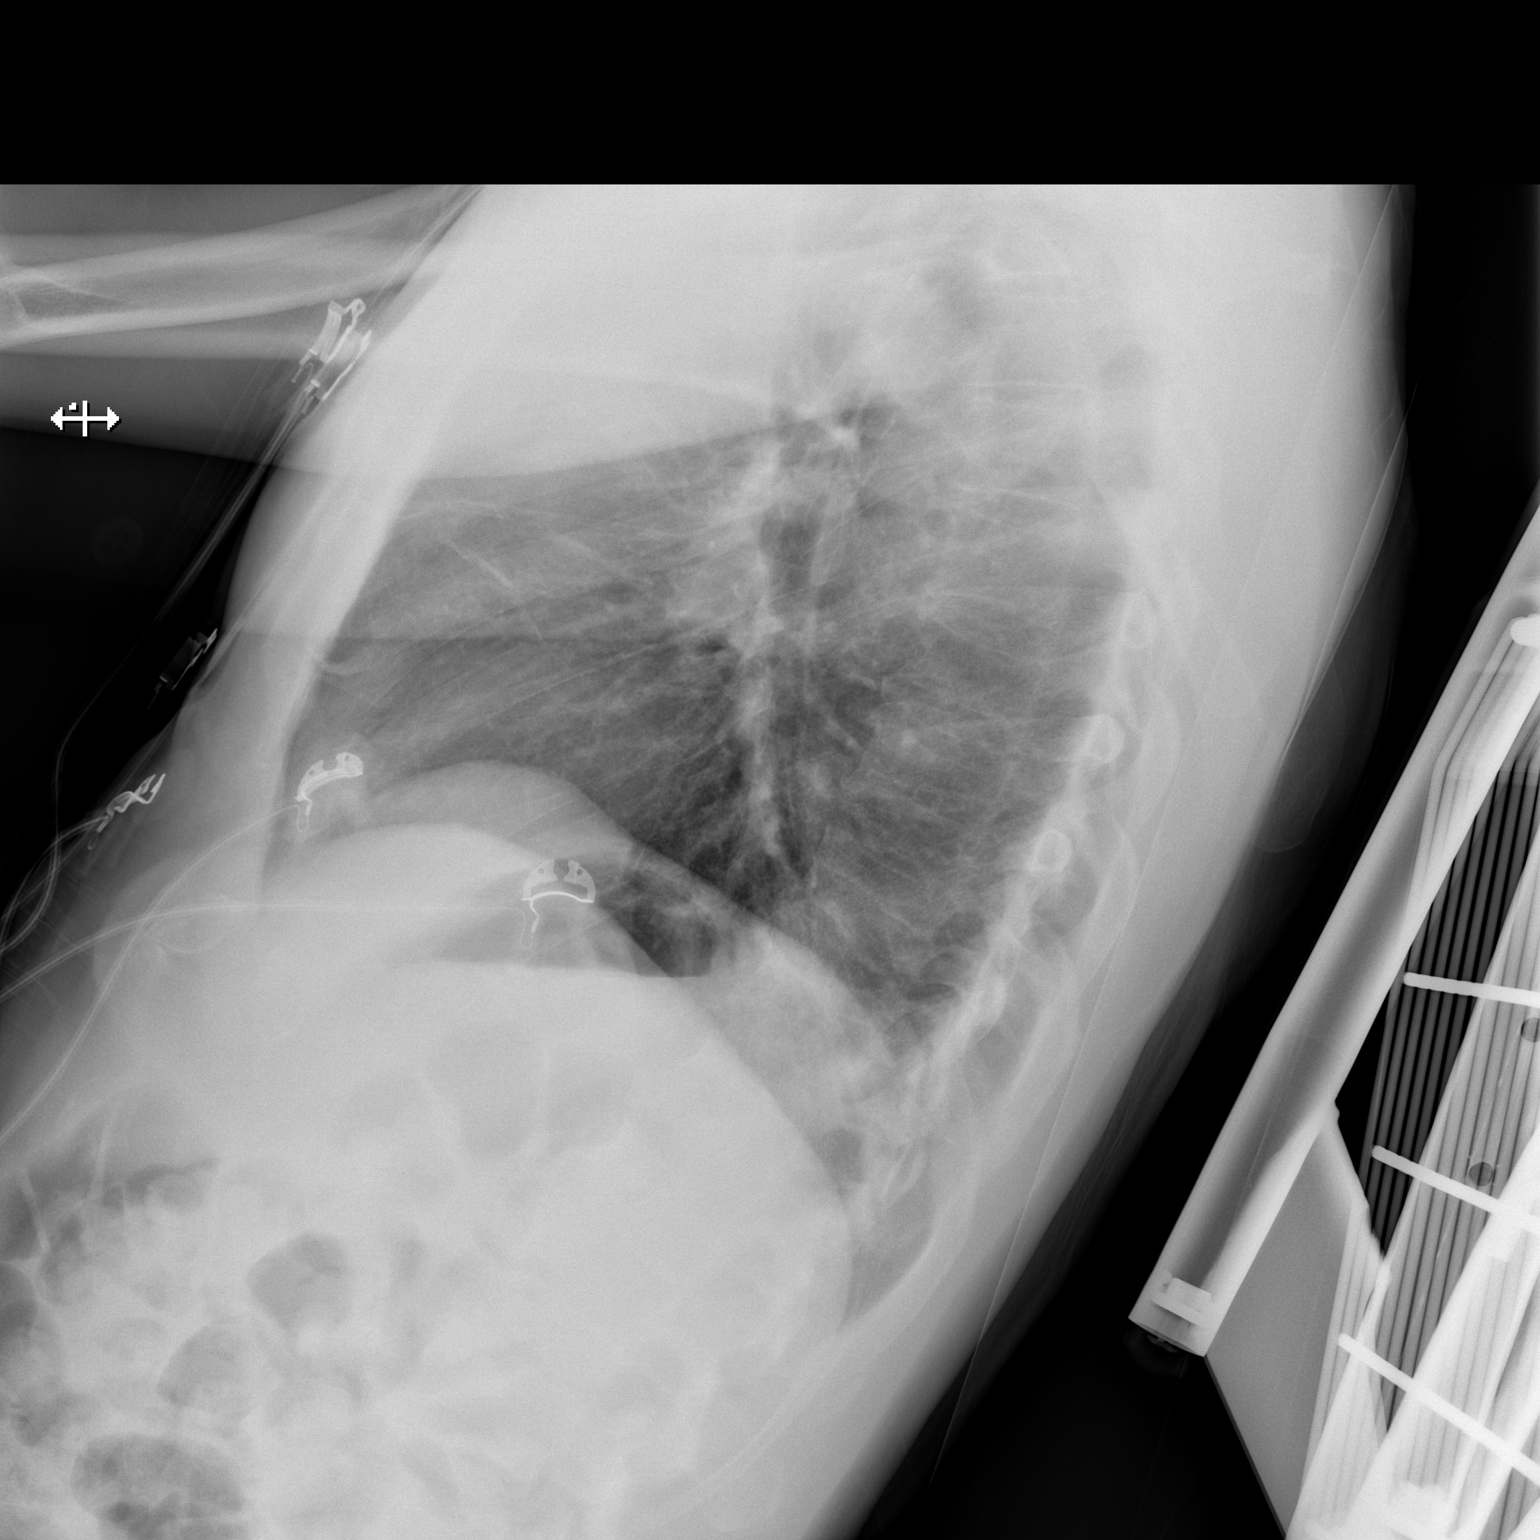

[x chest ap]
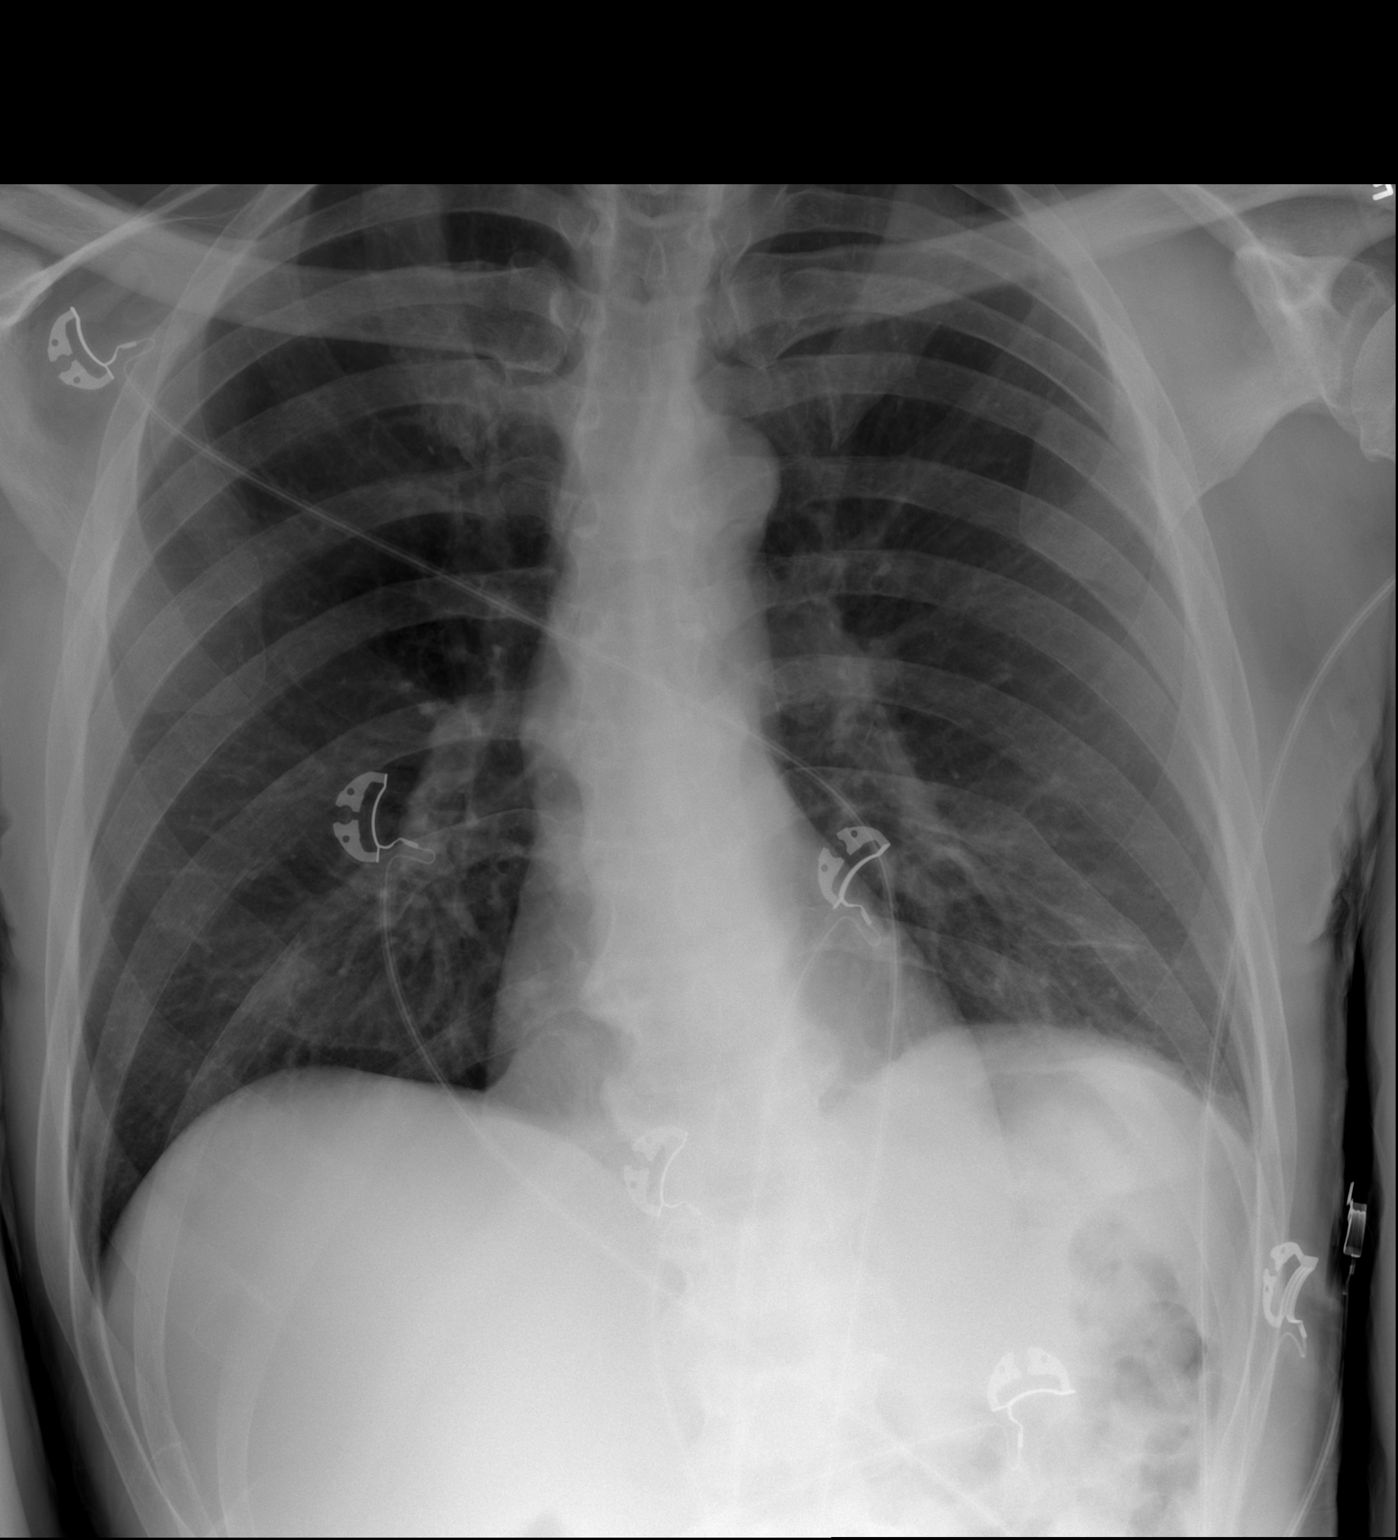

[2 of 2 positions shown; findings below may reference images not displayed]

FINDINGS: The heart size and mediastinal contours are within normal limits.
Both lungs are clear. No pneumothorax or pleural effusion is noted.
The visualized skeletal structures are unremarkable.
IMPRESSION: No active cardiopulmonary disease.

## 2017-10-29 ENCOUNTER — Telehealth: Payer: Self-pay | Admitting: Nurse Practitioner

## 2017-10-29 NOTE — Telephone Encounter (Signed)
Received labs drawn 10/23/2017 CBC within normal limits CMP within normal limits Dilantin level 14.5 Valproic acid level 64

## 2018-04-16 ENCOUNTER — Other Ambulatory Visit: Payer: Self-pay

## 2018-04-16 ENCOUNTER — Encounter (HOSPITAL_COMMUNITY): Payer: Self-pay

## 2018-04-16 ENCOUNTER — Emergency Department (HOSPITAL_COMMUNITY)
Admission: EM | Admit: 2018-04-16 | Discharge: 2018-04-16 | Disposition: A | Discharge status: Home / Self Care | Payer: Medicare (Managed Care) | Attending: Emergency Medicine | Admitting: Emergency Medicine

## 2018-04-16 DIAGNOSIS — F4324 Adjustment disorder with disturbance of conduct: Secondary | ICD-10-CM | POA: Insufficient documentation

## 2018-04-16 DIAGNOSIS — Z79899 Other long term (current) drug therapy: Secondary | ICD-10-CM | POA: Insufficient documentation

## 2018-04-16 DIAGNOSIS — E7800 Pure hypercholesterolemia, unspecified: Secondary | ICD-10-CM | POA: Diagnosis not present

## 2018-04-16 DIAGNOSIS — R4689 Other symptoms and signs involving appearance and behavior: Secondary | ICD-10-CM

## 2018-04-16 DIAGNOSIS — Z87891 Personal history of nicotine dependence: Secondary | ICD-10-CM | POA: Diagnosis not present

## 2018-04-16 DIAGNOSIS — Z8546 Personal history of malignant neoplasm of prostate: Secondary | ICD-10-CM | POA: Diagnosis not present

## 2018-04-16 DIAGNOSIS — I5032 Chronic diastolic (congestive) heart failure: Secondary | ICD-10-CM | POA: Insufficient documentation

## 2018-04-16 DIAGNOSIS — F70 Mild intellectual disabilities: Secondary | ICD-10-CM | POA: Insufficient documentation

## 2018-04-16 LAB — CBC WITH DIFFERENTIAL/PLATELET
Abs Immature Granulocytes: 0 10*3/uL (ref 0.00–0.07)
Basophils Absolute: 0 10*3/uL (ref 0.0–0.1)
Basophils Relative: 0 %
EOS ABS: 0.2 10*3/uL (ref 0.0–0.5)
EOS PCT: 4 %
HEMATOCRIT: 43.2 % (ref 39.0–52.0)
HEMOGLOBIN: 13.1 g/dL (ref 13.0–17.0)
LYMPHS ABS: 1.1 10*3/uL (ref 0.7–4.0)
LYMPHS PCT: 21 %
MCH: 33.6 pg (ref 26.0–34.0)
MCHC: 30.3 g/dL (ref 30.0–36.0)
MCV: 110.8 fL — AB (ref 80.0–100.0)
MONOS PCT: 10 %
Monocytes Absolute: 0.5 10*3/uL (ref 0.1–1.0)
Neutro Abs: 3.4 10*3/uL (ref 1.7–7.7)
Neutrophils Relative %: 65 %
Platelets: 176 10*3/uL (ref 150–400)
RBC: 3.9 MIL/uL — ABNORMAL LOW (ref 4.22–5.81)
RDW: 12.4 % (ref 11.5–15.5)
WBC: 5.2 10*3/uL (ref 4.0–10.5)
nRBC: 0 % (ref 0.0–0.2)
nRBC: 0 /100 WBC

## 2018-04-16 LAB — COMPREHENSIVE METABOLIC PANEL
ALBUMIN: 3.7 g/dL (ref 3.5–5.0)
ALK PHOS: 82 U/L (ref 38–126)
ALT: 21 U/L (ref 0–44)
AST: 40 U/L (ref 15–41)
Anion gap: 13 (ref 5–15)
BUN: 11 mg/dL (ref 8–23)
CALCIUM: 9.2 mg/dL (ref 8.9–10.3)
CO2: 21 mmol/L — AB (ref 22–32)
CREATININE: 0.79 mg/dL (ref 0.61–1.24)
Chloride: 106 mmol/L (ref 98–111)
GFR calc Af Amer: >60 mL/min (ref >60)
GFR calc non Af Amer: >60 mL/min (ref >60)
GLUCOSE: 118 mg/dL — AB (ref 70–99)
Potassium: 3.9 mmol/L (ref 3.5–5.1)
SODIUM: 140 mmol/L (ref 135–145)
Total Bilirubin: 0.8 mg/dL (ref 0.3–1.2)
Total Protein: 6.5 g/dL (ref 6.5–8.1)

## 2018-04-16 LAB — ETHANOL: Alcohol, Ethyl (B): <10 mg/dL (ref <10)

## 2018-04-16 MED ORDER — DUTASTERIDE 0.5 MG PO CAPS
0.5000 mg | ORAL_CAPSULE | ORAL | Status: DC
  Administered 2018-04-16: 0.5 mg via ORAL
  Filled 2018-04-16: qty 1

## 2018-04-16 MED ORDER — DIVALPROEX SODIUM ER 500 MG PO TB24
1500.0000 mg | ORAL_TABLET | Freq: Every day | ORAL | Status: DC
  Administered 2018-04-16: 1500 mg via ORAL
  Filled 2018-04-16: qty 3

## 2018-04-16 MED ORDER — PRAVASTATIN SODIUM 40 MG PO TABS: 40.0000 mg | ORAL_TABLET | Freq: Every day | ORAL | Status: DC

## 2018-04-16 MED ORDER — ACETAMINOPHEN 325 MG PO TABS: 650.0000 mg | ORAL_TABLET | ORAL | Status: DC | PRN

## 2018-04-16 MED ORDER — PHENYTOIN SODIUM EXTENDED 100 MG PO CAPS
200.0000 mg | ORAL_CAPSULE | Freq: Every day | ORAL | Status: DC
  Administered 2018-04-16: 200 mg via ORAL
  Filled 2018-04-16: qty 2

## 2018-04-16 MED ORDER — ADULT MULTIVITAMIN W/MINERALS CH: 1.0000 | ORAL_TABLET | Freq: Every day | ORAL | Status: DC

## 2018-04-16 MED ORDER — PHENYTOIN 50 MG PO CHEW
50.0000 mg | CHEWABLE_TABLET | Freq: Every day | ORAL | Status: DC
  Administered 2018-04-16: 50 mg via ORAL
  Filled 2018-04-16: qty 1

## 2018-04-16 NOTE — ED Notes (Signed)
Sister contacted and states she will come for pt but request he get bedtime meds as he missed all meds last night . Dr. Lita Mains aware and states pt can get meds.

## 2018-04-16 NOTE — ED Notes (Signed)
Rescind paperwork signed and faxed to magistrate with confirm transmission log returned.

## 2018-04-16 NOTE — Discharge Planning (Signed)
[name], SW [facility] [phone] called to give collateral information.  [name] to fax records to ED.

## 2018-04-16 NOTE — ED Notes (Signed)
Discharge instructions discussed with sister on phone and pt. Home via wc.

## 2018-04-16 NOTE — ED Notes (Signed)
Pt placed in suisters car and discharge instructions again reviewed with sister. Pt and sister both indicate understganding.

## 2018-04-16 NOTE — ED Provider Notes (Signed)
[facility] EMERGENCY DEPARTMENT Provider Note   CSN: [id number] Arrival date & time: 04/16/18  1335    History   Chief Complaint Chief Complaint  Patient presents with  . Medical Clearance    HPI Jeffery [name] is a 63 y.o. male.     HPI Patient presents via police after an episode of aggressive behavior. Patient has history of mental retardation, lives with family. The patient himself cannot provide substantial details of his HPI, but seemingly denies any complaints, discomfort. Level 5 caveat secondary to cognitive state. Reportedly the patient had an outburst with his sister, both verbal and physical prior to EMS notification. Please report that on their arrival the patient was swinging a barbell at them. There was an attempt to tase the patient, this was unsuccessful. The patient was then agreeable, for transport to this facility for evaluation. Seemingly the patient has been taking his medication as directed, though he cannot specify any of his medications. Past Medical History:  Diagnosis Date  . Cancer ([place])    PROSTATE CANCER--PROSTATE BIOPSY PLANNED EVERY 6 MONTHS--NO TX PLANNED UNLESS THE CANCER STARTS PROGRESSING-  . Hypercholesteremia    under control  . 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 developed retardation symptoms about age 54,? whether drug related. Start having seizures. Hx. ETOH abuse in past. -"cognitive issues-can not live alone"  . Seizures ([place])    last seizure 10/27/13  grande maul    Patient Active Problem List   Diagnosis Date Noted  . Seizure disorder ([place]) 06/19/2017  . Acute cystitis without hematuria   . Altered mental status 12/12/2015  . Metabolic encephalopathy 94/17/4081  . Congestive heart failure with left ventricular diastolic dysfunction ([place]) 05/28/2015  . Prostate cancer ([place]) 04/07/2015  . Profound intellectual disabilities 11/15/2012  . Encounter for therapeutic drug monitoring 11/15/2012  .  Routine general medical examination at a health care facility 10/02/2012  . Muscle spasm of back 09/19/2011  . Hearing difficulty 08/17/2011  . Hypercholesterolemia 08/17/2011  . Mild intellectual disability 01/30/2008  . Convulsions ([place]) 01/30/2008  . ELEVATED PROSTATE SPECIFIC ANTIGEN 01/30/2008    Past Surgical History:  Procedure Laterality Date  . PROSTATE BIOPSY  03/27/2011   Procedure: BIOPSY TRANSRECTAL ULTRASONIC PROSTATE (TUBP);  Surgeon: Molli Hazard, MD;  Location: [facility];  Service: Urology;  Laterality: N/A;  Prostatic Block       . PROSTATE BIOPSY  09/27/2011   Procedure: BIOPSY TRANSRECTAL ULTRASONIC PROSTATE (TUBP);  Surgeon: Molli Hazard, MD;  Location: [facility];  Service: Urology;  Laterality: N/A;       . PROSTATE BIOPSY N/A 10/29/2013   Procedure: BIOPSY TRANSRECTAL ULTRASONIC PROSTATE (TUBP);  Surgeon: Alexis Frock, MD;  Location: [facility];  Service: Urology;  Laterality: N/A;        Home Medications    Prior to Admission medications   Medication Sig Start Date End Date Taking? Authorizing Provider  AVODART 0.5 MG capsule Take 0.5 mg by mouth every other day.     [provider]  divalproex (DEPAKOTE ER) 500 MG 24 hr tablet Take 3 tablets (1,500 mg total) by mouth daily. 03/08/16   Dohmeier, Asencion Partridge, MD  Multiple Vitamin (MULTIVITAMIN WITH MINERALS) TABS tablet Take 1 tablet by mouth at bedtime.    [provider]  phenytoin (DILANTIN) 100 MG ER capsule Take 2 capsules (200 mg total) by mouth at bedtime. 03/08/16   Dohmeier, Asencion Partridge, MD  phenytoin (DILANTIN) 50 MG tablet Chew 1 tablet (50 mg  total) by mouth daily. 03/08/16   Dohmeier, Asencion Partridge, MD  pravastatin (PRAVACHOL) 40 MG tablet Take 1 tablet (40 mg total) by mouth at bedtime. 05/12/16   Vivi Barrack, MD    Family History Family History  Problem Relation Age of Onset  . Cancer Sister        breast  . Hypertension Sister   . Diabetes Sister   . Hypertension Sister      Social History Social History   Tobacco Use  . Smoking status: Former Smoker    Packs/day: 0.50    Years: 10.00    Pack years: 5.00    Types: Cigarettes  . Smokeless tobacco: Never Used  . Tobacco comment: SNEAKS A SMOKE EVERY NOW AND THEN  Substance Use Topics  . Alcohol use: No    Comment: none in 15 yrs,past ETOH abuse Sneaks liquor once every few months  . Drug use: No     Allergies   Patient has no known allergies.   Review of Systems Review of Systems  Unable to perform ROS: Psychiatric disorder     Physical Exam Updated Vital Signs BP 133/87   Pulse 81   Temp 97.8 F (36.6 C)   Resp 18   Ht 5\' 11"  (1.803 m)   Wt 72.6 kg   SpO2 96%   BMI 22.32 kg/m   Physical Exam Vitals signs and nursing note reviewed.  Constitutional:      General: He is not in acute distress.    Appearance: He is well-developed.     Comments: Thin adult male sitting on the edge of bed, animated, speaking with speech deficit  HENT:     Head: Normocephalic and atraumatic.  Eyes:     Conjunctiva/sclera: Conjunctivae normal.  Pulmonary:     Effort: Pulmonary effort is normal. No respiratory distress.     Breath sounds: No stridor.  Abdominal:     General: There is no distension.  Skin:    General: Skin is warm and dry.  Neurological:     Mental Status: He is alert.     Motor: No tremor.     Comments: Face is symmetric, speech is slurred, though consistent, with brief appropriate responses, to simple questions.  Patient moves all extremities spontaneously.  Psychiatric:        Cognition and Memory: Cognition is impaired.      ED Treatments / Results  Labs (all labs ordered are listed, but only abnormal results are displayed) Labs Reviewed  COMPREHENSIVE METABOLIC PANEL - Abnormal; Notable for the following components:      Result Value   CO2 21 (*)    Glucose, Bld 118 (*)    All other components within normal limits  CBC WITH DIFFERENTIAL/PLATELET - Abnormal;  Notable for the following components:   RBC 3.90 (*)    MCV 110.8 (*)    All other components within normal limits  ETHANOL    EKG None  Radiology No results found.  Procedures Procedures (including critical care time)  Medications Ordered in ED Medications - No data to display   Initial Impression / Assessment and Plan / ED Course  I have reviewed the triage vital signs and the nursing notes.  Pertinent labs & imaging results that were available during my care of the patient were reviewed by me and considered in my medical decision making (see chart for details).    Involuntary commitment papers reviewed.    3:34 PM Patient in no distress. This  elderly male with mental impairment presents after threatening, acting aggressively towards family members, and subsequently waving a barbell at please officers. The patient himself is minimally insightful into his presentation, but has no personal complaints, is hemodynamically unremarkable.  The patient medically medically cleared for psychiatric evaluation.  Final Clinical Impressions(s) / ED Diagnoses  Agitation   Carmin Muskrat, MD 04/16/18 1535

## 2018-04-16 NOTE — ED Notes (Signed)
Pt given fluids and [place] sandwich and tolerating well

## 2018-04-16 NOTE — ED Notes (Signed)
Pt has varied response to psych exam. Uncertain that pt is understanding questions due to difficult speech and hearing issues.

## 2018-04-16 NOTE — ED Notes (Signed)
Pt placed in wine scrubs and belongings removed. [employer] remain at bedside

## 2018-04-16 NOTE — ED Notes (Signed)
Pt given belongings and got dressed

## 2018-04-16 NOTE — BH Assessment (Addendum)
Tele Assessment Note   Patient Name: Jeffery [name] MRN: [id number] Referring Physician: Carmin Muskrat, MD Location of Patient: [facility] Location of Provider: [facility]  Jeffery [name] is a single  63 y.o. male who presents involuntarily to [facility] via police after an altercation with his sister. 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 Pt lives with his sister, Jeffery [name] & family. Sister reports by telephone that pt is normally calm and never aggressive. She suspects pt is having difficulty with schedule changes due to the CV-19 quarantine. Pt attends a day program, [facility], 3 x weekly. Sister states he enjoys going and she thinks he does not believe her that it is closed for a while. Jeffery [name] reports she is going to try a different method to their morning routine. She states she feels comfortable with pt coming home & isn't concerned about any safety issues. Sister would like pt to be reminded before he leaves the hospital that he could have gotten hurt today.   Pt & sister report medication compliance. Pt denies SI & HI. Sister corroborates, stating she has never known pt to have SI or make an attempt. Pt states current stressors include wanting to go to his "school".  Pt has fair insight and judgment. UTA pt's memory. Legal history includes no current charges.  MSE: Pt is dressed in scrubs, alert, oriented with loud speech and normal motor behavior. Eye contact is good. Pt's mood is pleasant and affect is appropriate to circumstance. Affect is congruent with mood. Thought process is relevant and preoccupied. Pt repeated himself often. There is no indication pt is currently responding to internal stimuli or experiencing delusional thought content. Pt was cooperative throughout assessment.   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 Cancer ([place])    PROSTATE CANCER--PROSTATE BIOPSY PLANNED EVERY 6 MONTHS--NO TX PLANNED UNLESS THE CANCER STARTS PROGRESSING-  . Hypercholesteremia    under control  . 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 developed retardation symptoms about age 28,? whether drug related. Start having seizures. Hx. ETOH abuse in past. -"cognitive issues-can not live alone"  . Seizures ([place])    last seizure 10/27/13  grande maul    Past Surgical History:  Procedure Laterality Date  . PROSTATE BIOPSY  03/27/2011   Procedure: BIOPSY TRANSRECTAL ULTRASONIC PROSTATE (TUBP);  Surgeon: Jeffery Hazard, MD;  [facility];  Service: Urology;  Laterality: N/A;  Prostatic Block       . PROSTATE BIOPSY  09/27/2011   Procedure: BIOPSY TRANSRECTAL ULTRASONIC PROSTATE (TUBP);  Surgeon: Jeffery Hazard, MD;  [facility];  Service: Urology;  Laterality: N/A;       . PROSTATE BIOPSY N/A 10/29/2013   Procedure: BIOPSY TRANSRECTAL ULTRASONIC PROSTATE (TUBP);  Surgeon: Jeffery Frock, MD;  [facility];  Service: Urology;  Laterality: N/A;    Family History:  Family History  Problem Relation Age of Onset  . Cancer Sister        breast  . Hypertension Sister   . Diabetes Sister   . Hypertension Sister     Social History:  reports that he has quit smoking. His smoking use included cigarettes. He has a 5.00 pack-year smoking history. He has never used smokeless tobacco. He reports that he does not drink alcohol or use drugs.  Additional Social History:  Alcohol / Drug Use Pain Medications: See MAR Prescriptions: See MAR Over the Counter: See MAR History of alcohol / drug use?: Yes Longest period of sobriety (when/how long): has not used alcohol in many years now  CIWA: CIWA-Ar BP: (!) 129/96 Pulse Rate: 94 COWS:    Allergies: No Known Allergies  Home Medications: (Not in a hospital admission)   OB/GYN Status:  No LMP for male  patient.  General Assessment Data Location of Assessment: [facility] ED TTS Assessment: In system Is this a Tele or Face-to-Face Assessment?: Tele Assessment Is this an Initial Assessment or a Re-assessment for this encounter?: Initial Assessment Patient Accompanied by:: (sister- Jeffery [name] provided info by phone) Language Other than English: No Living Arrangements: Other (Comment) What gender do you identify as?: Male Marital status: Single Living Arrangements: (sister and brother-in-law) Can pt return to current living arrangement?: Yes Admission Status: Involuntary Petitioner: Police Is patient capable of signing voluntary admission?: No Referral Source: MD Insurance type: [facility]     Crisis Care Plan Living Arrangements: (sister and brother-in-law) Legal Guardian: Other relative(sister, Jeffery [name]) Name of Psychiatrist: none Name of Therapist: none  Education Status Is patient currently in school?: No Is the patient employed, unemployed or receiving disability?: Receiving disability income  Risk to self with the past 6 months Suicidal Ideation: No Has patient been a risk to self within the past 6 months prior to admission? : No Suicidal Intent: No Has patient had any suicidal intent within the past 6 months prior to admission? : No Is patient at risk for suicide?: No Suicidal Plan?: No Has patient had any suicidal plan within the past 6 months prior to admission? : No What has been your use of drugs/alcohol within the last 12 months?: none Previous Attempts/Gestures: No How many times?: 0 Intentional Self Injurious Behavior: None Family Suicide History: No Recent stressful life event(s): (routine disrupted due to quarantine) Persecutory voices/beliefs?: No Depression: No Substance abuse history and/or treatment for substance abuse?: Yes(alcohol in past)  Risk to Others within the past 6 months Homicidal Ideation: No Does patient have any lifetime risk of  violence toward others beyond the six months prior to admission? : Yes (comment)(raised arm at sister but did not hit; swung barbell at [employer]) Thoughts of Harm to Others: No-Not Currently Present/Within Last 6 Months Current Homicidal Intent: No Current Homicidal Plan: No Access to Homicidal Means: No History of harm to others?: No Assessment of Violence: On admission Violent Behavior Description: cursing and raised his hand to sister; later tried to hit police with barbell Does patient have access to weapons?: No Criminal Charges Pending?: No Does patient have a court date: No Is patient on probation?: No  Psychosis Hallucinations: None noted Delusions: None noted  Mental Status Report Appearance/Hygiene: Unremarkable, In scrubs Eye Contact: Good Motor Activity: Freedom of movement Speech: Logical/coherent Level of Consciousness: Alert Mood: Pleasant, Preoccupied Affect: Appropriate to circumstance Anxiety Level: Minimal Thought Processes: Relevant  Cognitive Functioning Concentration: Fair Memory: Remote Impaired, Recent Impaired Is patient IDD: Yes Level of Function: profound Insight: Poor Impulse Control: Fair Appetite: Good Have you had any weight changes? : No Change Sleep: No Change Total Hours of Sleep: 6 Vegetative Symptoms: None  ADLScreening [facility] Assessment Services) Patient's cognitive ability adequate to safely complete daily activities?: Yes Patient able to express need for assistance with ADLs?: Yes Independently performs ADLs?: Yes (appropriate for developmental age)  Prior Inpatient Therapy Prior Inpatient Therapy: No  Prior Outpatient Therapy Prior Outpatient Therapy:  No(attends an adult day program [facility], 3X weekly) Does patient have Intensive In-House Services?  : No Does patient have [facility] services? : No Does patient have P4CC services?: No  ADL Screening (condition at time of admission) Patient's cognitive ability adequate to safely  complete daily activities?: Yes Is the patient deaf or have difficulty hearing?: Yes Does the patient have difficulty concentrating, remembering, or making decisions?: Yes Patient able to express need for assistance with ADLs?: Yes Does the patient have difficulty dressing or bathing?: No Independently performs ADLs?: Yes (appropriate for developmental age)       Abuse/Neglect Assessment (Assessment to be complete while patient is alone) Abuse/Neglect Assessment Can Be Completed: Unable to assess, patient is non-responsive or altered mental status   Consults Social Work Consult Needed: No Regulatory affairs officer (For Healthcare) Does Patient Have a Catering manager?: No Would patient like information on creating a medical advance directive?: No - Patient declined          Disposition: Jeffery Rankin, NP recommends pt be psychiatrically cleared Disposition Initial Assessment Completed for this Encounter: Yes Disposition of Patient: (observe overnight for safety and stabilization) XX accurate recommended disposition is psych clearance XX  This service was provided via telemedicine using a 2-way, interactive audio and Radiographer, therapeutic.   Krishon Adkison H Kajol Crispen 04/16/2018 5:29 PM

## 2018-04-16 NOTE — ED Triage Notes (Signed)
Pt arrives ambulatory with [facility] who state he was removed at home for acting out.  Became aggressive with officers swinging dumbell. Officers attempted taser but did not connect. Pt is very hard of hearing second to remote TBI. Officers awaiting IVC paperwork.

## 2018-06-24 ENCOUNTER — Ambulatory Visit: Payer: Medicare (Managed Care) | Admitting: Nurse Practitioner

## 2018-06-25 ENCOUNTER — Telehealth: Payer: Self-pay | Admitting: Neurology

## 2018-06-25 NOTE — Telephone Encounter (Signed)
Patient will come in as in office. Reviewed our process of checking in during this time.

## 2018-06-27 ENCOUNTER — Other Ambulatory Visit: Payer: Self-pay

## 2018-06-27 ENCOUNTER — Ambulatory Visit (INDEPENDENT_AMBULATORY_CARE_PROVIDER_SITE_OTHER): Payer: Medicare (Managed Care) | Admitting: Neurology

## 2018-06-27 ENCOUNTER — Encounter: Payer: Self-pay | Admitting: Neurology

## 2018-06-27 VITALS — BP 113/71 | HR 73 | Temp 96.8°F | Ht 70.0 in | Wt 150.0 lb

## 2018-06-27 DIAGNOSIS — C61 Malignant neoplasm of prostate: Secondary | ICD-10-CM

## 2018-06-27 DIAGNOSIS — I5032 Chronic diastolic (congestive) heart failure: Secondary | ICD-10-CM

## 2018-06-27 DIAGNOSIS — Z79899 Other long term (current) drug therapy: Secondary | ICD-10-CM

## 2018-06-27 DIAGNOSIS — G40909 Epilepsy, unspecified, not intractable, without status epilepticus: Secondary | ICD-10-CM | POA: Diagnosis not present

## 2018-06-27 DIAGNOSIS — Z5181 Encounter for therapeutic drug level monitoring: Secondary | ICD-10-CM

## 2018-06-27 DIAGNOSIS — G809 Cerebral palsy, unspecified: Secondary | ICD-10-CM

## 2018-06-27 MED ORDER — DIVALPROEX SODIUM ER 500 MG PO TB24: 1500.0000 mg | ORAL_TABLET | Freq: Every day | ORAL | 4 refills | Status: DC

## 2018-06-27 MED ORDER — PHENYTOIN SODIUM EXTENDED 100 MG PO CAPS: 200.0000 mg | ORAL_CAPSULE | Freq: Every day | ORAL | 3 refills | Status: DC

## 2018-06-27 MED ORDER — PHENYTOIN 50 MG PO CHEW: 50.0000 mg | CHEWABLE_TABLET | Freq: Every day | ORAL | 3 refills | Status: DC

## 2018-06-27 NOTE — Addendum Note (Signed)
Addended by: Inis Sizer D on: 06/27/2018 03:11 PM   Modules accepted: Orders
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
## 2018-07-01 LAB — COMPREHENSIVE METABOLIC PANEL
ALT: 18 IU/L (ref 0–44)
AST: 15 IU/L (ref 0–40)
Albumin/Globulin Ratio: 1.5 (ref 1.2–2.2)
Albumin: 4 g/dL (ref 3.8–4.8)
Alkaline Phosphatase: 84 IU/L (ref 39–117)
BUN/Creatinine Ratio: 19 (ref 10–24)
BUN: 17 mg/dL (ref 8–27)
Bilirubin Total: 0.3 mg/dL (ref 0.0–1.2)
CO2: 25 mmol/L (ref 20–29)
Calcium: 9.1 mg/dL (ref 8.6–10.2)
Chloride: 103 mmol/L (ref 96–106)
Creatinine, Ser: 0.88 mg/dL (ref 0.76–1.27)
GFR calc Af Amer: 104 mL/min/{1.73_m2} (ref >59)
GFR calc non Af Amer: 90 mL/min/{1.73_m2} (ref >59)
Globulin, Total: 2.6 g/dL (ref 1.5–4.5)
Glucose: 67 mg/dL (ref 65–99)
Potassium: 4.3 mmol/L (ref 3.5–5.2)
Sodium: 141 mmol/L (ref 134–144)
Total Protein: 6.6 g/dL (ref 6.0–8.5)

## 2018-07-01 LAB — PHENYTOIN LEVEL, FREE AND TOTAL
Phenytoin, Free: 1.5 ug/mL (ref 1.0–2.0)
Phenytoin, Serum: 14.8 ug/mL (ref 10.0–20.0)

## 2018-07-01 LAB — VALPROIC ACID LEVEL: Valproic Acid Lvl: 60 ug/mL (ref 50–100)

## 2018-07-02 ENCOUNTER — Telehealth: Payer: Self-pay | Admitting: Neurology

## 2018-07-02 NOTE — Telephone Encounter (Signed)
Called the patient care taker and informed him the pt's lab work was in normal range and for the pt to continue with taking his medications. Pt's caretaker verbalized understanding and had no questions.

## 2018-07-02 NOTE — Telephone Encounter (Signed)
-----   Message from Larey Seat, MD sent at 07/01/2018  4:17 PM EDT ----- normal Dilantin and Valproate level. Normal CMET

## 2018-12-10 ENCOUNTER — Emergency Department (HOSPITAL_COMMUNITY)
Admission: EM | Admit: 2018-12-10 | Discharge: 2018-12-10 | Disposition: A | Discharge status: Home / Self Care | Payer: Medicare (Managed Care) | Attending: Emergency Medicine | Admitting: Emergency Medicine

## 2018-12-10 ENCOUNTER — Other Ambulatory Visit: Payer: Self-pay

## 2018-12-10 ENCOUNTER — Encounter (HOSPITAL_COMMUNITY): Payer: Self-pay | Admitting: Emergency Medicine

## 2018-12-10 DIAGNOSIS — R4182 Altered mental status, unspecified: Secondary | ICD-10-CM | POA: Diagnosis present

## 2018-12-10 DIAGNOSIS — G809 Cerebral palsy, unspecified: Secondary | ICD-10-CM | POA: Insufficient documentation

## 2018-12-10 DIAGNOSIS — Z79899 Other long term (current) drug therapy: Secondary | ICD-10-CM | POA: Diagnosis not present

## 2018-12-10 DIAGNOSIS — F79 Unspecified intellectual disabilities: Secondary | ICD-10-CM | POA: Insufficient documentation

## 2018-12-10 DIAGNOSIS — Z720 Tobacco use: Secondary | ICD-10-CM | POA: Insufficient documentation

## 2018-12-10 DIAGNOSIS — R404 Transient alteration of awareness: Secondary | ICD-10-CM | POA: Insufficient documentation

## 2018-12-10 DIAGNOSIS — Z8546 Personal history of malignant neoplasm of prostate: Secondary | ICD-10-CM | POA: Diagnosis not present

## 2018-12-10 LAB — CBC WITH DIFFERENTIAL/PLATELET
Abs Immature Granulocytes: 0.01 10*3/uL (ref 0.00–0.07)
Basophils Absolute: 0 10*3/uL (ref 0.0–0.1)
Basophils Relative: 0 %
Eosinophils Absolute: 0.1 10*3/uL (ref 0.0–0.5)
Eosinophils Relative: 3 %
HCT: 42.8 % (ref 39.0–52.0)
Hemoglobin: 13.4 g/dL (ref 13.0–17.0)
Immature Granulocytes: 0 %
Lymphocytes Relative: 26 %
Lymphs Abs: 1.2 10*3/uL (ref 0.7–4.0)
MCH: 32.9 pg (ref 26.0–34.0)
MCHC: 31.3 g/dL (ref 30.0–36.0)
MCV: 105.2 fL — ABNORMAL HIGH (ref 80.0–100.0)
Monocytes Absolute: 0.4 10*3/uL (ref 0.1–1.0)
Monocytes Relative: 8 %
Neutro Abs: 2.8 10*3/uL (ref 1.7–7.7)
Neutrophils Relative %: 63 %
Platelets: ADEQUATE 10*3/uL (ref 150–400)
RBC: 4.07 MIL/uL — ABNORMAL LOW (ref 4.22–5.81)
RDW: 12.7 % (ref 11.5–15.5)
WBC: 4.5 10*3/uL (ref 4.0–10.5)
nRBC: 0 % (ref 0.0–0.2)

## 2018-12-10 LAB — COMPREHENSIVE METABOLIC PANEL
ALT: 27 U/L (ref 0–44)
AST: 21 U/L (ref 15–41)
Albumin: 3.7 g/dL (ref 3.5–5.0)
Alkaline Phosphatase: 78 U/L (ref 38–126)
Anion gap: 8 (ref 5–15)
BUN: 18 mg/dL (ref 8–23)
CO2: 27 mmol/L (ref 22–32)
Calcium: 9.4 mg/dL (ref 8.9–10.3)
Chloride: 108 mmol/L (ref 98–111)
Creatinine, Ser: 0.73 mg/dL (ref 0.61–1.24)
GFR calc Af Amer: >60 mL/min (ref >60)
GFR calc non Af Amer: >60 mL/min (ref >60)
Glucose, Bld: 94 mg/dL (ref 70–99)
Potassium: 4.4 mmol/L (ref 3.5–5.1)
Sodium: 143 mmol/L (ref 135–145)
Total Bilirubin: 0.6 mg/dL (ref 0.3–1.2)
Total Protein: 6.8 g/dL (ref 6.5–8.1)

## 2018-12-10 LAB — URINALYSIS, ROUTINE W REFLEX MICROSCOPIC
Bilirubin Urine: NEGATIVE
Glucose, UA: NEGATIVE mg/dL
Hgb urine dipstick: NEGATIVE
Ketones, ur: 5 mg/dL — AB
Nitrite: NEGATIVE
Protein, ur: NEGATIVE mg/dL
Specific Gravity, Urine: 1.015 (ref 1.005–1.030)
pH: 7 (ref 5.0–8.0)

## 2018-12-10 LAB — PHENYTOIN LEVEL, TOTAL: Phenytoin Lvl: 17.4 ug/mL (ref 10.0–20.0)

## 2018-12-10 LAB — VALPROIC ACID LEVEL: Valproic Acid Lvl: 60 ug/mL (ref 50.0–100.0)

## 2018-12-10 NOTE — ED Triage Notes (Signed)
Pt here from home according to EMS and family for aloc and possible seizure , pt does not talk but is awake and alert

## 2018-12-10 NOTE — ED Provider Notes (Signed)
[facility] EMERGENCY DEPARTMENT Provider Note   CSN: [id number] Arrival date & time: 12/10/18  1035     History   Chief Complaint No chief complaint on file.   HPI Jeffery [name] is a 63 y.o. male.     63yo M w/ PMH including prostate CA, MR, seizures, HLD, CP who p/w AMS.  Sister is at bedside, lives with the patient and reports that this morning she went in to greet him and he was standing up, leaning on a dresser, and did not respond verbally when she said "good morning."  She then tried to direct him to sit down because he seemed like his legs were about to buckle but he was not really following directions well.  She was able to get him sitting down but he still was not talking to her so she called EMS.  Currently in the ED he seems to be at his baseline.  She reports that he has been receiving all of his medications as prescribed and has not had any recent changes to medications.  He has been in his usual state of health recently with no fevers, vomiting, cough/cold symptoms, or recent illness.  LEVEL 5 CAVEAT DUE TO MENTAL RETARDATION  The history is provided by a relative.    Past Medical History:  Diagnosis Date  . Cancer ([place])    PROSTATE CANCER--PROSTATE BIOPSY PLANNED EVERY 6 MONTHS--NO TX PLANNED UNLESS THE CANCER STARTS PROGRESSING-  . Hypercholesteremia    under control  . 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 developed retardation symptoms about age 76,? whether drug related. Start having seizures. Hx. ETOH abuse in past. -"cognitive issues-can not live alone"  . Seizures ([place])    last seizure 10/27/13  grande maul    Patient Active Problem List   Diagnosis Date Noted  . Cerebral palsy ([place]) 06/27/2018  . Seizure disorder ([place]) 06/19/2017  . Acute cystitis without hematuria   . Altered mental status 12/12/2015  . Metabolic encephalopathy 123456  . Congestive heart failure with left ventricular diastolic dysfunction ([place])  05/28/2015  . Prostate cancer ([place]) 04/07/2015  . Profound intellectual disabilities 11/15/2012  . Encounter for therapeutic drug monitoring 11/15/2012  . Routine general medical examination at a health care facility 10/02/2012  . Muscle spasm of back 09/19/2011  . Hearing difficulty 08/17/2011  . Hypercholesterolemia 08/17/2011  . Mild intellectual disability 01/30/2008  . Convulsions ([place]) 01/30/2008  . ELEVATED PROSTATE SPECIFIC ANTIGEN 01/30/2008    Past Surgical History:  Procedure Laterality Date  . PROSTATE BIOPSY  03/27/2011   Procedure: BIOPSY TRANSRECTAL ULTRASONIC PROSTATE (TUBP);  Surgeon: Molli Hazard, MD;  [facility];  Service: Urology;  Laterality: N/A;  Prostatic Block       . PROSTATE BIOPSY  09/27/2011   Procedure: BIOPSY TRANSRECTAL ULTRASONIC PROSTATE (TUBP);  Surgeon: Molli Hazard, MD;  Location: [facility];  Service: Urology;  Laterality: N/A;       . PROSTATE BIOPSY N/A 10/29/2013   Procedure: BIOPSY TRANSRECTAL ULTRASONIC PROSTATE (TUBP);  Surgeon: Alexis Frock, MD;  Location: [facility];  Service: Urology;  Laterality: N/A;        Home Medications    Prior to Admission medications   Medication Sig Start Date End Date Taking? Authorizing Provider  AVODART 0.5 MG capsule Take 0.5 mg by mouth every other day.     [provider]  divalproex (DEPAKOTE ER) 500 MG 24 hr tablet Take 3 tablets (1,500 mg total) by mouth  daily. 06/27/18   Dohmeier, Asencion Partridge, MD  Multiple Vitamin (MULTIVITAMIN WITH MINERALS) TABS tablet Take 1 tablet by mouth at bedtime.    [provider]  phenytoin (DILANTIN) 100 MG ER capsule Take 2 capsules (200 mg total) by mouth at bedtime. 06/27/18   Dohmeier, Asencion Partridge, MD  phenytoin (DILANTIN) 50 MG tablet Chew 1 tablet (50 mg total) by mouth daily. 06/27/18   Dohmeier, Asencion Partridge, MD  pravastatin (PRAVACHOL) 40 MG tablet Take 1 tablet (40 mg total) by mouth at bedtime. 05/12/16   Vivi Barrack, MD    Family  History Family History  Problem Relation Age of Onset  . Cancer Sister        breast  . Hypertension Sister   . Diabetes Sister   . Hypertension Sister     Social History Social History   Tobacco Use  . Smoking status: Former Smoker    Packs/day: 0.50    Years: 10.00    Pack years: 5.00    Types: Cigarettes  . Smokeless tobacco: Never Used  . Tobacco comment: SNEAKS A SMOKE EVERY NOW AND THEN  Substance Use Topics  . Alcohol use: No    Comment: none in 15 yrs,past ETOH abuse Sneaks liquor once every few months  . Drug use: No     Allergies   Patient has no known allergies.   Review of Systems Review of Systems  Unable to perform ROS: Other  mental retardation  Physical Exam Updated Vital Signs BP (!) 125/97   Pulse 76   Temp 98.4 F (36.9 C)   Resp 14   SpO2 99%   Physical Exam Vitals signs and nursing note reviewed.  Constitutional:      General: He is not in acute distress.    Appearance: He is well-developed.  HENT:     Head: Normocephalic and atraumatic.  Eyes:     Conjunctiva/sclera: Conjunctivae normal.     Pupils: Pupils are equal, round, and reactive to light.  Neck:     Musculoskeletal: Neck supple.  Cardiovascular:     Rate and Rhythm: Normal rate and regular rhythm.     Heart sounds: Normal heart sounds. No murmur.  Pulmonary:     Effort: Pulmonary effort is normal.     Breath sounds: Normal breath sounds.  Abdominal:     General: Bowel sounds are normal.     Palpations: Abdomen is soft.     Tenderness: There is no abdominal tenderness.     Comments: Mild distension  Skin:    General: Skin is warm and dry.  Neurological:     Mental Status: He is alert.     Comments: Alert, stated "I need to go to the bathroom," intermittently can follow basic commands; some contractures of hand muscles b/l      ED Treatments / Results  Labs (all labs ordered are listed, but only abnormal results are displayed) Labs Reviewed  CBC WITH  DIFFERENTIAL/PLATELET - Abnormal; Notable for the following components:      Result Value   RBC 4.07 (*)    MCV 105.2 (*)    All other components within normal limits  URINALYSIS, ROUTINE W REFLEX MICROSCOPIC - Abnormal; Notable for the following components:   APPearance CLOUDY (*)    Ketones, ur 5 (*)    Leukocytes,Ua SMALL (*)    Bacteria, UA FEW (*)    All other components within normal limits  COMPREHENSIVE METABOLIC PANEL  VALPROIC ACID LEVEL  PHENYTOIN LEVEL, TOTAL  EKG None  Radiology No results found.  Procedures Procedures (including critical care time)  Medications Ordered in ED Medications - No data to display   Initial Impression / Assessment and Plan / ED Course  I have reviewed the triage vital signs and the nursing notes.  Pertinent labs that were available during my care of the patient were reviewed by me and considered in my medical decision making (see chart for details).        He was alert and comfortable on exam with normal vital signs.  No focal areas of tenderness.  He occasionally said a few words to me.  When sister arrived, she reported that this appeared to be his baseline.  It does not sound like he had witnessed seizure activity or any falls.   His screening lab work is reassuring including normal CMP and CBC, UA without evidence of infection, reassuring Dilantin and Depakote levels.  Patient has remained alert and at baseline for several hours in the ED with normal vital signs.  His transient alteration of awareness does not sound like a TIA, he had no focal neurologic deficits aside from not answering questions during episode per sister.  I have recommended to sister and brother-in-law that he follow closely with his neurologist.  I have extensively reviewed return precautions and they voiced understanding. Final Clinical Impressions(s) / ED Diagnoses   Final diagnoses:  Transient alteration of awareness    ED Discharge Orders    None        Little, Wenda Overland, MD 12/10/18 [phone]

## 2018-12-10 NOTE — ED Notes (Signed)
Sister has been sitting with pt and she left for a min

## 2018-12-10 NOTE — Discharge Instructions (Addendum)
Follow up with your neurologist in the clinic.

## 2019-01-01 ENCOUNTER — Encounter: Payer: Self-pay | Admitting: Adult Health

## 2019-01-01 ENCOUNTER — Ambulatory Visit (INDEPENDENT_AMBULATORY_CARE_PROVIDER_SITE_OTHER): Payer: Medicare (Managed Care) | Admitting: Adult Health

## 2019-01-01 ENCOUNTER — Other Ambulatory Visit: Payer: Self-pay

## 2019-01-01 VITALS — BP 120/72 | HR 75 | Temp 97.5°F | Ht 70.0 in | Wt 150.0 lb

## 2019-01-01 DIAGNOSIS — G40909 Epilepsy, unspecified, not intractable, without status epilepticus: Secondary | ICD-10-CM

## 2019-01-01 NOTE — Patient Instructions (Signed)
Your Plan:  Continue Depakote and Dilantin  If your symptoms worsen or you develop new symptoms please let us know.   Thank you for coming to see [place] at [facility]. I hope we have been able to provide you high quality care today.  You may receive a patient satisfaction survey over the next few weeks. We would appreciate your feedback and comments so that we may continue to improve ourselves and the health of our patients.

## 2019-01-01 NOTE — Progress Notes (Signed)
PATIENT: Jeffery [name] DOB: Jun 14, 1955  REASON FOR VISIT: follow up HISTORY FROM: patient and brother-in-law  HISTORY OF PRESENT ILLNESS: Today 01/01/19:  Mr. Mellies is a 63 year old male with a history of mental retardation and seizures.  He returns today for follow-up.  He is currently taking Depakote 1500 mg daily and Dilantin 250 mg daily.  He is here today with his caregiver.  They deny any seizure events.  He did go to the emergency room in November for altered mental status but work-up was relatively unremarkable.  He lives with his sister and brother-in-law.  His brother-in-law reports that he is able to complete most ADLs independently.  They help him with his medication and food.  He returns today for evaluation.  HISTORY Interval history 06-27-2018,  Recent new seizure 04-16-2018 after patient forgot part of his night time medication.  See ED labs.  His facility is established a new control mechanism since, and it has worked thus far. He can't attend his classes during the corona crisis.  Refills today.    REVIEW OF SYSTEMS: Out of a complete 14 system review of symptoms, the patient complains only of the following symptoms, and all other reviewed systems are negative.  See HPI  ALLERGIES: No Known Allergies  HOME MEDICATIONS: Outpatient Medications Prior to Visit  Medication Sig Dispense Refill  . AVODART 0.5 MG capsule Take 0.5 mg by mouth every other day.     . divalproex (DEPAKOTE ER) 500 MG 24 hr tablet Take 3 tablets (1,500 mg total) by mouth daily. 90 tablet 4  . Multiple Vitamin (MULTIVITAMIN WITH MINERALS) TABS tablet Take 1 tablet by mouth at bedtime.    . phenytoin (DILANTIN) 100 MG ER capsule Take 2 capsules (200 mg total) by mouth at bedtime. 180 capsule 3  . phenytoin (DILANTIN) 50 MG tablet Chew 1 tablet (50 mg total) by mouth daily. 90 tablet 3  . pravastatin (PRAVACHOL) 40 MG tablet Take 1 tablet (40 mg total) by mouth at bedtime. 90 tablet 3   No  facility-administered medications prior to visit.    PAST MEDICAL HISTORY: Past Medical History:  Diagnosis Date  . Cancer ([place])    PROSTATE CANCER--PROSTATE BIOPSY PLANNED EVERY 6 MONTHS--NO TX PLANNED UNLESS THE CANCER STARTS PROGRESSING-  . Hypercholesteremia    under control  . 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 developed retardation symptoms about age 64,? whether drug related. Start having seizures. Hx. ETOH abuse in past. -"cognitive issues-can not live alone"  . Seizures ([place])    last seizure 10/27/13  grande maul    PAST SURGICAL HISTORY: Past Surgical History:  Procedure Laterality Date  . PROSTATE BIOPSY  03/27/2011   Procedure: BIOPSY TRANSRECTAL ULTRASONIC PROSTATE (TUBP);  Surgeon: Molli Hazard, MD;  Location: [facility];  Service: Urology;  Laterality: N/A;  Prostatic Block       . PROSTATE BIOPSY  09/27/2011   Procedure: BIOPSY TRANSRECTAL ULTRASONIC PROSTATE (TUBP);  Surgeon: Molli Hazard, MD;  Location: [facility];  Service: Urology;  Laterality: N/A;       . PROSTATE BIOPSY N/A 10/29/2013   Procedure: BIOPSY TRANSRECTAL ULTRASONIC PROSTATE (TUBP);  Surgeon: Alexis Frock, MD;  Location: [facility];  Service: Urology;  Laterality: N/A;    FAMILY HISTORY: Family History  Problem Relation Age of Onset  . Cancer Sister        breast  . Hypertension Sister   . Diabetes Sister   . Hypertension Sister  SOCIAL HISTORY: Social History   Socioeconomic History  . Marital status: Single    Spouse name: Not on file  . Number of children: 0  . Years of education: Not on file  . Highest education level: Not on file  Occupational History  . Occupation: Disability   Tobacco Use  . Smoking status: Former Smoker    Packs/day: 0.50    Years: 10.00    Pack years: 5.00    Types: Cigarettes  . Smokeless tobacco: Never Used  . Tobacco comment: SNEAKS A SMOKE EVERY NOW AND THEN  Substance and Sexual Activity  . Alcohol use: No    Comment:  none in 15 yrs,past ETOH abuse Sneaks liquor once every few months  . Drug use: No  . Sexual activity: Never  Other Topics Concern  . Not on file  Social History Narrative   Patient is single and lives with his sister and brother-in-law., [place]   Patient does not have any children.   Patient is right-handed.   Patient drinks two cups of coffee daily.   Social Determinants of Health   Financial Resource Strain:   . Difficulty of Paying Living Expenses: Not on file  Food Insecurity:   . Worried About Charity fundraiser in the Last Year: Not on file  . Ran Out of Food in the Last Year: Not on file  Transportation Needs:   . Lack of Transportation (Medical): Not on file  . Lack of Transportation (Non-Medical): Not on file  Physical Activity:   . Days of Exercise per Week: Not on file  . Minutes of Exercise per Session: Not on file  Stress:   . Feeling of Stress : Not on file  Social Connections:   . Frequency of Communication with Friends and Family: Not on file  . Frequency of Social Gatherings with Friends and Family: Not on file  . Attends Religious Services: Not on file  . Active Member of Clubs or Organizations: Not on file  . Attends Archivist Meetings: Not on file  . Marital Status: Not on file  Intimate Partner Violence:   . Fear of Current or Ex-Partner: Not on file  . Emotionally Abused: Not on file  . Physically Abused: Not on file  . Sexually Abused: Not on file      PHYSICAL EXAM  Vitals:   01/01/19 1352  BP: 120/72  Pulse: 75  Temp: (!) 97.5 F (36.4 C)  TempSrc: Oral  Weight: 150 lb (68 kg)  Height: 5\' 10"  (1.778 m)   Body mass index is 21.52 kg/m.  Generalized: Well developed, in no acute distress   Neurological examination  Mentation: Alert. Follows all commands  Cranial nerve II-XII: Pupils were equal round reactive to light. Extraocular movements were full, visual field were full on confrontational test. Head turning and shoulder  shrug  were normal and symmetric. Motor: The motor testing reveals 5 over 5 strength of all 4 extremities. Good symmetric motor tone is noted throughout.  Sensory: Sensory testing is intact to soft touch on all 4 extremities. No evidence of extinction is noted.  Coordination: Cerebellar testing reveals good finger-nose-finger and heel-to-shin bilaterally.   DIAGNOSTIC DATA (LABS, IMAGING, TESTING) - I reviewed patient records, labs, notes, testing and imaging myself where available.  Lab Results  Component Value Date   WBC 4.5 12/10/2018   HGB 13.4 12/10/2018   HCT 42.8 12/10/2018   MCV 105.2 (H) 12/10/2018   PLT  12/10/2018  PLATELET CLUMPS NOTED ON SMEAR, COUNT APPEARS ADEQUATE      Component Value Date/Time   NA 143 12/10/2018 1139   NA 141 06/27/2018 1550   K 4.4 12/10/2018 1139   CL 108 12/10/2018 1139   CO2 27 12/10/2018 1139   GLUCOSE 94 12/10/2018 1139   BUN 18 12/10/2018 1139   BUN 17 06/27/2018 1550   CREATININE 0.73 12/10/2018 1139   CREATININE 0.83 01/27/2016 1515   CALCIUM 9.4 12/10/2018 1139   PROT 6.8 12/10/2018 1139   PROT 6.6 06/27/2018 1550   ALBUMIN 3.7 12/10/2018 1139   ALBUMIN 4.0 06/27/2018 1550   AST 21 12/10/2018 1139   ALT 27 12/10/2018 1139   ALKPHOS 78 12/10/2018 1139   BILITOT 0.6 12/10/2018 1139   BILITOT 0.3 06/27/2018 1550   GFRNONAA >60 12/10/2018 1139   GFRNONAA >89 01/27/2016 1515   GFRAA >60 12/10/2018 1139   GFRAA >89 01/27/2016 1515   Lab Results  Component Value Date   CHOL 230 (H) 08/17/2011   HDL 80 08/17/2011   LDLCALC 137 (H) 08/17/2011   LDLDIRECT 101 04/26/2015   TRIG 66 08/17/2011   CHOLHDL 2.9 08/17/2011      ASSESSMENT AND PLAN 63 y.o. year old male  has a past medical history of Cancer ([place]), Hypercholesteremia, Mental retardation (03-20-11), and Seizures ([place]). here with:  1.  Seizures  -Continue Dilantin and Depakote -Blood levels recently checked in November-both in normal range  Advised the patient and  his brother-in-law that if the patient has any seizure events he should let us know.  He will follow-up in 6 months or sooner if needed.  I spent 15 minutes with the patient. 50% of this time was spent reviewing plan of care   Ward Givens, MSN, NP-C 01/01/2019, 1:41 PM [facility] [address], [place], [place] [address] [phone]

## 2019-04-15 ENCOUNTER — Other Ambulatory Visit: Payer: Self-pay | Admitting: Internal Medicine

## 2019-04-16 ENCOUNTER — Other Ambulatory Visit: Payer: Self-pay | Admitting: Internal Medicine

## 2019-04-16 DIAGNOSIS — Z7952 Long term (current) use of systemic steroids: Secondary | ICD-10-CM

## 2019-06-27 ENCOUNTER — Ambulatory Visit
Admission: RE | Admit: 2019-06-27 | Discharge: 2019-06-27 | Disposition: A | Discharge status: Home / Self Care | Payer: Medicare (Managed Care) | Source: Ambulatory Visit | Attending: Internal Medicine | Admitting: Internal Medicine

## 2019-06-27 ENCOUNTER — Other Ambulatory Visit: Payer: Self-pay

## 2019-06-27 DIAGNOSIS — Z7952 Long term (current) use of systemic steroids: Secondary | ICD-10-CM

## 2019-07-07 ENCOUNTER — Ambulatory Visit (INDEPENDENT_AMBULATORY_CARE_PROVIDER_SITE_OTHER): Payer: Medicare (Managed Care) | Admitting: Adult Health

## 2019-07-07 ENCOUNTER — Other Ambulatory Visit: Payer: Self-pay

## 2019-07-07 VITALS — BP 111/74 | HR 66 | Wt 145.0 lb

## 2019-07-07 DIAGNOSIS — G40909 Epilepsy, unspecified, not intractable, without status epilepticus: Secondary | ICD-10-CM | POA: Diagnosis not present

## 2019-07-07 NOTE — Patient Instructions (Signed)
Your Plan:  Continue dilantin and depakote If your symptoms worsen or you develop new symptoms please let us know.       Thank you for coming to see [place] at [facility]. I hope we have been able to provide you high quality care today.  You may receive a patient satisfaction survey over the next few weeks. We would appreciate your feedback and comments so that we may continue to improve ourselves and the health of our patients.

## 2019-07-07 NOTE — Progress Notes (Signed)
PATIENT: Jeffery [name] DOB: 11/24/1955  REASON FOR VISIT: follow up HISTORY FROM: patient  HISTORY OF PRESENT ILLNESS: Today 07/07/19:  Jeffery [name] is a 64 year old male with a history of mental retardation and seizures.  He returns today for follow-up.  He is here today with his sister.  He continues to live with her.  He remains on Depakote and Dilantin.  He is a [facility].  Denies any seizure events.  Returns today for an evaluation.  HISTORY 01/01/19:  Jeffery [name] is a 64 year old male with a history of mental retardation and seizures.  He returns today for follow-up.  He is currently taking Depakote 1500 mg daily and Dilantin 250 mg daily.  He is here today with his caregiver.  They deny any seizure events.  He did go to the emergency room in November for altered mental status but work-up was relatively unremarkable.  He lives with his sister and brother-in-law.  His brother-in-law reports that he is able to complete most ADLs independently.  They help him with his medication and food.  He returns today for evaluation  REVIEW OF SYSTEMS: Out of a complete 14 system review of symptoms, the patient complains only of the following symptoms, and all other reviewed systems are negative.  See HPI  ALLERGIES: No Known Allergies  HOME MEDICATIONS: Outpatient Medications Prior to Visit  Medication Sig Dispense Refill  . AVODART 0.5 MG capsule Take 0.5 mg by mouth every other day.     . divalproex (DEPAKOTE ER) 500 MG 24 hr tablet Take 3 tablets (1,500 mg total) by mouth daily. 90 tablet 4  . Multiple Vitamin (MULTIVITAMIN WITH MINERALS) TABS tablet Take 1 tablet by mouth at bedtime.    . phenytoin (DILANTIN) 100 MG ER capsule Take 2 capsules (200 mg total) by mouth at bedtime. 180 capsule 3  . phenytoin (DILANTIN) 50 MG tablet Chew 1 tablet (50 mg total) by mouth daily. 90 tablet 3  . pravastatin (PRAVACHOL) 40 MG tablet Take 1 tablet (40 mg total) by mouth at bedtime. 90  tablet 3   No facility-administered medications prior to visit.    PAST MEDICAL HISTORY: Past Medical History:  Diagnosis Date  . Cancer ([place])    PROSTATE CANCER--PROSTATE BIOPSY PLANNED EVERY 6 MONTHS--NO TX PLANNED UNLESS THE CANCER STARTS PROGRESSING-  . Hypercholesteremia    under control  . 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 developed retardation symptoms about age 2,? whether drug related. Start having seizures. Hx. ETOH abuse in past. -"cognitive issues-can not live alone"  . Seizures ([place])    last seizure 10/27/13  grande maul    PAST SURGICAL HISTORY: Past Surgical History:  Procedure Laterality Date  . PROSTATE BIOPSY  03/27/2011   Procedure: BIOPSY TRANSRECTAL ULTRASONIC PROSTATE (TUBP);  Surgeon: Molli Hazard, MD;  [facility];  Service: Urology;  Laterality: N/A;  Prostatic Block       . PROSTATE BIOPSY  09/27/2011   Procedure: BIOPSY TRANSRECTAL ULTRASONIC PROSTATE (TUBP);  Surgeon: Molli Hazard, MD;  [facility];  Service: Urology;  Laterality: N/A;       . PROSTATE BIOPSY N/A 10/29/2013   Procedure: BIOPSY TRANSRECTAL ULTRASONIC PROSTATE (TUBP);  Surgeon: Alexis Frock, MD;  Location[facility];  Service: Urology;  Laterality: N/A;    FAMILY HISTORY: Family History  Problem Relation Age of Onset  . Cancer Sister        breast  . Hypertension Sister   .  Diabetes Sister   . Hypertension Sister     SOCIAL HISTORY: Social History   Socioeconomic History  . Marital status: Single    Spouse name: Not on file  . Number of children: 0  . Years of education: Not on file  . Highest education level: Not on file  Occupational History  . Occupation: Disability   Tobacco Use  . Smoking status: Former Smoker    Packs/day: 0.50    Years: 10.00    Pack years: 5.00    Types: Cigarettes  . Smokeless tobacco: Never Used  . Tobacco comment: SNEAKS A SMOKE EVERY NOW AND THEN  Vaping Use  . Vaping Use: Never used    Substance and Sexual Activity  . Alcohol use: No    Comment: none in 15 yrs,past ETOH abuse Sneaks liquor once every few months  . Drug use: No  . Sexual activity: Never  Other Topics Concern  . Not on file  Social History Narrative   Patient is single and lives with his sister and brother-in-law., [place]   Patient does not have any children.   Patient is right-handed.   Patient drinks two cups of coffee daily.   Social Determinants of Health   Financial Resource Strain:   . Difficulty of Paying Living Expenses:   Food Insecurity:   . Worried About Charity fundraiser in the Last Year:   . Arboriculturist in the Last Year:   Transportation Needs:   . Film/video editor (Medical):   [name] Lack of Transportation (Non-Medical):   Physical Activity:   . Days of Exercise per Week:   . Minutes of Exercise per Session:   Stress:   . Feeling of Stress :   Social Connections:   . Frequency of Communication with Friends and Family:   . Frequency of Social Gatherings with Friends and Family:   . Attends Religious Services:   . Active Member of Clubs or Organizations:   . Attends Archivist Meetings:   [name] Marital Status:   Intimate Partner Violence:   . Fear of Current or Ex-Partner:   . Emotionally Abused:   [name] Physically Abused:   . Sexually Abused:       PHYSICAL EXAM  Vitals:   07/07/19 1323  BP: 111/74  Pulse: 66  Weight: 145 lb (65.8 kg)   Body mass index is 20.81 kg/m.  Generalized: Well developed, in no acute distress   Neurological examination  Mentation: Alert. Follows all commands.  Nonverbal Cranial nerve II-XII: Pupils were equal round reactive to light. Extraocular movements were full, visual field were full on confrontational test.. Head turning and shoulder shrug  were normal and symmetric. Motor: The motor testing reveals 5 over 5 strength of all 4 extremities. Good symmetric motor tone is noted throughout.  Sensory: Sensory testing is intact  to soft touch on all 4 extremities. No evidence of extinction is noted.  Coordination: Cerebellar testing reveals good finger-nose-finger and heel-to-shin bilaterally.  Gait and station: Gait is normal.  .   DIAGNOSTIC DATA (LABS, IMAGING, TESTING) - I reviewed patient records, labs, notes, testing and imaging myself where available.  Lab Results  Component Value Date   WBC 4.5 12/10/2018   HGB 13.4 12/10/2018   HCT 42.8 12/10/2018   MCV 105.2 (H) 12/10/2018   PLT  12/10/2018    PLATELET CLUMPS NOTED ON SMEAR, COUNT APPEARS ADEQUATE      Component Value Date/Time   NA 143  12/10/2018 1139   NA 141 06/27/2018 1550   K 4.4 12/10/2018 1139   CL 108 12/10/2018 1139   CO2 27 12/10/2018 1139   GLUCOSE 94 12/10/2018 1139   BUN 18 12/10/2018 1139   BUN 17 06/27/2018 1550   CREATININE 0.73 12/10/2018 1139   CREATININE 0.83 01/27/2016 1515   CALCIUM 9.4 12/10/2018 1139   PROT 6.8 12/10/2018 1139   PROT 6.6 06/27/2018 1550   ALBUMIN 3.7 12/10/2018 1139   ALBUMIN 4.0 06/27/2018 1550   AST 21 12/10/2018 1139   ALT 27 12/10/2018 1139   ALKPHOS 78 12/10/2018 1139   BILITOT 0.6 12/10/2018 1139   BILITOT 0.3 06/27/2018 1550   GFRNONAA >60 12/10/2018 1139   GFRNONAA >89 01/27/2016 1515   GFRAA >60 12/10/2018 1139   GFRAA >89 01/27/2016 1515   Lab Results  Component Value Date   CHOL 230 (H) 08/17/2011   HDL 80 08/17/2011   LDLCALC 137 (H) 08/17/2011   LDLDIRECT 101 04/26/2015   TRIG 66 08/17/2011   CHOLHDL 2.9 08/17/2011    ASSESSMENT AND PLAN 64 y.o. year old male  has a past medical history of Cancer ([place]), Hypercholesteremia, Mental retardation (03-20-11), and Seizures ([place]). here with:  Seizures   Continue Dilantin and Depakote  Recently had blood work in February which I reviewed  Advised if he has any seizure events she should let us know  Follow-up in 1 year or sooner if needed   I spent 20 minutes of face-to-face and non-face-to-face time with patient.  This  included previsit chart review, lab review, study review, order entry, electronic health record documentation, patient education.  Ward Givens, MSN, NP-C 07/07/2019, 1:39 PM [facility] [address], [place],  [address] [phone]

## 2019-11-25 ENCOUNTER — Inpatient Hospital Stay (HOSPITAL_COMMUNITY)
Admission: EM | Admit: 2019-11-25 | Discharge: 2019-11-28 | DRG: 689 | Disposition: A | Discharge status: Home Health | Payer: Medicare (Managed Care) | Attending: Internal Medicine | Admitting: Internal Medicine

## 2019-11-25 ENCOUNTER — Other Ambulatory Visit: Payer: Self-pay

## 2019-11-25 ENCOUNTER — Encounter (HOSPITAL_COMMUNITY): Payer: Self-pay | Admitting: Emergency Medicine

## 2019-11-25 DIAGNOSIS — G9341 Metabolic encephalopathy: Secondary | ICD-10-CM | POA: Diagnosis present

## 2019-11-25 DIAGNOSIS — R338 Other retention of urine: Secondary | ICD-10-CM | POA: Diagnosis present

## 2019-11-25 DIAGNOSIS — F79 Unspecified intellectual disabilities: Secondary | ICD-10-CM | POA: Diagnosis present

## 2019-11-25 DIAGNOSIS — E860 Dehydration: Secondary | ICD-10-CM | POA: Diagnosis present

## 2019-11-25 DIAGNOSIS — W3400XS Accidental discharge from unspecified firearms or gun, sequela: Secondary | ICD-10-CM

## 2019-11-25 DIAGNOSIS — E876 Hypokalemia: Secondary | ICD-10-CM

## 2019-11-25 DIAGNOSIS — E785 Hyperlipidemia, unspecified: Secondary | ICD-10-CM | POA: Diagnosis present

## 2019-11-25 DIAGNOSIS — Z79899 Other long term (current) drug therapy: Secondary | ICD-10-CM

## 2019-11-25 DIAGNOSIS — N390 Urinary tract infection, site not specified: Principal | ICD-10-CM | POA: Diagnosis present

## 2019-11-25 DIAGNOSIS — E870 Hyperosmolality and hypernatremia: Secondary | ICD-10-CM | POA: Diagnosis not present

## 2019-11-25 DIAGNOSIS — S0193XS Puncture wound without foreign body of unspecified part of head, sequela: Secondary | ICD-10-CM

## 2019-11-25 DIAGNOSIS — N179 Acute kidney failure, unspecified: Secondary | ICD-10-CM | POA: Diagnosis present

## 2019-11-25 DIAGNOSIS — R2681 Unsteadiness on feet: Secondary | ICD-10-CM | POA: Diagnosis present

## 2019-11-25 DIAGNOSIS — G40909 Epilepsy, unspecified, not intractable, without status epilepticus: Secondary | ICD-10-CM | POA: Diagnosis present

## 2019-11-25 DIAGNOSIS — Z7982 Long term (current) use of aspirin: Secondary | ICD-10-CM

## 2019-11-25 DIAGNOSIS — G809 Cerebral palsy, unspecified: Secondary | ICD-10-CM | POA: Diagnosis present

## 2019-11-25 DIAGNOSIS — Z20822 Contact with and (suspected) exposure to covid-19: Secondary | ICD-10-CM | POA: Diagnosis present

## 2019-11-25 DIAGNOSIS — C61 Malignant neoplasm of prostate: Secondary | ICD-10-CM | POA: Diagnosis present

## 2019-11-25 DIAGNOSIS — Z8249 Family history of ischemic heart disease and other diseases of the circulatory system: Secondary | ICD-10-CM

## 2019-11-25 DIAGNOSIS — R4182 Altered mental status, unspecified: Secondary | ICD-10-CM

## 2019-11-25 DIAGNOSIS — Z833 Family history of diabetes mellitus: Secondary | ICD-10-CM

## 2019-11-25 DIAGNOSIS — E878 Other disorders of electrolyte and fluid balance, not elsewhere classified: Secondary | ICD-10-CM | POA: Diagnosis present

## 2019-11-25 DIAGNOSIS — Z803 Family history of malignant neoplasm of breast: Secondary | ICD-10-CM

## 2019-11-25 NOTE — ED Triage Notes (Signed)
Pt to ED with his sister who states pt started acting altered earlier tonight.  Also with staggering gait.  Pts sister sts pt takes Dilantin and Depakote and the last time he was acting this way they had to cut his dosage.  Pt will not answer any questions in Triage

## 2019-11-26 ENCOUNTER — Emergency Department (HOSPITAL_COMMUNITY): Payer: Medicare (Managed Care)

## 2019-11-26 DIAGNOSIS — R4182 Altered mental status, unspecified: Secondary | ICD-10-CM | POA: Diagnosis present

## 2019-11-26 LAB — CBC WITH DIFFERENTIAL/PLATELET
Abs Immature Granulocytes: 0.02 10*3/uL (ref 0.00–0.07)
Basophils Absolute: 0 10*3/uL (ref 0.0–0.1)
Basophils Relative: 0 %
Eosinophils Absolute: 0.1 10*3/uL (ref 0.0–0.5)
Eosinophils Relative: 2 %
HCT: 39.3 % (ref 39.0–52.0)
Hemoglobin: 12.7 g/dL — ABNORMAL LOW (ref 13.0–17.0)
Immature Granulocytes: 0 %
Lymphocytes Relative: 37 %
Lymphs Abs: 2.6 10*3/uL (ref 0.7–4.0)
MCH: 31.8 pg (ref 26.0–34.0)
MCHC: 32.3 g/dL (ref 30.0–36.0)
MCV: 98.3 fL (ref 80.0–100.0)
Monocytes Absolute: 0.9 10*3/uL (ref 0.1–1.0)
Monocytes Relative: 13 %
Neutro Abs: 3.3 10*3/uL (ref 1.7–7.7)
Neutrophils Relative %: 48 %
Platelets: 133 10*3/uL — ABNORMAL LOW (ref 150–400)
RBC: 4 MIL/uL — ABNORMAL LOW (ref 4.22–5.81)
RDW: 12.3 % (ref 11.5–15.5)
WBC: 6.9 10*3/uL (ref 4.0–10.5)
nRBC: 0 % (ref 0.0–0.2)

## 2019-11-26 LAB — BASIC METABOLIC PANEL
Anion gap: 13 (ref 5–15)
BUN: 10 mg/dL (ref 8–23)
CO2: 27 mmol/L (ref 22–32)
Calcium: 9 mg/dL (ref 8.9–10.3)
Chloride: 104 mmol/L (ref 98–111)
Creatinine, Ser: 1.04 mg/dL (ref 0.61–1.24)
GFR, Estimated: >60 mL/min (ref >60)
Glucose, Bld: 85 mg/dL (ref 70–99)
Potassium: 2.9 mmol/L — ABNORMAL LOW (ref 3.5–5.1)
Sodium: 144 mmol/L (ref 135–145)

## 2019-11-26 LAB — URINALYSIS, ROUTINE W REFLEX MICROSCOPIC
Bilirubin Urine: NEGATIVE
Glucose, UA: NEGATIVE mg/dL
Ketones, ur: NEGATIVE mg/dL
Nitrite: NEGATIVE
Protein, ur: 30 mg/dL — AB
Specific Gravity, Urine: 1.009 (ref 1.005–1.030)
WBC, UA: >50 WBC/hpf — ABNORMAL HIGH (ref 0–5)
pH: 6 (ref 5.0–8.0)

## 2019-11-26 LAB — COMPREHENSIVE METABOLIC PANEL
ALT: 22 U/L (ref 0–44)
AST: 33 U/L (ref 15–41)
Albumin: 3.5 g/dL (ref 3.5–5.0)
Alkaline Phosphatase: 74 U/L (ref 38–126)
Anion gap: 16 — ABNORMAL HIGH (ref 5–15)
BUN: 10 mg/dL (ref 8–23)
CO2: 23 mmol/L (ref 22–32)
Calcium: 8.8 mg/dL — ABNORMAL LOW (ref 8.9–10.3)
Chloride: 102 mmol/L (ref 98–111)
Creatinine, Ser: 0.89 mg/dL (ref 0.61–1.24)
GFR, Estimated: >60 mL/min (ref >60)
Glucose, Bld: 94 mg/dL (ref 70–99)
Potassium: 2.7 mmol/L — CL (ref 3.5–5.1)
Sodium: 141 mmol/L (ref 135–145)
Total Bilirubin: 0.6 mg/dL (ref 0.3–1.2)
Total Protein: 6.8 g/dL (ref 6.5–8.1)

## 2019-11-26 LAB — PHENYTOIN LEVEL, TOTAL: Phenytoin Lvl: 10.7 ug/mL (ref 10.0–20.0)

## 2019-11-26 LAB — VALPROIC ACID LEVEL: Valproic Acid Lvl: 70 ug/mL (ref 50.0–100.0)

## 2019-11-26 LAB — RESPIRATORY PANEL BY RT PCR (FLU A&B, COVID)
Influenza A by PCR: NEGATIVE
Influenza B by PCR: NEGATIVE
SARS Coronavirus 2 by RT PCR: NEGATIVE

## 2019-11-26 LAB — HIV ANTIBODY (ROUTINE TESTING W REFLEX): HIV Screen 4th Generation wRfx: NONREACTIVE

## 2019-11-26 LAB — MAGNESIUM: Magnesium: 1.5 mg/dL — ABNORMAL LOW (ref 1.7–2.4)

## 2019-11-26 MED ORDER — POTASSIUM CHLORIDE CRYS ER 20 MEQ PO TBCR
40.0000 meq | EXTENDED_RELEASE_TABLET | ORAL | Status: AC
  Administered 2019-11-26 (×2): 40 meq via ORAL
  Filled 2019-11-26 (×2): qty 2

## 2019-11-26 MED ORDER — ASPIRIN EC 81 MG PO TBEC
81.0000 mg | DELAYED_RELEASE_TABLET | Freq: Every day | ORAL | Status: DC
  Administered 2019-11-26 – 2019-11-28 (×3): 81 mg via ORAL
  Filled 2019-11-26 (×3): qty 1

## 2019-11-26 MED ORDER — DUTASTERIDE 0.5 MG PO CAPS
0.5000 mg | ORAL_CAPSULE | Freq: Every day | ORAL | Status: DC
  Administered 2019-11-26 – 2019-11-28 (×3): 0.5 mg via ORAL
  Filled 2019-11-26 (×4): qty 1

## 2019-11-26 MED ORDER — ENOXAPARIN SODIUM 40 MG/0.4ML ~~LOC~~ SOLN
40.0000 mg | SUBCUTANEOUS | Status: DC
  Administered 2019-11-26 – 2019-11-28 (×3): 40 mg via SUBCUTANEOUS
  Filled 2019-11-26 (×3): qty 0.4

## 2019-11-26 MED ORDER — MAGNESIUM SULFATE 2 GM/50ML IV SOLN
2.0000 g | Freq: Once | INTRAVENOUS | Status: AC
  Administered 2019-11-26: 2 g via INTRAVENOUS
  Filled 2019-11-26: qty 50

## 2019-11-26 MED ORDER — SODIUM CHLORIDE 0.9 % IV SOLN
2.0000 g | INTRAVENOUS | Status: DC
  Administered 2019-11-27 – 2019-11-28 (×2): 2 g via INTRAVENOUS
  Filled 2019-11-26 (×3): qty 20

## 2019-11-26 MED ORDER — ATORVASTATIN CALCIUM 10 MG PO TABS
20.0000 mg | ORAL_TABLET | Freq: Every day | ORAL | Status: DC
  Administered 2019-11-26 – 2019-11-28 (×3): 20 mg via ORAL
  Filled 2019-11-26 (×3): qty 2

## 2019-11-26 MED ORDER — POLYETHYLENE GLYCOL 3350 17 G PO PACK: 17.0000 g | PACK | Freq: Every day | ORAL | Status: DC | PRN

## 2019-11-26 MED ORDER — ONDANSETRON HCL 4 MG/2ML IJ SOLN: 4.0000 mg | Freq: Four times a day (QID) | INTRAMUSCULAR | Status: DC | PRN

## 2019-11-26 MED ORDER — POTASSIUM CHLORIDE 10 MEQ/100ML IV SOLN
10.0000 meq | INTRAVENOUS | Status: AC
  Administered 2019-11-26 (×2): 10 meq via INTRAVENOUS
  Filled 2019-11-26 (×2): qty 100

## 2019-11-26 MED ORDER — PHENYTOIN SODIUM EXTENDED 100 MG PO CAPS
200.0000 mg | ORAL_CAPSULE | Freq: Every day | ORAL | Status: DC
  Administered 2019-11-27: 200 mg via ORAL
  Filled 2019-11-26: qty 2

## 2019-11-26 MED ORDER — DIVALPROEX SODIUM ER 500 MG PO TB24
1500.0000 mg | ORAL_TABLET | Freq: Every day | ORAL | Status: DC
  Administered 2019-11-27 – 2019-11-28 (×2): 1500 mg via ORAL
  Filled 2019-11-26 (×2): qty 3

## 2019-11-26 MED ORDER — PHENYTOIN 50 MG PO CHEW: 50.0000 mg | CHEWABLE_TABLET | Freq: Every day | ORAL | Status: DC

## 2019-11-26 MED ORDER — ONDANSETRON HCL 4 MG PO TABS: 4.0000 mg | ORAL_TABLET | Freq: Four times a day (QID) | ORAL | Status: DC | PRN

## 2019-11-26 MED ORDER — SODIUM CHLORIDE 0.9 % IV SOLN
2.0000 g | Freq: Once | INTRAVENOUS | Status: AC
  Administered 2019-11-26: 2 g via INTRAVENOUS
  Filled 2019-11-26: qty 20

## 2019-11-26 MED ORDER — POTASSIUM CHLORIDE CRYS ER 20 MEQ PO TBCR
40.0000 meq | EXTENDED_RELEASE_TABLET | Freq: Once | ORAL | Status: AC
  Administered 2019-11-26: 40 meq via ORAL
  Filled 2019-11-26: qty 2

## 2019-11-26 MED ORDER — ACETAMINOPHEN 650 MG RE SUPP: 650.0000 mg | RECTAL | Status: DC | PRN

## 2019-11-26 MED ORDER — SODIUM CHLORIDE 0.9 % IV SOLN: INTRAVENOUS | Status: AC

## 2019-11-26 MED ORDER — ACETAMINOPHEN 325 MG PO TABS
650.0000 mg | ORAL_TABLET | ORAL | Status: DC | PRN
  Administered 2019-11-26: 650 mg via ORAL
  Filled 2019-11-26: qty 2

## 2019-11-26 NOTE — ED Notes (Signed)
Dinner tray delivered.

## 2019-11-26 NOTE — Hospital Course (Signed)
Knows AAOx2 to able to carry on conversation 7:30  - 8pm. Was in his room and folding his pajamas when he was noted to be more agitated and no I already put them on. Losing balance.   [facility] who   Hes been in hospital before for altered mental status. Prior admission for   Hearing aid. Can't spell can't write.   Dysuria.   At baseline uses walker and cane. Lives with sisters. Seizures.
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
## 2019-11-26 NOTE — Discharge Planning (Signed)
[facility] SW called regarding pt status.  RNCM relayed that plan is for admission.

## 2019-11-26 NOTE — ED Notes (Signed)
[name] [phone] sister

## 2019-11-26 NOTE — ED Notes (Signed)
Condom cath placed on patient

## 2019-11-26 NOTE — ED Provider Notes (Signed)
[facility] EMERGENCY DEPARTMENT Provider Note   CSN: [id number] Arrival date & time: 11/25/19  2321   History Chief Complaint  Patient presents with  . Altered Mental Status    Jeffery [name] is a 64 y.o. male.  The history is provided by a relative. The history is limited by the condition of the patient (Mental retardation, altered mental status).  Altered Mental Status He has history of cerebral palsy with mental retardation, seizure disorder, hyperlipidemia and is brought in by his sister because of altered mental status.  He was in his normal mental state yesterday which means that he was able to communicate and follow commands.  Today, he was not following commands and was not speaking.  He seemed to be hallucinating and that he seemed to be responding to people who were not there.  He has not had any known fever and there has been no cough and no vomiting or diarrhea.  He has had no known sick contacts.  He is also noted to be unsteady on his feet.  In the past, balance difficulty like this was related to elevated phenytoin level.  Hallucinations have been related to urinary tract infections.  He has been vaccinated against COVID-19.  Past Medical History:  Diagnosis Date  . Cancer ([place])    PROSTATE CANCER--PROSTATE BIOPSY PLANNED EVERY 6 MONTHS--NO TX PLANNED UNLESS THE CANCER STARTS PROGRESSING-  . Hypercholesteremia    under control  . 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 developed retardation symptoms about age 45,? whether drug related. Start having seizures. Hx. ETOH abuse in past. -"cognitive issues-can not live alone"  . Seizures ([place])    last seizure 10/27/13  [place]    Patient Active Problem List   Diagnosis Date Noted  . Cerebral palsy ([place]) 06/27/2018  . Seizure disorder ([place]) 06/19/2017  . Acute cystitis without hematuria   . Altered mental status 12/12/2015  . Metabolic encephalopathy 25/85/2778  . Congestive heart failure with  left ventricular diastolic dysfunction ([place]) 05/28/2015  . Prostate cancer ([place]) 04/07/2015  . Profound intellectual disabilities 11/15/2012  . Encounter for therapeutic drug monitoring 11/15/2012  . Routine general medical examination at a health care facility 10/02/2012  . Muscle spasm of back 09/19/2011  . Hearing difficulty 08/17/2011  . Hypercholesterolemia 08/17/2011  . Mild intellectual disability 01/30/2008  . Convulsions ([place]) 01/30/2008  . ELEVATED PROSTATE SPECIFIC ANTIGEN 01/30/2008    Past Surgical History:  Procedure Laterality Date  . PROSTATE BIOPSY  03/27/2011   Procedure: BIOPSY TRANSRECTAL ULTRASONIC PROSTATE (TUBP);  Surgeon: Molli Hazard, MD;  Location: [facility];  Service: Urology;  Laterality: N/A;  Prostatic Block       . PROSTATE BIOPSY  09/27/2011   Procedure: BIOPSY TRANSRECTAL ULTRASONIC PROSTATE (TUBP);  Surgeon: Molli Hazard, MD;  Location: [facility];  Service: Urology;  Laterality: N/A;       . PROSTATE BIOPSY N/A 10/29/2013   Procedure: BIOPSY TRANSRECTAL ULTRASONIC PROSTATE (TUBP);  Surgeon: Alexis Frock, MD;  Location: [facility];  Service: Urology;  Laterality: N/A;       Family History  Problem Relation Age of Onset  . Cancer Sister        breast  . Hypertension Sister   . Diabetes Sister   . Hypertension Sister     Social History   Tobacco Use  . Smoking status: Former Smoker    Packs/day: 0.50    Years: 10.00    Pack years: 5.00  Types: Cigarettes  . Smokeless tobacco: Never Used  . Tobacco comment: SNEAKS A SMOKE EVERY NOW AND THEN  Vaping Use  . Vaping Use: Never used  Substance Use Topics  . Alcohol use: No    Comment: none in 15 yrs,past ETOH abuse Sneaks liquor once every few months  . Drug use: No    Home Medications Prior to Admission medications   Medication Sig Start Date End Date Taking? Authorizing Provider  AVODART 0.5 MG capsule Take 0.5 mg by mouth every other day.     [provider]    divalproex (DEPAKOTE ER) 500 MG 24 hr tablet Take 3 tablets (1,500 mg total) by mouth daily. 06/27/18   Dohmeier, Asencion Partridge, MD  Multiple Vitamin (MULTIVITAMIN WITH MINERALS) TABS tablet Take 1 tablet by mouth at bedtime.    [provider]  phenytoin (DILANTIN) 100 MG ER capsule Take 2 capsules (200 mg total) by mouth at bedtime. 06/27/18   Dohmeier, Asencion Partridge, MD  phenytoin (DILANTIN) 50 MG tablet Chew 1 tablet (50 mg total) by mouth daily. 06/27/18   Dohmeier, Asencion Partridge, MD  pravastatin (PRAVACHOL) 40 MG tablet Take 1 tablet (40 mg total) by mouth at bedtime. 05/12/16   Vivi Barrack, MD    Allergies    Patient has no known allergies.  Review of Systems   Review of Systems  Unable to perform ROS: Mental status change    Physical Exam Updated Vital Signs BP (!) 152/108 (BP Location: Left Arm)   Pulse (!) 109   Resp 18   Wt 59 kg   SpO2 100%   BMI 18.65 kg/m   Physical Exam Vitals and nursing note reviewed.   64 year old male, resting comfortably and in no acute distress. Vital signs are significant for elevated blood pressure. Oxygen saturation is 100%, which is normal. Head is normocephalic and atraumatic. PERRLA, EOMI. Oropharynx is clear. Neck is nontender and supple without adenopathy or JVD. Back is nontender and there is no CVA tenderness. Lungs are clear without rales, wheezes, or rhonchi. Chest is nontender. Heart has regular rate and rhythm without murmur. Abdomen is soft, flat, nontender without masses or hepatosplenomegaly and peristalsis is normoactive. Extremities have no cyanosis or edema, full range of motion is present. Skin is warm and dry without rash. Neurologic: Awake but noncommunicative and will not follow commands, cranial nerves are intact, moves all extremities equally.   ED Results / Procedures / Treatments   Labs (all labs ordered are listed, but only abnormal results are displayed) Labs Reviewed  COMPREHENSIVE METABOLIC PANEL - Abnormal;  Notable for the following components:      Result Value   Potassium 2.7 (*)    Calcium 8.8 (*)    Anion gap 16 (*)    All other components within normal limits  CBC WITH DIFFERENTIAL/PLATELET - Abnormal; Notable for the following components:   RBC 4.00 (*)    Hemoglobin 12.7 (*)    Platelets 133 (*)    All other components within normal limits  URINALYSIS, ROUTINE W REFLEX MICROSCOPIC - Abnormal; Notable for the following components:   APPearance CLOUDY (*)    Hgb urine dipstick SMALL (*)    Protein, ur 30 (*)    Leukocytes,Ua LARGE (*)    WBC, UA >50 (*)    Bacteria, UA FEW (*)    All other components within normal limits  URINE CULTURE  RESPIRATORY PANEL BY RT PCR (FLU A&B, COVID)  VALPROIC ACID LEVEL  PHENYTOIN LEVEL,  TOTAL  MAGNESIUM  BASIC METABOLIC PANEL   Radiology DG Chest Port 1 View  Result Date: 11/26/2019 CLINICAL DATA:  Altered mental status EXAM: PORTABLE CHEST 1 VIEW COMPARISON:  03/02/2016 FINDINGS: The heart size and mediastinal contours are within normal limits. Both lungs are clear. The visualized skeletal structures are unremarkable. IMPRESSION: No active disease. Electronically Signed   By: Constance Holster M.D.   On: 11/26/2019 01:31    Procedures Procedures   Medications Ordered in ED Medications  potassium chloride 10 mEq in 100 mL IVPB (0 mEq Intravenous Stopped 11/26/19 0457)  potassium chloride SA (KLOR-CON) CR tablet 40 mEq (40 mEq Oral Given 11/26/19 0221)  cefTRIAXone (ROCEPHIN) 2 g in sodium chloride 0.9 % 100 mL IVPB (0 g Intravenous Stopped 11/26/19 0709)    ED Course  I have reviewed the triage vital signs and the nursing notes.  Pertinent labs & imaging results that were available during my care of the patient were reviewed by me and considered in my medical decision making (see chart for details).  MDM Rules/Calculators/A&P Altered mental status, possible occult infection.  Will check urinalysis and chest x-ray.  Also check  electrolytes, renal function, hepatic function.  Report of incoordination, will check phenytoin level and will also check valproic acid level.  Old records are reviewed, and he does have a prior hospitalization for altered mental status secondary to urinary tract infection, also ED visits for elevated phenytoin levels.  Chest x-ray shows no evidence of pneumonia.  Labs show mild anemia, moderate to severe hypokalemia, borderline thrombocytopenia.  He is given oral and intravenous potassium.  Urinalysis has come back showing evidence of infection and this is likely the cause of his altered mentation.  Both phenytoin and valproate levels were in the therapeutic range.  Case is discussed with Dr. Marva Panda of internal medicine teaching service who agrees to admit the patient.  Final Clinical Impression(s) / ED Diagnoses Final diagnoses:  Urinary tract infection without hematuria, site unspecified  Altered mental status, unspecified altered mental status type  Hypokalemia    Rx / DC Orders ED Discharge Orders    None       Delora Fuel, MD 50/38/88 [phone]

## 2019-11-27 ENCOUNTER — Inpatient Hospital Stay (HOSPITAL_COMMUNITY): Payer: Medicare (Managed Care)

## 2019-11-27 DIAGNOSIS — F79 Unspecified intellectual disabilities: Secondary | ICD-10-CM | POA: Diagnosis present

## 2019-11-27 DIAGNOSIS — N390 Urinary tract infection, site not specified: Principal | ICD-10-CM

## 2019-11-27 DIAGNOSIS — S0193XS Puncture wound without foreign body of unspecified part of head, sequela: Secondary | ICD-10-CM | POA: Diagnosis not present

## 2019-11-27 DIAGNOSIS — Z8249 Family history of ischemic heart disease and other diseases of the circulatory system: Secondary | ICD-10-CM | POA: Diagnosis not present

## 2019-11-27 DIAGNOSIS — E860 Dehydration: Secondary | ICD-10-CM | POA: Diagnosis present

## 2019-11-27 DIAGNOSIS — E870 Hyperosmolality and hypernatremia: Secondary | ICD-10-CM | POA: Diagnosis not present

## 2019-11-27 DIAGNOSIS — W3400XS Accidental discharge from unspecified firearms or gun, sequela: Secondary | ICD-10-CM | POA: Diagnosis not present

## 2019-11-27 DIAGNOSIS — R338 Other retention of urine: Secondary | ICD-10-CM | POA: Diagnosis present

## 2019-11-27 DIAGNOSIS — G809 Cerebral palsy, unspecified: Secondary | ICD-10-CM | POA: Diagnosis present

## 2019-11-27 DIAGNOSIS — R2681 Unsteadiness on feet: Secondary | ICD-10-CM | POA: Diagnosis present

## 2019-11-27 DIAGNOSIS — E878 Other disorders of electrolyte and fluid balance, not elsewhere classified: Secondary | ICD-10-CM | POA: Diagnosis present

## 2019-11-27 DIAGNOSIS — Z20822 Contact with and (suspected) exposure to covid-19: Secondary | ICD-10-CM | POA: Diagnosis present

## 2019-11-27 DIAGNOSIS — Z7982 Long term (current) use of aspirin: Secondary | ICD-10-CM | POA: Diagnosis not present

## 2019-11-27 DIAGNOSIS — E876 Hypokalemia: Secondary | ICD-10-CM | POA: Diagnosis present

## 2019-11-27 DIAGNOSIS — E785 Hyperlipidemia, unspecified: Secondary | ICD-10-CM | POA: Diagnosis present

## 2019-11-27 DIAGNOSIS — Z833 Family history of diabetes mellitus: Secondary | ICD-10-CM | POA: Diagnosis not present

## 2019-11-27 DIAGNOSIS — C61 Malignant neoplasm of prostate: Secondary | ICD-10-CM | POA: Diagnosis present

## 2019-11-27 DIAGNOSIS — G9341 Metabolic encephalopathy: Secondary | ICD-10-CM | POA: Diagnosis present

## 2019-11-27 DIAGNOSIS — G40909 Epilepsy, unspecified, not intractable, without status epilepticus: Secondary | ICD-10-CM | POA: Diagnosis present

## 2019-11-27 DIAGNOSIS — Z803 Family history of malignant neoplasm of breast: Secondary | ICD-10-CM | POA: Diagnosis not present

## 2019-11-27 DIAGNOSIS — N179 Acute kidney failure, unspecified: Secondary | ICD-10-CM | POA: Diagnosis present

## 2019-11-27 DIAGNOSIS — Z79899 Other long term (current) drug therapy: Secondary | ICD-10-CM | POA: Diagnosis not present

## 2019-11-27 LAB — BASIC METABOLIC PANEL
Anion gap: 14 (ref 5–15)
Anion gap: 12 (ref 5–15)
Anion gap: 9 (ref 5–15)
BUN: 17 mg/dL (ref 8–23)
BUN: 15 mg/dL (ref 8–23)
BUN: 14 mg/dL (ref 8–23)
CO2: 25 mmol/L (ref 22–32)
CO2: 27 mmol/L (ref 22–32)
CO2: 27 mmol/L (ref 22–32)
Calcium: 9.7 mg/dL (ref 8.9–10.3)
Calcium: 9.3 mg/dL (ref 8.9–10.3)
Calcium: 9.1 mg/dL (ref 8.9–10.3)
Chloride: 115 mmol/L — ABNORMAL HIGH (ref 98–111)
Chloride: 110 mmol/L (ref 98–111)
Chloride: 106 mmol/L (ref 98–111)
Creatinine, Ser: 1.35 mg/dL — ABNORMAL HIGH (ref 0.61–1.24)
Creatinine, Ser: 1.14 mg/dL (ref 0.61–1.24)
Creatinine, Ser: 1.25 mg/dL — ABNORMAL HIGH (ref 0.61–1.24)
GFR, Estimated: 58 mL/min — ABNORMAL LOW (ref >60)
GFR, Estimated: >60 mL/min (ref >60)
GFR, Estimated: >60 mL/min (ref >60)
Glucose, Bld: 98 mg/dL (ref 70–99)
Glucose, Bld: 104 mg/dL — ABNORMAL HIGH (ref 70–99)
Glucose, Bld: 102 mg/dL — ABNORMAL HIGH (ref 70–99)
Potassium: 4 mmol/L (ref 3.5–5.1)
Potassium: 3.6 mmol/L (ref 3.5–5.1)
Potassium: 3.3 mmol/L — ABNORMAL LOW (ref 3.5–5.1)
Sodium: 154 mmol/L — ABNORMAL HIGH (ref 135–145)
Sodium: 149 mmol/L — ABNORMAL HIGH (ref 135–145)
Sodium: 142 mmol/L (ref 135–145)

## 2019-11-27 LAB — CBC WITH DIFFERENTIAL/PLATELET
Abs Immature Granulocytes: 0.01 10*3/uL (ref 0.00–0.07)
Basophils Absolute: 0 10*3/uL (ref 0.0–0.1)
Basophils Relative: 0 %
Eosinophils Absolute: 0.1 10*3/uL (ref 0.0–0.5)
Eosinophils Relative: 2 %
HCT: 43 % (ref 39.0–52.0)
Hemoglobin: 13.8 g/dL (ref 13.0–17.0)
Immature Granulocytes: 0 %
Lymphocytes Relative: 20 %
Lymphs Abs: 1.5 10*3/uL (ref 0.7–4.0)
MCH: 31.4 pg (ref 26.0–34.0)
MCHC: 32.1 g/dL (ref 30.0–36.0)
MCV: 97.9 fL (ref 80.0–100.0)
Monocytes Absolute: 1 10*3/uL (ref 0.1–1.0)
Monocytes Relative: 13 %
Neutro Abs: 4.9 10*3/uL (ref 1.7–7.7)
Neutrophils Relative %: 65 %
Platelets: 199 10*3/uL (ref 150–400)
RBC: 4.39 MIL/uL (ref 4.22–5.81)
RDW: 12.5 % (ref 11.5–15.5)
WBC: 7.6 10*3/uL (ref 4.0–10.5)
nRBC: 0 % (ref 0.0–0.2)

## 2019-11-27 LAB — URINE CULTURE

## 2019-11-27 LAB — MAGNESIUM: Magnesium: 2.1 mg/dL (ref 1.7–2.4)

## 2019-11-27 MED ORDER — POTASSIUM CHLORIDE 20 MEQ PO PACK
40.0000 meq | PACK | Freq: Once | ORAL | Status: AC
  Administered 2019-11-27: 40 meq via ORAL
  Filled 2019-11-27: qty 2

## 2019-11-27 MED ORDER — LACTATED RINGERS IV SOLN: INTRAVENOUS | Status: DC

## 2019-11-27 MED ORDER — DEXTROSE 5 % IV SOLN: INTRAVENOUS | Status: DC

## 2019-11-27 MED ORDER — MAGNESIUM SULFATE 2 GM/50ML IV SOLN: 2.0000 g | Freq: Once | INTRAVENOUS | Status: DC

## 2019-11-27 NOTE — ED Notes (Signed)
Dr Truman Hayward returned page. Explained concern that pt is continues  leaning to right side with AMS. This findings was documented yesterday and on primary complaint from sister. No  CT scan was ordered during visit. No new orders received at this time.
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
## 2019-11-27 NOTE — ED Notes (Signed)
Internal medicine residents paged

## 2019-11-27 NOTE — ED Notes (Signed)
Lunch Tray Ordered @ 1034. 

## 2019-11-27 NOTE — Progress Notes (Signed)
PT has been admitted to the unit via hospital bed. Pt has all belongings and all equipment ahs been sent with the pt. Pt denies and shows no signs of pain. Telephone and Call are within reach.    11/27/19 1637  Vitals  Temp 98.2 F (36.8 C)  Temp Source Oral  BP (!) 138/96  MAP (mmHg) 111  BP Location Left Arm  BP Method Automatic  Patient Position (if appropriate) Lying  Pulse Rate 74  Pulse Rate Source Dinamap  Resp 17  Level of Consciousness  Level of Consciousness Alert  MEWS COLOR  MEWS Score Color Green  Oxygen Therapy  SpO2 99 %  O2 Device Room Air  MEWS Score  MEWS Temp 0  MEWS Systolic 0  MEWS Pulse 0  MEWS RR 0  MEWS LOC 0  MEWS Score 0

## 2019-11-27 NOTE — ED Notes (Signed)
Pt needs assistance with meals which was provided
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
## 2019-11-28 LAB — BASIC METABOLIC PANEL
Anion gap: 10 (ref 5–15)
BUN: 13 mg/dL (ref 8–23)
CO2: 26 mmol/L (ref 22–32)
Calcium: 9.2 mg/dL (ref 8.9–10.3)
Chloride: 108 mmol/L (ref 98–111)
Creatinine, Ser: 1.17 mg/dL (ref 0.61–1.24)
GFR, Estimated: >60 mL/min (ref >60)
Glucose, Bld: 121 mg/dL — ABNORMAL HIGH (ref 70–99)
Potassium: 3.4 mmol/L — ABNORMAL LOW (ref 3.5–5.1)
Sodium: 144 mmol/L (ref 135–145)

## 2019-11-28 LAB — CBC
HCT: 42.8 % (ref 39.0–52.0)
Hemoglobin: 14 g/dL (ref 13.0–17.0)
MCH: 31.7 pg (ref 26.0–34.0)
MCHC: 32.7 g/dL (ref 30.0–36.0)
MCV: 97.1 fL (ref 80.0–100.0)
Platelets: 197 10*3/uL (ref 150–400)
RBC: 4.41 MIL/uL (ref 4.22–5.81)
RDW: 12.5 % (ref 11.5–15.5)
WBC: 10.1 10*3/uL (ref 4.0–10.5)
nRBC: 0 % (ref 0.0–0.2)

## 2019-11-28 LAB — URINE CULTURE: Culture: NO GROWTH

## 2019-11-28 MED ORDER — POTASSIUM CHLORIDE 20 MEQ PO PACK
40.0000 meq | PACK | Freq: Once | ORAL | Status: AC
  Administered 2019-11-28: 40 meq via ORAL
  Filled 2019-11-28: qty 2

## 2019-11-28 MED ORDER — RAMELTEON 8 MG PO TABS
8.0000 mg | ORAL_TABLET | Freq: Every day | ORAL | Status: DC
  Administered 2019-11-28: 8 mg via ORAL
  Filled 2019-11-28 (×2): qty 1

## 2019-11-28 MED ORDER — CEFDINIR 300 MG PO CAPS: 300.0000 mg | ORAL_CAPSULE | Freq: Two times a day (BID) | ORAL | 0 refills | Status: AC

## 2019-11-28 NOTE — Progress Notes (Signed)
Pt bladder scan 771cc. V.O. per Dr. Coy Saunas "ok to I&O cath pt now".

## 2019-11-28 NOTE — TOC Transition Note (Signed)
Transition of Care [facility]) - CM/SW Discharge Note   Patient Details  Name: Jeffery [name] MRN: [id number] Date of Birth: 08-30-1955  [facility] CM/SW Contact:  Pollie Friar, RN Phone Number: 11/28/2019, 3:35 PM   Clinical Narrative:    Pt live with sister and is active with [facility]. CM has reached out to [facility] with recommendations for [facility] services and a wheelchair. Elmyra Ricks with [facility] has arranged for a wheelchair to be delivered to the room for the family to use. She is also arranging [place] services. CM has updated the patients sister: Jeffery [name].  Jeffery [name] will provide transport home.   Final next level of care: Home w Home Health Services Barriers to Discharge: No Barriers Identified   Patient Goals and CMS Choice   CMS Medicare.gov Compare Post Acute Care list provided to:: Patient Represenative (must comment) Choice offered to / list presented to : Sibling  Discharge Placement                       Discharge Plan and Services                DME Arranged: Wheelchair manual       Representative spoke with at DME Agency: Elmyra Ricks with [facility] Arranged: PT, OT       Representative spoke with at [place]: Elmyra Ricks with [facility]  Social Determinants of Health ([place]) Interventions     Readmission Risk Interventions No flowsheet data found.

## 2019-11-28 NOTE — Evaluation (Addendum)
Physical Therapy Evaluation Patient Details Name: Jeffery [name] MRN: [id number] DOB: 10-09-1955 Today's Date: 11/28/2019   History of Present Illness  Pt is a 64 y/o male admitted secondary to AMS likely from UTI vs urinary retention. PMH includes prostate cancer and intellectual disability.   Clinical Impression  Pt admitted secondary to problem above with deficits below. Per pt's sister, pt still altered as pt tends to answer "yes" to everything and was not able to state his sister's name. Pt very unsteady with uncoordinated steps requiring mod A +2 and HHA. Attempted to use RW, however, pt with difficulty sequencing. Pt currently active with [facility] and recommend max HH services to be initiated with [facility] upon return home. Per sister, they can assist pt with mobility. Feel he will require WC at this time to increase safety with mobility. Will continue to follow acutely.     Follow Up Recommendations Supervision/Assistance - 24 hour ([facility] with max HH services initiated)    Equipment Recommendations  Wheelchair cushion (measurements PT);Wheelchair (measurements PT)    Recommendations for Other Services       Precautions / Restrictions Precautions Precautions: Fall Restrictions Weight Bearing Restrictions: No      Mobility  Bed Mobility Overal bed mobility: Needs Assistance Bed Mobility: Sit to Supine;Supine to Sit     Supine to sit: Mod assist Sit to supine: Supervision   General bed mobility comments: Mod A for trunk assist and assist to scoot hips forward. Increased time required. Supervision to return to supine.     Transfers Overall transfer level: Needs assistance Equipment used: 2 person hand held assist Transfers: Sit to/from Stand Sit to Stand: Mod assist;Max assist;+2 physical assistance         General transfer comment: Mod A +2 to stand from higher bed height. Max A +2 to stand from toilet.   Ambulation/Gait Ambulation/Gait assistance: Mod assist;+2  physical assistance Gait Distance (Feet): 40 Feet Assistive device: 2 person hand held assist;Rolling walker (2 wheeled) Gait Pattern/deviations: Step-through pattern;Decreased stride length;Narrow base of support Gait velocity: Decreased   General Gait Details: Pt very unsteady with uncoordinated steps. Attempted to use RW, however, pt with difficulty sequencing. Continued to require mod A +2.   Stairs            Wheelchair Mobility    Modified Rankin (Stroke Patients Only)       Balance Overall balance assessment: Needs assistance Sitting-balance support: No upper extremity supported;Feet supported Sitting balance-Leahy Scale: Fair     Standing balance support: Bilateral upper extremity supported;During functional activity Standing balance-Leahy Scale: Poor Standing balance comment: Reliant on UE and external support                              Pertinent Vitals/Pain Pain Assessment: Faces Faces Pain Scale: No hurt    Home Living Family/patient expects to be discharged to:: Private residence Living Arrangements: Other relatives (sister) Available Help at Discharge: Family;Available 24 hours/day Type of Home: House Home Access: Stairs to enter Entrance Stairs-Rails: Right Entrance Stairs-Number of Steps: 4 Home Layout: Laundry or work area in basement;Able to live on main level with bedroom/bathroom Home Equipment: None      Prior Function Level of Independence: Independent         Comments: Active with [facility] on MWF. Pt's sister reports pt was very independent with mobility and ADLs.      Hand Dominance  Extremity/Trunk Assessment   Upper Extremity Assessment Upper Extremity Assessment: Defer to OT evaluation    Lower Extremity Assessment Lower Extremity Assessment: RLE deficits/detail;LLE deficits/detail;Generalized weakness RLE Deficits / Details: Poor coordination when taking steps.  RLE Coordination: decreased gross  motor;decreased fine motor LLE Deficits / Details: Poor coordination when taking steps.  LLE Coordination: decreased fine motor;decreased gross motor       Communication   Communication: HOH (wears hearing aides)  Cognition Arousal/Alertness: Awake/alert Behavior During Therapy: WFL for tasks assessed/performed Overall Cognitive Status: Difficult to assess                                 General Comments: Per pt's sister, pt is just responding yes to everything. Was unable to state his sister's name and he normally can. Pt does not have his hearing aides, however, so unsure if that is causing more deficits.       General Comments General comments (skin integrity, edema, etc.): Pt's sister present during session     Exercises     Assessment/Plan    PT Assessment Patient needs continued PT services  PT Problem List Decreased strength;Decreased range of motion;Decreased activity tolerance;Decreased mobility;Decreased balance;Decreased knowledge of use of DME;Decreased knowledge of precautions;Pain       PT Treatment Interventions DME instruction;Gait training;Therapeutic activities;Functional mobility training;Balance training;Therapeutic exercise;Patient/family education;Stair training    PT Goals (Current goals can be found in the Care Plan section)  Acute Rehab PT Goals Patient Stated Goal: to go home per sister PT Goal Formulation: With family Time For Goal Achievement: 12/12/19 Potential to Achieve Goals: Good    Frequency Min 3X/week   Barriers to discharge        Co-evaluation PT/OT/SLP Co-Evaluation/Treatment: Yes Reason for Co-Treatment: Complexity of the patient's impairments (multi-system involvement);To address functional/ADL transfers;Necessary to address cognition/behavior during functional activity PT goals addressed during session: Mobility/safety with mobility;Balance         AM-PAC PT "6 Clicks" Mobility  Outcome Measure Help needed  turning from your back to your side while in a flat bed without using bedrails?: A Little Help needed moving from lying on your back to sitting on the side of a flat bed without using bedrails?: A Lot Help needed moving to and from a bed to a chair (including a wheelchair)?: A Lot Help needed standing up from a chair using your arms (e.g., wheelchair or bedside chair)?: A Lot Help needed to walk in hospital room?: A Lot Help needed climbing 3-5 steps with a railing? : Total 6 Click Score: 12    End of Session   Activity Tolerance: Patient tolerated treatment well Patient left: in bed;with call bell/phone within reach;with family/visitor present;with bed alarm set Nurse Communication: Mobility status PT Visit Diagnosis: Unsteadiness on feet (R26.81);Muscle weakness (generalized) (M62.81);Difficulty in walking, not elsewhere classified (R26.2)    Time: 6226-3335 PT Time Calculation (min) (ACUTE ONLY): 35 min   Charges:   PT Evaluation $PT Eval Moderate Complexity: 1 Mod          Reuel Derby, PT, DPT  Acute Rehabilitation Services  Pager: [phone] Office: [phone]   Rudean Hitt 11/28/2019, 2:42 PM

## 2019-11-28 NOTE — Evaluation (Signed)
Occupational Therapy Evaluation Patient Details Name: Jeffery [name] MRN: [id number] DOB: 21-Aug-1955 Today's Date: 11/28/2019    History of Present Illness Pt is a 64 y/o male admitted secondary to AMS likely from UTI vs urinary retention. PMH includes prostate cancer and intellectual disability.    Clinical Impression   PT admitted with acute encephalopathy. Pt currently with functional limitiations due to the deficits listed below (see OT problem list). Pt currently with balance deficits total +2 (A) for basic transfers and decreased ability to sequence adls. Pt normally performs task mod I. Pt is even able to be home for brief periods at home alone for quick errand prior to this admission. Pt now requires 24/7 (A) due to fall risk and need to have someone help with toilet needs.  Pt will benefit from skilled OT to increase their independence and safety with adls and balance to allow discharge hhot via [facility] .     Follow Up Recommendations  Home health OT    Equipment Recommendations  Wheelchair (measurements OT);Wheelchair cushion (measurements OT)    Recommendations for Other Services       Precautions / Restrictions Precautions Precautions: Fall Restrictions Weight Bearing Restrictions: No      Mobility Bed Mobility Overal bed mobility: Needs Assistance Bed Mobility: Sit to Supine;Supine to Sit     Supine to sit: Mod assist Sit to supine: Supervision   General bed mobility comments: Mod A for trunk assist and assist to scoot hips forward. Increased time required. Supervision to return to supine.     Transfers Overall transfer level: Needs assistance Equipment used: 2 person hand held assist Transfers: Sit to/from Stand Sit to Stand: Mod assist;Max assist;+2 physical assistance         General transfer comment: Mod A +2 to stand from higher bed height. Max A +2 to stand from toilet.     Balance Overall balance assessment: Needs assistance Sitting-balance  support: No upper extremity supported;Feet supported Sitting balance-Leahy Scale: Fair     Standing balance support: Bilateral upper extremity supported;During functional activity Standing balance-Leahy Scale: Poor Standing balance comment: Reliant on UE and external support                            ADL either performed or assessed with clinical judgement   ADL Overall ADL's : Needs assistance/impaired                     Lower Body Dressing: Maximal assistance;Sitting/lateral leans Lower Body Dressing Details (indicate cue type and reason): pt required backward chaining and max cues to initiate Toilet Transfer: +2 for physical assistance;Maximal assistance Toilet Transfer Details (indicate cue type and reason): pt does not make request to void but rather observation of smear on bed linen and facilitation of transfer by therapist help pt meet need. sister educated on need for bowel and bladder schedule every hour to 1.5 hr at home Toileting- Water quality scientist and Hygiene: Total assistance         General ADL Comments: pt shows visual awareness to recognizing sister but does not state name when asked. sister reports normally he would know information. sister calling [facility] for additional help at the end of fsession     Vision Patient Visual Report: No change from baseline       Perception     Praxis      Pertinent Vitals/Pain Pain Assessment: No/denies pain Faces Pain Scale: No hurt  Hand Dominance Right   Extremity/Trunk Assessment Upper Extremity Assessment Upper Extremity Assessment: RUE deficits/detail RUE Deficits / Details: decreased coordination WFL for shoulder wrist and elbow movment   Lower Extremity Assessment Lower Extremity Assessment: RLE deficits/detail RLE Deficits / Details: Poor coordination when taking steps.  RLE Coordination: decreased gross motor;decreased fine motor LLE Deficits / Details: Poor coordination when taking  steps.  LLE Coordination: decreased fine motor;decreased gross motor       Communication Communication Communication: HOH (wears hearing aides)   Cognition Arousal/Alertness: Awake/alert Behavior During Therapy: WFL for tasks assessed/performed Overall Cognitive Status: Difficult to assess                                 General Comments: Per pt's sister, pt is just responding yes to everything. Was unable to state his sister's name and he normally can. Pt does not have his hearing aides, however, so unsure if that is causing more deficits.    General Comments  sister present during entire session    Exercises     Shoulder Instructions      Home Living Family/patient expects to be discharged to:: Private residence Living Arrangements: Other relatives (sister) Available Help at Discharge: Family;Available 24 hours/day Type of Home: House Home Access: Stairs to enter [employer] of Steps: 4 Entrance Stairs-Rails: Right Home Layout: Laundry or work area in basement;Able to live on main level with bedroom/bathroom     Bathroom Shower/Tub: Teacher, early years/pre: Standard     Home Equipment: None   Additional Comments: pt does not read or write at baseline per sister. pt does attend [facility] MWF via transport and enjoys it. Pt loves to dance and listen to [employer] music      Prior Functioning/Environment Level of Independence: Independent        Comments: Active with [facility] on MWF. Pt's sister reports pt was very independent with mobility and ADLs.         OT Problem List: Decreased strength;Decreased activity tolerance;Impaired balance (sitting and/or standing);Decreased safety awareness;Decreased knowledge of use of DME or AE;Decreased knowledge of precautions      OT Treatment/Interventions: Self-care/ADL training    OT Goals(Current goals can be found in the care plan section) Acute Rehab OT Goals Patient Stated Goal: to go home  per sister OT Goal Formulation: With patient/family Time For Goal Achievement: 12/12/19 Potential to Achieve Goals: Good  OT Frequency: Min 2X/week   Barriers to D/C:            Co-evaluation PT/OT/SLP Co-Evaluation/Treatment: Yes Reason for Co-Treatment: Complexity of the patient's impairments (multi-system involvement);For patient/therapist safety;To address functional/ADL transfers PT goals addressed during session: Mobility/safety with mobility;Balance OT goals addressed during session: ADL's and self-care;Proper use of Adaptive equipment and DME;Strengthening/ROM      AM-PAC OT "6 Clicks" Daily Activity     Outcome Measure Help from another person eating meals?: A Lot Help from another person taking care of personal grooming?: A Lot Help from another person toileting, which includes using toliet, bedpan, or urinal?: A Lot Help from another person bathing (including washing, rinsing, drying)?: A Lot Help from another person to put on and taking off regular upper body clothing?: A Lot Help from another person to put on and taking off regular lower body clothing?: A Lot 6 Click Score: 12   End of Session Equipment Utilized During Treatment: Gait belt;Rolling walker Nurse Communication: Mobility  status;Precautions  Activity Tolerance: Patient tolerated treatment well Patient left: in bed;with call bell/phone within reach;with bed alarm set;with family/visitor present  OT Visit Diagnosis: Unsteadiness on feet (R26.81)                Time: 8250-5397 OT Time Calculation (min): 35 min Charges:  OT General Charges $OT Visit: 1 Visit OT Evaluation $OT Eval Moderate Complexity: 1 Mod   Brynn, OTR/L  Acute Rehabilitation Services Pager: [phone] Office: [phone] .   Jeri Modena 11/28/2019, 3:25 PM

## 2019-11-28 NOTE — Discharge Summary (Addendum)
Name: Jeffery [name] MRN: [id number] DOB: 11/15/1955 64 y.o. PCP: Gifford Shave, MD  Date of Admission: 11/25/2019 11:39 PM Date of Discharge: 11/28/2019 Attending Physician: Aldine Contes, MD  Discharge Diagnosis: 1.  Acute encephalopathy 2.  Urinary retention 3.  Urinary tract infection 4.  History of prostate adenocarcinoma  Discharge Medications: Allergies as of 11/28/2019   No Known Allergies     Medication List    STOP taking these medications   pravastatin 40 MG tablet Commonly known as: PRAVACHOL     TAKE these medications   aspirin EC 81 MG tablet Take 81 mg by mouth daily. Swallow whole.   atorvastatin 20 MG tablet Commonly known as: LIPITOR Take 20 mg by mouth daily.   Avodart 0.5 MG capsule Generic drug: dutasteride Take 0.5 mg by mouth at bedtime.   cefdinir 300 MG capsule Commonly known as: OMNICEF Take 1 capsule (300 mg total) by mouth 2 (two) times daily for 3 days.   cholecalciferol 25 MCG (1000 UNIT) tablet Commonly known as: VITAMIN D3 Take 1,000 Units by mouth daily.   citalopram 20 MG tablet Commonly known as: CELEXA Take 20 mg by mouth daily.   divalproex 500 MG 24 hr tablet Commonly known as: DEPAKOTE ER Take 3 tablets (1,500 mg total) by mouth daily.   multivitamin with minerals Tabs tablet Take 1 tablet by mouth at bedtime.   phenytoin 100 MG ER capsule Commonly known as: Dilantin Take 2 capsules (200 mg total) by mouth at bedtime.   phenytoin 50 MG tablet Commonly known as: DILANTIN Chew 1 tablet (50 mg total) by mouth daily. What changed: when to take this            Durable Medical Equipment  (From admission, onward)         Start     Ordered   11/28/19 1409  For home use only DME standard manual wheelchair with seat cushion  Once       Comments: Patient suffers from encephalopathy and intellectual disability which impairs their ability to perform daily activities like bathing in the home.  A walker will  not resolve issue with performing activities of daily living. A wheelchair will allow patient to safely perform daily activities. Patient can safely propel the wheelchair in the home or has a caregiver who can provide assistance. Length of need 3 months.  Accessories: elevating leg rests (ELRs), wheel locks, extensions and anti-tippers.   11/28/19 1411          Disposition and follow-up:   [name] was discharged from [facility] in Stable condition.  At the hospital follow up visit please address:  1.   Urology Cefdinir 300mg  twice daily for 3 days following discharge.  Follow-up on prostate adenocarcinoma Evaluate for urinary retention  Neurology Follow-up for seizure disorder  2.  Labs / imaging needed at time of follow-up: N/A  3.  Pending labs/ test needing follow-up: N/A  Follow-up Appointments:  Follow-up Information    Gifford Shave, MD Follow up.   Specialty: Family Medicine Contact information: [address] [place] [address] [phone]               Urology Neurology Primary care physician  Hospital Course by problem list: 1.  Acute encephalopathy  Urinary Tract Infection Urinary retention Patient presents to the ED for acute encephalopathy.  Baseline AAOx3 and he is able tocommunicate andfollow directions. Noted to have difficulty following directions and conversing on admission. UA with +  leukos and bacteuria. Chart review shows episode of similar symptoms due to UTI in 2017 due to [place].Low suspicion for seizure given normal phenytoin and valproate levels.CT head was performed and showed stable atrophy with no acute infarct or hemorrhage. His AMS is likely due to UTI and urinary retention.  Bladder scan showed 771 cc and I/O cath collected 900 cc of urine.  The urinary retention is likely due to enlarged prostate from his prostate cancer.  Patient finished 2 days of ceftriaxone.  Urine culture came back  negative.  Patient's mental status significantly improved on discharge.  PT/OT recommended a wheelchair to use at home and [facility] will come and evaluate patient for possible therapy and home health.    Foley was placed for urinary retention. Cefdinir prescribed on discharge to take for 3 more days to complete 5 day course of antibiotics. Advised patient to follow-up with his urologist for the urinary retention and history of prostate adenocarcinoma.  Also follow-up with neurologist as well as primary care as soon as possible.  Hypernatremia Sodium on admission was 144 and increased to 154.  Chloride also elevated to 115 with elevated creatinine of 1.35.  This is likely due to dehydration secondary to poor p.o. intake.  D5W was started at 125 cc/h and resulted improvement of sodium to 144.   Discharge Vitals:   BP 113/80 (BP Location: Left Arm)   Pulse 69   Temp (!) 97.5 F (36.4 C) (Axillary)   Resp 18   Ht 6' (1.829 m)   Wt 61.2 kg   SpO2 98%   BMI 18.31 kg/m   Pertinent Labs, Studies, and Procedures:  CBC Latest Ref Rng & Units 11/28/2019 11/27/2019 11/26/2019  WBC 4.0 - 10.5 K/uL 10.1 7.6 6.9  Hemoglobin 13.0 - 17.0 g/dL 14.0 13.8 12.7(L)  Hematocrit 39 - 52 % 42.8 43.0 39.3  Platelets 150 - 400 K/uL 197 199 133(L)   CMP Latest Ref Rng & Units 11/28/2019 11/27/2019 11/27/2019  Glucose 70 - 99 mg/dL 121(H) 102(H) 104(H)  BUN 8 - 23 mg/dL 13 14 15   Creatinine 0.61 - 1.24 mg/dL 1.17 1.25(H) 1.14  Sodium 135 - 145 mmol/L 144 142 149(H)  Potassium 3.5 - 5.1 mmol/L 3.4(L) 3.3(L) 3.6  Chloride 98 - 111 mmol/L 108 106 110  CO2 22 - 32 mmol/L 26 27 27   Calcium 8.9 - 10.3 mg/dL 9.2 9.1 9.3  Total Protein 6.5 - 8.1 g/dL - - -  Total Bilirubin 0.3 - 1.2 mg/dL - - -  Alkaline Phos 38 - 126 U/L - - -  AST 15 - 41 U/L - - -  ALT 0 - 44 U/L - - -   DG Chest Port 1 View  Result Date: 11/26/2019 CLINICAL DATA:  Altered mental status EXAM: PORTABLE CHEST 1 VIEW COMPARISON:  03/02/2016  FINDINGS: The heart size and mediastinal contours are within normal limits. Both lungs are clear. The visualized skeletal structures are unremarkable. IMPRESSION: No active disease. Electronically Signed   By: Constance Holster M.D.   On: 11/26/2019 01:31   Head CT  IMPRESSION: Stable areas of atrophy. Probable basilar perforator small vessel disease in the pons. Elsewhere brain parenchyma appears unremarkable. No findings felt to represent acute infarct. No mass or hemorrhage.  Mild arterial vascular calcification noted. Mucosal thickening in lateral left sphenoid sinus region. Probable cerumen in each external auditory canal.  Discharge Instructions: Discharge Instructions    Call MD for:  persistant nausea and vomiting   Complete  by: As directed    Call MD for:  severe uncontrolled pain   Complete by: As directed    Diet - low sodium heart healthy   Complete by: As directed    Discharge instructions   Complete by: As directed    [name] came into the hospital for altered mental status.  This is likely due to urinary retention from his enlarged prostate.  Patient has history of prostate cancer.  We will put in a Foley catheter.  -Please follow-up with his urologist as soon as possible to evaluate for his prostate cancer and urinary retention -Please follow-up with his neurologist for seizure disorder -Please follow-up with his primary care doctor -Please contact [facility] to set up physical therapy and home health  Take care   Increase activity slowly   Complete by: As directed       Signed: Harvie Heck, MD 11/28/2019, 5:44 PM   Pager: [phone]
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
## 2020-03-18 ENCOUNTER — Telehealth: Payer: Self-pay

## 2020-03-18 NOTE — Telephone Encounter (Signed)
I connected by phone with Jeffery [name] and/or patient's caregiver on 03/18/2020 at 4:57 PM to discuss the potential vaccination through our Homebound vaccination initiative.   Prevaccination Checklist for COVID-19 Vaccines  1.  Are you feeling sick today? no  2.  Have you ever received a dose of a COVID-19 [name]?  yes      If yes, which one? Moderna   How many dose of Covid-19 [name] have your received and dates ? 2, 02/26/2019, 03/26/2019   Check all that apply: I live in a long-term care setting. no  I have been diagnosed with a medical condition(s). Please list: CP, CHF, Seizure disorder (pertinent to homebound status)  I am a first responder. no  I work in a long-term care facility, correctional facility, hospital, restaurant, retail setting, school, or other setting with high exposure to the public. no  4. Do you have a health condition or are you undergoing treatment that makes you moderately or severely immunocompromised? (This would include treatment for cancer or HIV, receipt of organ transplant, immunosuppressive therapy or high-dose corticosteroids, CAR-T-cell therapy, hematopoietic cell transplant [HCT], DiGeorge syndrome or Wiskott-Aldrich syndrome)  no  5. Have you received hematopoietic cell transplant (HCT) or CAR-T-cell therapies since receiving COVID-19 [name]? no  6.  Have you ever had an allergic reaction: (This would include a severe reaction [ e.g., anaphylaxis] that required treatment with epinephrine or EpiPen or that caused you to go to the hospital.  It would also include an allergic reaction that occurred within 4 hours that caused hives, swelling, or respiratory distress, including wheezing.) A.  A previous dose of COVID-19 [name]. no  B.  A [name] or injectable therapy that contains multiple components, one of which is a COVID-19 [name] component, but it is not known which component elicited the immediate reaction. no  C.  Are you allergic to polyethylene  glycol? no  D. Are you allergic to Polysorbate, which is found in some vaccines, film coated tablets and intravenous steroids?  no   7.  Have you ever had an allergic reaction to another [name] (other than COVID-19 [name]) or an injectable medication? (This would include a severe reaction [ e.g., anaphylaxis] that required treatment with epinephrine or EpiPen or that caused you to go to the hospital.  It would also include an allergic reaction that occurred within 4 hours that caused hives, swelling, or respiratory distress, including wheezing.)  no   8.  Have you ever had a severe allergic reaction (e.g., anaphylaxis) to something other than a component of the COVID-19 [name], or any [name] or injectable medication?  This would include food, pet, venom, environmental, or oral medication allergies.  no   Check all that apply to you:  Am a male between ages 20 and 79 years old  no  Women 39 through 67 years of age can receive any FDA-authorized or -approved COVID-19 [name]. However, they should be informed of the rare but increased risk of thrombosis with thrombocytopenia syndrome (TTS) after receipt of the Jeffery [name] and the availability of other FDA-authorized and -approved COVID-19 vaccines. People who had TTS after a first dose of Jeffery [name] [name] should not receive a subsequent dose of Jeffery [name] product    Am a male between ages 16 and 65 years old  no Males 5 through 67 years of age may receive the correct formulation of Pfizer-BioNTech COVID-19 [name]. Males 18 and older can receive any FDA-authorized or -approved [name]. However, people receiving an mRNA COVID-19 [name],  especially males 68 through 67 years of age and their parents/legal representative (when relevant), should be informed of the risk of developing myocarditis (an inflammation of the heart muscle) or pericarditis (inflammation of the lining around the heart) after receipt of an mRNA [name]. The risk of  developing either myocarditis or pericarditis after vaccination is low, and lower than the risk of myocarditis associated with SARS-CoV-2 infection in adolescents and adults. [name] recipients should be counseled about the need to seek care if symptoms of myocarditis or pericarditis develop after vaccination     Have a history of myocarditis or pericarditis  no Myocarditis or pericarditis after receipt of the first dose of an mRNA COVID-19 [name] series but before administration of the second dose  Experts advise that people who develop myocarditis or pericarditis after a dose of an mRNA COVID-19 [name] not receive a subsequent dose of any COVID-19 [name], until additional safety data are available.  Administration of a subsequent dose of COVID-19 [name] before safety data are available can be considered in certain circumstances after the episode of myocarditis or pericarditis has completely resolved. Until additional data are available, some experts recommend a Jeffery [name] COVID-19 [name] be considered instead of an mRNA COVID-19 [name]. Decisions about proceeding with a subsequent dose should include a conversation between the patient, their parent/legal representative (when relevant), and their clinical team, which may include a cardiologist.    Have been treated with monoclonal antibodies or convalescent serum to prevent or treat COVID-19  no Vaccination should be offered to people regardless of history of prior symptomatic or asymptomatic SARS-CoV-2 infection. There is no recommended minimal interval between infection and vaccination.  However, vaccination should be deferred if a patient received monoclonal antibodies or convalescent serum as treatment for COVID-19 or for post-exposure prophylaxis. This is a precautionary measure until additional information becomes available, to avoid interference of the antibody treatment with [name]-induced immune responses.  Defer COVID-19 vaccination for 30  days when a passive antibody product was used for post-exposure prophylaxis.  Defer COVID-19 vaccination for 90 days when a passive antibody product was used to treat COVID-19.     Diagnosed with Multisystem Inflammatory Syndrome (MIS-C or MIS-A) after a COVID-19 infection  no It is unknown if people with a history of MIS-C or MIS-A are at risk for a dysregulated immune response to COVID-19 vaccination.  People with a history of MIS-C or MIS-A may choose to be vaccinated. Considerations for vaccination may include:   Clinical recovery from MIS-C or MIS-A, including return to normal cardiac function   Personal risk of severe acute COVID-19 (e.g., age, underlying conditions)   High or substantial community transmission of SARS-CoV-2 and personal increased risk of reinfection.   Timing of any immunomodulatory therapies (general best practice guidelines for immunization can be consulted for more information [email or web address])   It has been 90 days or more since their diagnosis of MIS-C   Onset of MIS-C occurred before any COVID-19 vaccination   A conversation between the patient, their guardian(s), and their clinical team or a specialist may assist with COVID-19 vaccination decisions. Healthcare providers and health departments may also request a consultation from the [facility] at [email or web address].     Have a bleeding disorder  no Take a blood thinner  no As with all vaccines, any COVID-19 [name] product may be given to these patients, if a physician familiar with the patient's bleeding risk determines that the [name] can be administered intramuscularly with reasonable safety.  ACIP recommends the following technique for intramuscular vaccination in patients with bleeding disorders or taking blood thinners: a fine-gauge needle (23-gauge or smaller caliber)  should be used for the vaccination, followed by firm pressure on the site, without rubbing, for at least 2 minutes.  People who regularly take aspirin or anticoagulants as part of their routine medications do not need to stop these medications prior to receipt of any COVID-19 [name].    Have a history of heparin-induced thrombocytopenia (HIT)  no Although the etiology of TTS associated with the Jeffery [name] COVID-19 [name] is unclear, it appears to be similar to another rare immune-mediated syndrome, heparin-induced thrombocytopenia (HIT). People with a history of an episode of an immune-mediated syndrome characterized by thrombosis and thrombocytopenia, such as HIT, should be offered a currently FDA-approved or FDA-authorized mRNA COVID-19 [name] if it has been ?90 days since their TTS resolved. After 90 days, patients may be vaccinated with any currently FDA-approved or FDA-authorized COVID-19 [name], including Jeffery [name] COVID-19 [name]. However, people who developed TTS after their initial Jeffery [name] [name] should not receive a Jeffery [name] booster dose.  Experts believe the following factors do not make people more susceptible to TTS after receipt of the Jeffery [name]. People with these conditions can be vaccinated with any FDA-authorized or - approved COVID-19 [name], including the Jeffery [name]:   A prior history of venous thromboembolism   Risk factors for venous thromboembolism (e.g., inherited or acquired thrombophilia including Factor V Leiden; prothrombin gene 20210A mutation; antiphospholipid syndrome; protein C, protein S or antithrombin deficiency   A prior history of other types of thromboses not associated with thrombocytopenia   Pregnancy, post-partum status, or receipt of hormonal contraceptives (e.g., combined oral contraceptives, patch, ring)   Additional recipient education materials can be found at [name].     Am currently pregnant or breastfeeding  no Vaccination is recommended for all people aged 85 years and older, including people that are:   Pregnant   Breastfeeding   Trying to get pregnant now or who might become pregnant in the future   Pregnant, breastfeeding, and post-partum people 109 through 67 years of age should be aware of the rare risk of TTS after receipt of the Jeffery [name] COVID-19 [name] and the availability of other FDA-authorized or -approved COVID-19 vaccines (i.e., mRNA vaccines).    Have received dermal fillers  no FDA-authorized or -approved COVID-19 vaccines can be administered to people who have received injectable dermal fillers who have no contraindications for vaccination.  Infrequently, these people might experience temporary swelling at or near the site of filler injection (usually the face or lips) following administration of a dose of an mRNA COVID-19 [name]. These people should be advised to contact their healthcare provider if swelling develops at or near the site of dermal filler following vaccination.     Have a history of Guillain-Barr Syndrome (GBS)  no People with a history of GBS can receive any FDA-authorized or -approved COVID-19 [name]. However, given the possible association between the Jeffery [name] and an increased risk of GBS, a patient with a history of GBS and their clinical team should discuss the availability of mRNA vaccines to offer protection against COVID-19. The highest risk has been observed in men aged 73-64 years with symptoms of GBS beginning within 42 days after Jeffery [name] COVID-19 vaccination.  People who had GBS after receiving Jeffery [name] [name] should be made aware of the option to receive an mRNA COVID-19 [name] booster at least 2 months (8 weeks) after  the Jeffery [name] dose. However, Jeffery [name] [name] may be used as a booster, particularly if GBS occurred more than 42 days after vaccination or was related to a non-[name] factor. Prior to  booster vaccination, a conversation between the patient and their clinical team may assist with decisions about use of a COVID-19 booster dose, including the timing of administration     Postvaccination Observation Times for People without Contraindications to Covid 19 Vaccination.  30 minutes:  People with a history of: A contraindication to another type of COVID-19 [name] product (i.e., mRNA or viral vector COVID-19 vaccines)   Immediate (within 4 hours of exposure) non-severe allergic reaction to a COVID-19 [name] or injectable therapies   Anaphylaxis due to any cause   Immediate allergic reaction of any severity to a non-COVID-19 [name]   15 minutes: All other people  This patient is a 65 y.o. male that meets the FDA criteria to receive homebound vaccination. Patient or parent/caregiver understands they have the option to accept or refuse homebound vaccination.  Patient passed the pre-screening checklist and would like to proceed with homebound vaccination.  Based on questionnaire above, I recommend the patient be observed for 15 minutes.  There are an estimated #0 other household members/caregivers who are also interested in receiving the [name].    The patient has been confirmed homebound and eligible for homebound vaccination with the considerations outlined above. I will send the patient's information to our scheduling team who will reach out to schedule the patient and potential caregiver/family members for homebound vaccination.    Dan Humphreys 03/18/2020 4:57 PM

## 2020-03-25 ENCOUNTER — Ambulatory Visit: Payer: Medicare (Managed Care) | Attending: Critical Care Medicine

## 2020-03-25 DIAGNOSIS — Z23 Encounter for immunization: Secondary | ICD-10-CM

## 2020-03-25 NOTE — Progress Notes (Signed)
   Covid-19 Vaccination Clinic  Name:  Jeffery [name]    MRN: [id number] DOB: 10-Jan-1956  03/25/2020  [name] was observed post Covid-19 immunization for 15 minutes without incident. He was provided with Vaccine Information Sheet and instruction to access the V-Safe system.   [name] was instructed to call 911 with any severe reactions post vaccine: [name] Difficulty breathing  . Swelling of face and throat  . A fast heartbeat  . A bad rash all over body  . Dizziness and weakness   Immunizations Administered    Name Date Dose VIS Date Route   Moderna Covid-19 Booster Vaccine 03/25/2020 10:51 AM 0.25 mL 11/05/2019 Intramuscular   Manufacturer: Moderna   Lot: [id number]   [place]: [id number]

## 2020-07-07 ENCOUNTER — Ambulatory Visit (INDEPENDENT_AMBULATORY_CARE_PROVIDER_SITE_OTHER): Payer: Medicare (Managed Care) | Admitting: Adult Health

## 2020-07-07 ENCOUNTER — Other Ambulatory Visit: Payer: Self-pay

## 2020-07-07 ENCOUNTER — Encounter: Payer: Self-pay | Admitting: Adult Health

## 2020-07-07 VITALS — BP 126/86 | HR 66 | Wt 139.0 lb

## 2020-07-07 DIAGNOSIS — G40909 Epilepsy, unspecified, not intractable, without status epilepticus: Secondary | ICD-10-CM

## 2020-07-07 NOTE — Patient Instructions (Signed)
Continue Dilantin and Depakote Blood work today If you have any seizure events please let us know.

## 2020-07-07 NOTE — Progress Notes (Signed)
PATIENT: Jeffery [name] DOB: [birth year]  REASON FOR VISIT: follow up HISTORY FROM: patient  HISTORY OF PRESENT ILLNESS: Today 07/07/20:  Jeffery [name] is a 65 year old male with a history of mental retardation and seizures.  He is here today with his sister.  She reports that over the last year he may have had 6 seizures.  She states that he typically presents with shaking and his whole entire body gets stiff.  She does not recall when the last seizure was.  [facility]  Reports that his Dilantin level was recently increased to 250 mg at bedtime due to low levels.  He remains on Depakote 1500 mg daily.  She returns today for an evaluation.  07/07/19: Jeffery [name] is a 65 year old male with a history of mental retardation and seizures.  He returns today for follow-up.  He is here today with his sister.  He continues to live with her.  He remains on Depakote and Dilantin.  He is a part of [facility].  Denies any seizure events.  Returns today for an evaluation.  HISTORY 01/01/19:   Jeffery [name] is a 65 year old male with a history of mental retardation and seizures.  He returns today for follow-up.  He is currently taking Depakote 1500 mg daily and Dilantin 250 mg daily.  He is here today with his caregiver.  They deny any seizure events.  He did go to the emergency room in November for altered mental status but work-up was relatively unremarkable.  He lives with his sister and brother-in-law.  His brother-in-law reports that he is able to complete most ADLs independently.  They help him with his medication and food.  He returns today for evaluation  REVIEW OF SYSTEMS: Out of a complete 14 system review of symptoms, the patient complains only of the following symptoms, and all other reviewed systems are negative.  See HPI  ALLERGIES: No Known Allergies  HOME MEDICATIONS: Outpatient Medications Prior to Visit  Medication Sig Dispense Refill   aspirin EC 81 MG tablet Take 81  mg by mouth daily. Swallow whole.     atorvastatin (LIPITOR) 20 MG tablet Take 20 mg by mouth daily.     AVODART 0.5 MG capsule Take 0.5 mg by mouth at bedtime.      cholecalciferol (VITAMIN D3) 25 MCG (1000 UNIT) tablet Take 1,000 Units by mouth daily.     citalopram (CELEXA) 20 MG tablet Take 20 mg by mouth daily.     divalproex (DEPAKOTE ER) 500 MG 24 hr tablet Take 3 tablets (1,500 mg total) by mouth daily. 90 tablet 4   Multiple Vitamin (MULTIVITAMIN WITH MINERALS) TABS tablet Take 1 tablet by mouth at bedtime.     phenytoin (DILANTIN) 100 MG ER capsule Take 2 capsules (200 mg total) by mouth at bedtime. 180 capsule 3   phenytoin (DILANTIN) 50 MG tablet Chew 1 tablet (50 mg total) by mouth daily. (Patient taking differently: Chew 50 mg by mouth at bedtime.) 90 tablet 3   No facility-administered medications prior to visit.    PAST MEDICAL HISTORY: Past Medical History:  Diagnosis Date   Cancer ([place])    PROSTATE CANCER--PROSTATE BIOPSY PLANNED EVERY 6 MONTHS--NO TX PLANNED UNLESS THE CANCER STARTS PROGRESSING-   Hypercholesteremia    under control   Mental retardation 03-20-11   Sister states pt. developed retardation symptoms about age 23,? whether drug related. Start having seizures. Hx. ETOH abuse in past. -"cognitive issues-can not live  alone"   Seizures ([place])    last seizure 10/27/13  grande maul    PAST SURGICAL HISTORY: Past Surgical History:  Procedure Laterality Date   PROSTATE BIOPSY  03/27/2011   Procedure: BIOPSY TRANSRECTAL ULTRASONIC PROSTATE (TUBP);  Surgeon: Molli Hazard, MD;  Location: [facility];  Service: Urology;  Laterality: N/A;  Prostatic Block        PROSTATE BIOPSY  09/27/2011   Procedure: BIOPSY TRANSRECTAL ULTRASONIC PROSTATE (TUBP);  Surgeon: Molli Hazard, MD;  [facility];  Service: Urology;  Laterality: N/A;        PROSTATE BIOPSY N/A 10/29/2013   Procedure: BIOPSY TRANSRECTAL ULTRASONIC PROSTATE (TUBP);  Surgeon: Alexis Frock, MD;  [facility];  Service: Urology;  Laterality: N/A;    FAMILY HISTORY: Family History  Problem Relation Age of Onset   Cancer Sister        breast   Hypertension Sister    Diabetes Sister    Hypertension Sister     SOCIAL HISTORY: Social History   Socioeconomic History   Marital status: Single    Spouse name: Not on file   Number of children: 0   Years of education: Not on file   Highest education level: Not on file  Occupational History   Occupation: Disability   Tobacco Use   Smoking status: Former    Packs/day: 0.50    Years: 10.00    Pack years: 5.00    Types: Cigarettes   Smokeless tobacco: Never   Tobacco comments:    SNEAKS A SMOKE EVERY NOW AND THEN  Vaping Use   Vaping Use: Never used  Substance and Sexual Activity   Alcohol use: No    Comment: none in 15 yrs,past ETOH abuse Sneaks liquor once every few months   Drug use: No   Sexual activity: Never  Other Topics Concern   Not on file  Social History Narrative   Patient is single and lives with his sister and brother-in-law., [place]   Patient does not have any children.   Patient is right-handed.   Patient drinks two cups of coffee daily.   Social Determinants of Health   Financial Resource Strain: Not on file  Food Insecurity: Not on file  Transportation Needs: Not on file  Physical Activity: Not on file  Stress: Not on file  Social Connections: Not on file  Intimate Partner Violence: Not on file      PHYSICAL EXAM  Vitals:   07/07/20 1342  BP: 126/86  Pulse: 66  Weight: 139 lb (63 kg)   Body mass index is 18.85 kg/m.  Generalized: Well developed, in no acute distress   Neurological examination  Mentation: Alert. Follows all commands.  Nonverbal Cranial nerve II-XII: Pupils were equal round reactive to light. Extraocular movements were full, visual field were full on confrontational test.. Head turning and shoulder shrug  were normal and symmetric. Motor: The motor  testing reveals 5 over 5 strength of all 4 extremities. Good symmetric motor tone is noted throughout.  Sensory: Sensory testing is intact to soft touch on all 4 extremities. No evidence of extinction is noted.  Coordination: Cerebellar testing reveals good finger-nose-finger and heel-to-shin bilaterally.  Gait and station: Gait is normal.  .   DIAGNOSTIC DATA (LABS, IMAGING, TESTING) - I reviewed patient records, labs, notes, testing and imaging myself where available.  Lab Results  Component Value Date   WBC 10.1 11/28/2019   HGB 14.0 11/28/2019   HCT 42.8 11/28/2019  MCV 97.1 11/28/2019   PLT 197 11/28/2019      Component Value Date/Time   NA 144 11/28/2019 0317   NA 141 06/27/2018 1550   K 3.4 (L) 11/28/2019 0317   CL 108 11/28/2019 0317   CO2 26 11/28/2019 0317   GLUCOSE 121 (H) 11/28/2019 0317   BUN 13 11/28/2019 0317   BUN 17 06/27/2018 1550   CREATININE 1.17 11/28/2019 0317   CREATININE 0.83 01/27/2016 1515   CALCIUM 9.2 11/28/2019 0317   PROT 6.8 11/26/2019 0106   PROT 6.6 06/27/2018 1550   ALBUMIN 3.5 11/26/2019 0106   ALBUMIN 4.0 06/27/2018 1550   AST 33 11/26/2019 0106   ALT 22 11/26/2019 0106   ALKPHOS 74 11/26/2019 0106   BILITOT 0.6 11/26/2019 0106   BILITOT 0.3 06/27/2018 1550   GFRNONAA >60 11/28/2019 0317   GFRNONAA >89 01/27/2016 1515   GFRAA >60 12/10/2018 1139   GFRAA >89 01/27/2016 1515   Lab Results  Component Value Date   CHOL 230 (H) 08/17/2011   HDL 80 08/17/2011   LDLCALC 137 (H) 08/17/2011   LDLDIRECT 101 04/26/2015   TRIG 66 08/17/2011   CHOLHDL 2.9 08/17/2011    ASSESSMENT AND PLAN 65 y.o. year old male  has a past medical history of Cancer ([place]), Hypercholesteremia, Mental retardation (03-20-11), and Seizures ([place]). here with:  Seizures  Continue Dilantin 250 mg at bedtime Continue Depakote 1500 mg daily at bedtime Blood work today Advised if he has any seizure events she should let us know Follow-up in 1 year or sooner if  needed   Ward Givens, MSN, NP-C 07/07/2020, 1:53 PM [facility] [address], [place], [place] [address] [phone]

## 2020-07-08 LAB — CBC WITH DIFFERENTIAL/PLATELET
Basophils Absolute: 0 10*3/uL (ref 0.0–0.2)
Basos: 0 %
EOS (ABSOLUTE): 0.2 10*3/uL (ref 0.0–0.4)
Eos: 2 %
Hematocrit: 40 % (ref 37.5–51.0)
Hemoglobin: 13.8 g/dL (ref 13.0–17.7)
Immature Grans (Abs): 0 10*3/uL (ref 0.0–0.1)
Immature Granulocytes: 0 %
Lymphocytes Absolute: 2.1 10*3/uL (ref 0.7–3.1)
Lymphs: 29 %
MCH: 32.4 pg (ref 26.6–33.0)
MCHC: 34.5 g/dL (ref 31.5–35.7)
MCV: 94 fL (ref 79–97)
Monocytes Absolute: 0.7 10*3/uL (ref 0.1–0.9)
Monocytes: 10 %
Neutrophils Absolute: 4.2 10*3/uL (ref 1.4–7.0)
Neutrophils: 59 %
Platelets: 126 10*3/uL — ABNORMAL LOW (ref 150–450)
RBC: 4.26 x10E6/uL (ref 4.14–5.80)
RDW: 12.4 % (ref 11.6–15.4)
WBC: 7.2 10*3/uL (ref 3.4–10.8)

## 2020-07-08 LAB — COMPREHENSIVE METABOLIC PANEL
ALT: 16 IU/L (ref 0–44)
AST: 17 IU/L (ref 0–40)
Albumin/Globulin Ratio: 1.7 (ref 1.2–2.2)
Albumin: 4.5 g/dL (ref 3.8–4.8)
Alkaline Phosphatase: 108 IU/L (ref 44–121)
BUN/Creatinine Ratio: 18 (ref 10–24)
BUN: 18 mg/dL (ref 8–27)
Bilirubin Total: <0.2 mg/dL (ref 0.0–1.2)
CO2: 24 mmol/L (ref 20–29)
Calcium: 10.2 mg/dL (ref 8.6–10.2)
Chloride: 105 mmol/L (ref 96–106)
Creatinine, Ser: 0.99 mg/dL (ref 0.76–1.27)
Globulin, Total: 2.6 g/dL (ref 1.5–4.5)
Glucose: 70 mg/dL (ref 65–99)
Potassium: 4.3 mmol/L (ref 3.5–5.2)
Sodium: 144 mmol/L (ref 134–144)
Total Protein: 7.1 g/dL (ref 6.0–8.5)
eGFR: 83 mL/min/{1.73_m2} (ref >59)

## 2020-07-08 LAB — VALPROIC ACID LEVEL: Valproic Acid Lvl: 65 ug/mL (ref 50–100)

## 2020-07-12 ENCOUNTER — Telehealth: Payer: Self-pay

## 2020-07-12 NOTE — Telephone Encounter (Signed)
Attempted to call pt, LVM for normal results per DPR. Ask pt to call back for questions or concerns.  

## 2020-07-12 NOTE — Telephone Encounter (Signed)
-----   Message from Ward Givens, NP sent at 07/08/2020  9:57 AM EDT ----- Blood work is unremarkable. Platelets are low most likely d/t medication

## 2020-09-22 ENCOUNTER — Telehealth: Payer: Self-pay | Admitting: Adult Health

## 2020-09-22 NOTE — Telephone Encounter (Signed)
Brynda Rim NP [facility], she stated he has had a decrease in his condition with imbalance ,gait difficulties, focal weakness ,increased confusion.  She did Dilantin level which was 21.7.  He does get his medications at home via a bubble pack.  She did decrease his Dilantin from 250 mg nightly to 200 mg nightly He has had no seizures .He also did show UTI with increased leukocytes and nitrites and has placed him on Cipro at this time.  Depakote level was normal.  She is to fax those results and last office note.  She is questioning whether he needs a sooner appointment.  It looks like he has been on 250 mg Dilantin since November 2021.  Once I hear from [facility] I would touch base back with her. I gave her fax #.

## 2020-09-22 NOTE — Telephone Encounter (Signed)
Tanzania Miller,NP @ [facility] called to report pt's Dilantin levels were elevated so she took him down from 250-200 mg she is asking for a call to discuss Keppra as an option for pt.

## 2020-09-27 NOTE — Telephone Encounter (Addendum)
I spoke to Tanzania NP at [facility].  Pt is doing better since on ABX for UTI.  I relayed Megan, NP questioned if the UTI is causing new sx ? since he has had no seizures.  She will send a [place] fax with dilantin level.  I gave her the fax # again.  She will monitor pt and if pt needs to see [place] for symptoms then will call us back.  Pt to see MD for new symptoms.  She appreciated call back.

## 2020-09-28 NOTE — Telephone Encounter (Signed)
Received ofv notes/ labs from [facility]. To MM/NP inbox.

## 2020-10-25 ENCOUNTER — Telehealth: Payer: Self-pay | Admitting: Adult Health

## 2020-10-25 NOTE — Telephone Encounter (Signed)
Need to check lab work CBC. CMP and Dilantin level. Ok to use [employer]

## 2020-10-25 NOTE — Telephone Encounter (Signed)
Called [place] back, she stated since second seizure Caregiver restarted the dilantin total 250mg  on Saturday.  Tanzania got dilantin level this am.  Will fax to [place] when received.  (I am sorry she did not relay this to me eariler).  I made appt for him 11-02-20 at 1530 Tuesday.

## 2020-10-25 NOTE — Telephone Encounter (Signed)
I called [place],  pt has had 2 breakthru seizures (TC in nature duration (not sure).  Once was Wednesday the other Saturday.  Triggers unknown, taking dilantin 200mg  po qhs.  Last seen 06-2020. ? Lab? Dose change?  No appt available soon.

## 2020-10-25 NOTE — Telephone Encounter (Signed)
Clemens Catholic Np from [facility], she states pt is having breakthrough seizures. [place] requesting a call back at [phone].

## 2020-10-26 NOTE — Telephone Encounter (Signed)
Noted. Keep appt 10/18

## 2020-10-27 NOTE — Telephone Encounter (Signed)
Ofv note and Labs received.  To inbox of MM/NP.

## 2020-11-02 ENCOUNTER — Encounter: Payer: Self-pay | Admitting: Adult Health

## 2020-11-02 ENCOUNTER — Ambulatory Visit (INDEPENDENT_AMBULATORY_CARE_PROVIDER_SITE_OTHER): Payer: Medicare (Managed Care) | Admitting: Adult Health

## 2020-11-02 VITALS — BP 128/84 | HR 86 | Ht 68.5 in | Wt 138.0 lb

## 2020-11-02 DIAGNOSIS — G40909 Epilepsy, unspecified, not intractable, without status epilepticus: Secondary | ICD-10-CM

## 2020-11-02 NOTE — Progress Notes (Signed)
PATIENT: Jeffery [name] DOB: July 15, 1955  REASON FOR VISIT: follow up HISTORY FROM: patient  HISTORY OF PRESENT ILLNESS: Today 11/02/20:  Jeffery [name] is a 65 year old male with a history of mental retardation and seizures.  He returns today for follow-up.  The patient had a breakthrough seizure about a week and a half ago.  His sister states that he did miss a dose of his medication.  He was taken Dilantin 200 mg.  His dose of Dilantin have been reduced from 250-200 due to increased blood level.  Dilantin has been increased back to 250 mg daily.  The patient has not had any seizures since.  07/07/20: Jeffery [name] is a 65 year old male with a history of mental retardation and seizures.  He is here today with his sister.  She reports that over the last year he may have had 6 seizures.  She states that he typically presents with shaking and his whole entire body gets stiff.  She does not recall when the last seizure was.  [facility].  Reports that his Dilantin level was recently increased to 250 mg at bedtime due to low levels.  He remains on Depakote 1500 mg daily.  She returns today for an evaluation.  07/07/19: Jeffery [name] is a 65 year old male with a history of mental retardation and seizures.  He returns today for follow-up.  He is here today with his sister.  He continues to live with her.  He remains on Depakote and Dilantin.  He is a part of [facility].  Denies any seizure events.  Returns today for an evaluation.  HISTORY 01/01/19:   Jeffery [name] is a 65 year old male with a history of mental retardation and seizures.  He returns today for follow-up.  He is currently taking Depakote 1500 mg daily and Dilantin 250 mg daily.  He is here today with his caregiver.  They deny any seizure events.  He did go to the emergency room in November for altered mental status but work-up was relatively unremarkable.  He lives with his sister and brother-in-law.  His brother-in-law reports  that he is able to complete most ADLs independently.  They help him with his medication and food.  He returns today for evaluation  REVIEW OF SYSTEMS: Out of a complete 14 system review of symptoms, the patient complains only of the following symptoms, and all other reviewed systems are negative.  See HPI  ALLERGIES: No Known Allergies  HOME MEDICATIONS: Outpatient Medications Prior to Visit  Medication Sig Dispense Refill   aspirin EC 81 MG tablet Take 81 mg by mouth daily. Swallow whole.     atorvastatin (LIPITOR) 20 MG tablet Take 20 mg by mouth daily.     AVODART 0.5 MG capsule Take 0.5 mg by mouth at bedtime.      cholecalciferol (VITAMIN D3) 25 MCG (1000 UNIT) tablet Take 1,000 Units by mouth daily.     citalopram (CELEXA) 20 MG tablet Take 20 mg by mouth daily.     divalproex (DEPAKOTE ER) 500 MG 24 hr tablet Take 3 tablets (1,500 mg total) by mouth daily. 90 tablet 4   Multiple Vitamin (MULTIVITAMIN WITH MINERALS) TABS tablet Take 1 tablet by mouth at bedtime.     phenytoin (DILANTIN) 100 MG ER capsule Take 2 capsules (200 mg total) by mouth at bedtime. 180 capsule 3   phenytoin (DILANTIN) 50 MG tablet Chew 1 tablet (50 mg total) by mouth daily. (Patient taking differently:  Chew 50 mg by mouth at bedtime.) 90 tablet 3   No facility-administered medications prior to visit.    PAST MEDICAL HISTORY: Past Medical History:  Diagnosis Date   Cancer ([place])    PROSTATE CANCER--PROSTATE BIOPSY PLANNED EVERY 6 MONTHS--NO TX PLANNED UNLESS THE CANCER STARTS PROGRESSING-   Hypercholesteremia    under control   Mental retardation 03-20-11   Sister states pt. developed retardation symptoms about age 31,? whether drug related. Start having seizures. Hx. ETOH abuse in past. -"cognitive issues-can not live alone"   Seizures ([place])    last seizure 10/27/13  grande maul    PAST SURGICAL HISTORY: Past Surgical History:  Procedure Laterality Date   PROSTATE BIOPSY  03/27/2011   Procedure:  BIOPSY TRANSRECTAL ULTRASONIC PROSTATE (TUBP);  Surgeon: Molli Hazard, MD;  [facility];  Service: Urology;  Laterality: N/A;  Prostatic Block        PROSTATE BIOPSY  09/27/2011   Procedure: BIOPSY TRANSRECTAL ULTRASONIC PROSTATE (TUBP);  Surgeon: Molli Hazard, MD;  [facility];  Service: Urology;  Laterality: N/A;        PROSTATE BIOPSY N/A 10/29/2013   Procedure: BIOPSY TRANSRECTAL ULTRASONIC PROSTATE (TUBP);  Surgeon: Alexis Frock, MD;  [facility];  Service: Urology;  Laterality: N/A;    FAMILY HISTORY: Family History  Problem Relation Age of Onset   Cancer Sister        breast   Hypertension Sister    Diabetes Sister    Hypertension Sister     SOCIAL HISTORY: Social History   Socioeconomic History   Marital status: Single    Spouse name: Not on file   Number of children: 0   Years of education: Not on file   Highest education level: Not on file  Occupational History   Occupation: Disability   Tobacco Use   Smoking status: Former    Packs/day: 0.50    Years: 10.00    Pack years: 5.00    Types: Cigarettes   Smokeless tobacco: Never   Tobacco comments:    SNEAKS A SMOKE EVERY NOW AND THEN  Vaping Use   Vaping Use: Never used  Substance and Sexual Activity   Alcohol use: No    Comment: none in 15 yrs,past ETOH abuse Sneaks liquor once every few months   Drug use: No   Sexual activity: Never  Other Topics Concern   Not on file  Social History Narrative   Patient is single and lives with his sister    Patient does not have any children.   Patient is right-handed.   Rarely drinks caffeine, avoids at home    Goes to adult day care three days per week   Social Determinants of Health   Financial Resource Strain: Not on file  Food Insecurity: Not on file  Transportation Needs: Not on file  Physical Activity: Not on file  Stress: Not on file  Social Connections: Not on file  Intimate Partner Violence: Not on file       PHYSICAL EXAM  Vitals:   11/02/20 1528  BP: 128/84  Pulse: 86  Weight: 138 lb (62.6 kg)  Height: 5' 8.5" (1.74 m)   Body mass index is 20.68 kg/m.  Generalized: Well developed, in no acute distress   Neurological examination  Mentation: Alert. Follows all commands.  Nonverbal Cranial nerve II-XII: Pupils were equal round reactive to light. Extraocular movements were full, visual field were full on confrontational test.. Head turning and shoulder shrug  were normal and symmetric. Motor: The motor testing reveals 5 over 5 strength of all 4 extremities. Good symmetric motor tone is noted throughout.  Sensory: Sensory testing is intact to soft touch on all 4 extremities. No evidence of extinction is noted.  Coordination: Cerebellar testing reveals good finger-nose-finger and heel-to-shin bilaterally.  Gait and station: Gait is normal.  .   DIAGNOSTIC DATA (LABS, IMAGING, TESTING) - I reviewed patient records, labs, notes, testing and imaging myself where available.  Lab Results  Component Value Date   WBC 7.2 07/07/2020   HGB 13.8 07/07/2020   HCT 40.0 07/07/2020   MCV 94 07/07/2020   PLT 126 (L) 07/07/2020      Component Value Date/Time   NA 144 07/07/2020 1449   K 4.3 07/07/2020 1449   CL 105 07/07/2020 1449   CO2 24 07/07/2020 1449   GLUCOSE 70 07/07/2020 1449   GLUCOSE 121 (H) 11/28/2019 0317   BUN 18 07/07/2020 1449   CREATININE 0.99 07/07/2020 1449   CREATININE 0.83 01/27/2016 1515   CALCIUM 10.2 07/07/2020 1449   PROT 7.1 07/07/2020 1449   ALBUMIN 4.5 07/07/2020 1449   AST 17 07/07/2020 1449   ALT 16 07/07/2020 1449   ALKPHOS 108 07/07/2020 1449   BILITOT <0.2 07/07/2020 1449   GFRNONAA >60 11/28/2019 0317   GFRNONAA >89 01/27/2016 1515   GFRAA >60 12/10/2018 1139   GFRAA >89 01/27/2016 1515   Lab Results  Component Value Date   CHOL 230 (H) 08/17/2011   HDL 80 08/17/2011   LDLCALC 137 (H) 08/17/2011   LDLDIRECT 101 04/26/2015   TRIG 66  08/17/2011   CHOLHDL 2.9 08/17/2011    ASSESSMENT AND PLAN 65 y.o. year old male  has a past medical history of Cancer ([place]), Hypercholesteremia, Mental retardation (03-20-11), and Seizures ([place]). here with:  Seizures  Continue Dilantin 250 mg at bedtime Continue Depakote 1500 mg daily at bedtime Blood work today Advised if he has any seizure events she should let us know Follow-up in 6 months or sooner if needed   Ward Givens, MSN, NP-C 11/02/2020, 3:36 PM [facility] [address] [place], [place] [address] [phone]

## 2020-11-03 LAB — VALPROIC ACID LEVEL: Valproic Acid Lvl: 89 ug/mL (ref 50–100)

## 2020-11-03 LAB — PHENYTOIN LEVEL, TOTAL: Phenytoin (Dilantin), Serum: 12.8 ug/mL (ref 10.0–20.0)

## 2021-03-15 ENCOUNTER — Telehealth: Payer: Self-pay | Admitting: *Deleted

## 2021-03-15 NOTE — Telephone Encounter (Signed)
Received fax from pt's PCP, Clemens Catholic NP, stating pt has some worsening ataxia. No seizures reported. Asking if dilantin dose needs adjusting. Attached labs. Megan NP reviewed. Dilantin dose change not recommended however if PCP concerned about the ataxia, pt would need to be evaluated by [facility] NP before dose is changed. I spoke with [place] NP and discussed. She verbalized understanding. Can keep pt's next f/u with MM NP in April as is for now. She states pt is being treated for a UTI. [place] feels his ataxia may have slightly improved since starting the antibiotic. Also referring pt to urology. She will f/u with [place] if needed.

## 2021-04-08 ENCOUNTER — Ambulatory Visit (HOSPITAL_COMMUNITY)
Admission: EM | Admit: 2021-04-08 | Discharge: 2021-04-08 | Disposition: A | Discharge status: Home / Self Care | Payer: Medicare (Managed Care) | Attending: Nurse Practitioner | Admitting: Nurse Practitioner

## 2021-04-08 ENCOUNTER — Other Ambulatory Visit: Payer: Self-pay

## 2021-04-08 ENCOUNTER — Encounter (HOSPITAL_COMMUNITY): Payer: Self-pay | Admitting: Emergency Medicine

## 2021-04-08 DIAGNOSIS — R8290 Unspecified abnormal findings in urine: Secondary | ICD-10-CM | POA: Diagnosis not present

## 2021-04-08 DIAGNOSIS — T839XXA Unspecified complication of genitourinary prosthetic device, implant and graft, initial encounter: Secondary | ICD-10-CM

## 2021-04-08 MED ORDER — CIPROFLOXACIN HCL 500 MG PO TABS: 500.0000 mg | ORAL_TABLET | Freq: Two times a day (BID) | ORAL | 0 refills | Status: AC

## 2021-04-08 NOTE — ED Triage Notes (Signed)
Pt reports leaking around the foley x 2 days. Sister states she has had to change his pants frequently today.  ?

## 2021-04-08 NOTE — ED Provider Notes (Addendum)
?[place] ? ? ? ?CSN: [id number] ?Arrival date & time: 04/08/21  1928 ? ? ?  ? ?History   ?Chief Complaint ?Chief Complaint  ?Patient presents with  ? Foley Problem  ? ? ?HPI ?Jeffery [name] is a 66 y.o. male.  ? ?Patient presents with legal guardian (sister).  Sister provides entire history.  She reports the patient's Foley catheter has been leaking around it for the past 2 days.  She also reports that the patient has had this catheter for over 2 weeks, he has been dealing with some urinary retention and follows closely with urology.  Sister denies any change in behavior including decreased appetite, fevers, vomiting.  She reports he is not acting any differently than normal. ? ?He has a history of prostate cancer. ? ? ?Past Medical History:  ?Diagnosis Date  ? Cancer [facility])   ? PROSTATE CANCER--PROSTATE BIOPSY PLANNED EVERY 6 MONTHS--NO TX PLANNED UNLESS THE CANCER STARTS PROGRESSING-  ? Hypercholesteremia   ? under control  ? Mental retardation 03-20-11  ? Sister states pt. developed retardation symptoms about age 82,? whether drug related. Start having seizures. Hx. ETOH abuse in past. -"cognitive issues-can not live alone"  ? Seizures ([place])   ? last seizure 10/27/13  [place]  ? ? ?Patient Active Problem List  ? Diagnosis Date Noted  ? Hypernatremia 11/27/2019  ? UTI (urinary tract infection) 11/27/2019  ? AMS (altered mental status) 11/26/2019  ? Cerebral palsy ([place]) 06/27/2018  ? Seizure disorder ([place]) 06/19/2017  ? Acute cystitis without hematuria   ? Altered mental status 12/12/2015  ? Metabolic encephalopathy 46/50/3546  ? Congestive heart failure with left ventricular diastolic dysfunction ([place]) 05/28/2015  ? Prostate cancer ([place]) 04/07/2015  ? 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 Encounter for therapeutic drug monitoring 11/15/2012  ? Routine general medical examination at a health care facility 10/02/2012  ? Muscle spasm of back 09/19/2011  ? Hearing difficulty 08/17/2011  ?  Hypercholesterolemia 08/17/2011  ? Mild intellectual disability 01/30/2008  ? Convulsions ([place]) 01/30/2008  ? ELEVATED PROSTATE SPECIFIC ANTIGEN 01/30/2008  ? ? ?Past Surgical History:  ?Procedure Laterality Date  ? PROSTATE BIOPSY  03/27/2011  ? Procedure: BIOPSY TRANSRECTAL ULTRASONIC PROSTATE (TUBP);  Surgeon: Molli Hazard, MD;  Location: [facility];  Service: Urology;  Laterality: N/A;  Prostatic Block  ? ? ? ?  ? PROSTATE BIOPSY  09/27/2011  ? Procedure: BIOPSY TRANSRECTAL ULTRASONIC PROSTATE (TUBP);  Surgeon: Molli Hazard, MD;  Location: [facility];  Service: Urology;  Laterality: N/A;   ? ? ?  ? PROSTATE BIOPSY N/A 10/29/2013  ? Procedure: BIOPSY TRANSRECTAL ULTRASONIC PROSTATE (TUBP);  Surgeon: Alexis Frock, MD;  Location: [facility];  Service: Urology;  Laterality: N/A;  ? ? ? ? ? ?Home Medications   ? ?Prior to Admission medications   ?Medication Sig Start Date End Date Taking? Authorizing Provider  ?ciprofloxacin (CIPRO) 500 MG tablet Take 1 tablet (500 mg total) by mouth every 12 (twelve) hours for 7 days. 04/08/21 04/15/21 Yes Eulogio Bear, NP  ?aspirin EC 81 MG tablet Take 81 mg by mouth daily. Swallow whole.    [provider]  ?atorvastatin (LIPITOR) 20 MG tablet Take 20 mg by mouth daily.    [provider]  ?AVODART 0.5 MG capsule Take 0.5 mg by mouth at bedtime.     [provider]  ?cholecalciferol (VITAMIN D3) 25 MCG (1000 UNIT) tablet Take 1,000 Units by mouth daily.    [provider]  ?  citalopram (CELEXA) 20 MG tablet Take 20 mg by mouth daily.    [provider]  ?divalproex (DEPAKOTE ER) 500 MG 24 hr tablet Take 3 tablets (1,500 mg total) by mouth daily. 06/27/18   Dohmeier, Asencion Partridge, MD  ?Multiple Vitamin (MULTIVITAMIN WITH MINERALS) TABS tablet Take 1 tablet by mouth at bedtime.    [provider]  ?phenytoin (DILANTIN) 100 MG ER capsule Take 2 capsules (200 mg total) by mouth at bedtime. 06/27/18   Dohmeier, Asencion Partridge, MD   ?phenytoin (DILANTIN) 50 MG tablet Chew 1 tablet (50 mg total) by mouth daily. ?Patient taking differently: Chew 50 mg by mouth at bedtime. 06/27/18   Dohmeier, Asencion Partridge, MD  ? ? ?Family History ?Family History  ?Problem Relation Age of Onset  ? Cancer Sister   ?     breast  ? Hypertension Sister   ? Diabetes Sister   ? Hypertension Sister   ? ? ?Social History ?Social History  ? ?Tobacco Use  ? Smoking status: Former  ?  Packs/day: 0.50  ?  Years: 10.00  ?  Pack years: 5.00  ?  Types: Cigarettes  ? Smokeless tobacco: Never  ? Tobacco comments:  ?  SNEAKS A SMOKE EVERY NOW AND THEN  ?Vaping Use  ? Vaping Use: Never used  ?Substance Use Topics  ? Alcohol use: No  ?  Comment: none in 15 yrs,past ETOH abuse Sneaks liquor once every few months  ? Drug use: No  ? ? ? ?Allergies   ?Patient has no known allergies. ? ? ?Review of Systems ?Review of Systems ?Per HPI ; difficult to perform as patient is nonverbal ? ?Physical Exam ?Triage Vital Signs ?ED Triage Vitals  ?Enc Vitals Group  ?   BP 04/08/21 1944 124/78  ?   Pulse Rate 04/08/21 1944 74  ?   Resp 04/08/21 1944 18  ?   Temp 04/08/21 1944 98.7 ?F (37.1 ?C)  ?   Temp Source 04/08/21 1944 Oral  ?   SpO2 04/08/21 1944 92 %  ?   Weight 04/08/21 1946 138 lb 0.1 oz (62.6 kg)  ?   Height 04/08/21 1946 5' 8.5" (1.74 m)  ?   Head Circumference --   ?   Peak Flow --   ?   Pain Score 04/08/21 1946 0  ?   Pain Loc --   ?   Pain Edu? --   ?   Excl. in [place]? --   ? ?No data found. ? ?Updated Vital Signs ?BP 124/78 (BP Location: Left Arm)   Pulse 74   Temp 98.7 ?F (37.1 ?C) (Oral)   Resp 18   Ht 5' 8.5" (1.74 m)   Wt 138 lb 0.1 oz (62.6 kg)   SpO2 92%   BMI 20.68 kg/m?  ? ?Visual Acuity ?Right Eye Distance:   ?Left Eye Distance:   ?Bilateral Distance:   ? ?Right Eye Near:   ?Left Eye Near:    ?Bilateral Near:    ? ?Physical Exam ?Vitals and nursing note reviewed.  ?Constitutional:   ?   General: He is not in acute distress. ?   Appearance: Normal appearance. He is not  ill-appearing, toxic-appearing or diaphoretic.  ?Abdominal:  ?   General: Abdomen is flat. Bowel sounds are normal. There is no distension.  ?   Palpations: Abdomen is soft.  ?   Tenderness: There is no abdominal tenderness. There is no right CVA tenderness or left CVA tenderness.  ?Genitourinary: ?  Penis: Normal. No tenderness or discharge.   ?   Comments: Foley catheter present; there is urine leaking around the catheter and very little urine in the catheter bag ?Skin: ?   General: Skin is warm and dry.  ?   Coloration: Skin is not jaundiced or pale.  ?   Findings: No erythema.  ?Neurological:  ?   Mental Status: He is alert. Mental status is at baseline.  ?   Motor: No weakness.  ?   Gait: Gait normal.  ? ? ? ?UC Treatments / Results  ?Labs ?(all labs ordered are listed, but only abnormal results are displayed) ?Labs Reviewed - No data to display ? ?EKG ? ? ?Radiology ?No results found. ? ?Procedures ?Procedures (including critical care time) ? ?Medications Ordered in UC ?Medications - No data to display ? ?Initial Impression / Assessment and Plan / UC Course  ?I have reviewed the triage vital signs and the nursing notes. ? ?Pertinent labs & imaging results that were available during my care of the patient were reviewed by me and considered in my medical decision making (see chart for details). ? ?  ?Given malodorous urine and leaking around the Foley catheter, query urinary tract infection secondary to Foley catheter that may be causing bladder spasms and therefore causing leaking around the catheter itself.  Unable to obtain urine sample today because the urine is only leaking around the catheter not to the leg bag.  The foley catheter appears to be in the correct place on examination.  Unfortunately, we do not have the equipment to change out the catheter today; he likely requires a coud? catheter because of his enlarged prostate and history of prostate cancer.  Discussed this with sister and discussed  options.  We discussed that she can proceed to go to the emergency room for further evaluation and to possibly have the catheter replaced.  We can also empirically treat for urinary tract infection with Cipro and have her follow-up with

## 2021-04-08 NOTE — Discharge Instructions (Signed)
-   We are empirically treating Jeffery [name] for a urinary tract infection ?- The leaking around the catheter may be related to a urinary tract infection ?- Please call the Urology office in the morning to see if you can get an appointment sooner than next Thursday to have Jeffery [name] evaluated  ?- If Jeffery [name] develops new symptoms like fever, nausea, or vomiting, please take him to the Emergency Room. ?

## 2021-05-11 ENCOUNTER — Encounter: Payer: Self-pay | Admitting: Adult Health

## 2021-05-11 ENCOUNTER — Ambulatory Visit: Payer: Medicare (Managed Care) | Admitting: Adult Health

## 2021-06-16 ENCOUNTER — Other Ambulatory Visit: Payer: Self-pay | Admitting: Urology

## 2021-06-21 ENCOUNTER — Encounter: Payer: Self-pay | Admitting: *Deleted

## 2021-07-04 NOTE — Patient Instructions (Signed)
DUE TO COVID-19 ONLY TWO VISITORS  (aged 68 and older)  ARE ALLOWED TO COME WITH YOU AND STAY IN THE WAITING ROOM ONLY DURING PRE OP AND PROCEDURE.   **NO VISITORS ARE ALLOWED IN THE SHORT STAY AREA OR RECOVERY ROOM!!**  IF YOU WILL BE ADMITTED INTO THE HOSPITAL YOU ARE ALLOWED ONLY FOUR SUPPORT PEOPLE DURING VISITATION HOURS ONLY (7 AM -8PM)   The support person(s) must pass our screening, gel in and out, and wear a mask at all times, including in the patient's room. Patients must also wear a mask when staff or their support person are in the room. Visitors GUEST BADGE MUST BE WORN VISIBLY  One adult visitor may remain with you overnight and MUST be in the room by 8 P.M.     Your procedure is scheduled on: 07/15/21   Report to [facility] Main Entrance    Report to admitting at 5:15 AM   Call this number if you have problems the morning of surgery [phone]   Follow clear liquid diet the day before surgery   You may have the following liquids until 4:30 AM DAY OF SURGERY  Water Black Coffee (sugar ok, NO MILK/CREAM OR CREAMERS)  Tea (sugar ok, NO MILK/CREAM OR CREAMERS) regular and decaf                             Plain Jell-O (NO RED)                                           Fruit ices (not with fruit pulp, NO RED)                                     Popsicles (NO RED)                                                                  Juice: apple, WHITE grape, WHITE cranberry Sports drinks like Gatorade (NO RED) Clear broth(vegetable,chicken,beef  FOLLOW BOWEL PREP AND ANY ADDITIONAL PRE OP INSTRUCTIONS YOU RECEIVED FROM YOUR SURGEON'S OFFICE!!!     Oral Hygiene is also important to reduce your risk of infection.                                    Remember - BRUSH YOUR TEETH THE MORNING OF SURGERY WITH YOUR REGULAR TOOTHPASTE   Take these medicines the morning of surgery with A SIP OF WATER: Atorvastatin, Citalopram, Depakote, Tamsulosin                                You may not have any metal on your body including jewelry, and body piercing             Do not wear make-up, lotions, powders, cologne, or deodorant              Men may shave face  and neck.   Do not bring valuables to the hospital. [place].   Contacts, dentures or bridgework may not be worn into surgery.   Bring small overnight bag day of surgery.   DO NOT [place]. PHARMACY WILL DISPENSE MEDICATIONS LISTED ON YOUR MEDICATION LIST TO YOU DURING YOUR ADMISSION [place]!              Please read over the following fact sheets you were given: IF YOU HAVE QUESTIONS ABOUT YOUR PRE-OP INSTRUCTIONS PLEASE CALL [facility] - Preparing for Surgery Before surgery, you can play an important role.  Because skin is not sterile, your skin needs to be as free of germs as possible.  You can reduce the number of germs on your skin by washing with CHG (chlorahexidine gluconate) soap before surgery.  CHG is an antiseptic cleaner which kills germs and bonds with the skin to continue killing germs even after washing. Please DO NOT use if you have an allergy to CHG or antibacterial soaps.  If your skin becomes reddened/irritated stop using the CHG and inform your nurse when you arrive at Short Stay. Do not shave (including legs and underarms) for at least 48 hours prior to the first CHG shower.  You may shave your face/neck.  Please follow these instructions carefully:  1.  Shower with CHG Soap the night before surgery and the  morning of surgery.  2.  If you choose to wash your hair, wash your hair first as usual with your normal  shampoo.  3.  After you shampoo, rinse your hair and body thoroughly to remove the shampoo.                             4.  Use CHG as you would any other liquid soap.  You can apply chg directly to the skin and wash.  Gently with a scrungie or clean  washcloth.  5.  Apply the CHG Soap to your body ONLY FROM THE NECK DOWN.   Do   not use on face/ open                           Wound or open sores. Avoid contact with eyes, ears mouth and   genitals (private parts).                       Wash face,  Genitals (private parts) with your normal soap.             6.  Wash thoroughly, paying special attention to the area where your    surgery  will be performed.  7.  Thoroughly rinse your body with warm water from the neck down.  8.  DO NOT shower/wash with your normal soap after using and rinsing off the CHG Soap.                9.  Pat yourself dry with a clean towel.            10.  Wear clean pajamas.            11.  Place clean sheets on your bed the night of your first shower and do not  sleep with pets. Day of Surgery : Do not apply any lotions/deodorants the morning of surgery.  Please wear clean clothes to the hospital/surgery center.  FAILURE TO FOLLOW THESE INSTRUCTIONS MAY RESULT IN THE CANCELLATION OF YOUR SURGERY  PATIENT SIGNATURE_________________________________  NURSE SIGNATURE__________________________________  ________________________________________________________________________

## 2021-07-04 NOTE — Progress Notes (Signed)
COVID Vaccine Completed: yes x1  Date of COVID positive in last 90 days:  PCP - Karsten Fells, MD Cardiologist -   Chest x-ray -  EKG -  Stress Test -  ECHO - 05/13/15 Epic Cardiac Cath -  Pacemaker/ICD device last checked: Spinal Cord Stimulator:  Bowel Prep -   Sleep Study -  CPAP -   Fasting Blood Sugar -  Checks Blood Sugar _____ times a day  Blood Thinner Instructions: Aspirin Instructions: Last Dose:  Activity level:  Can go up a flight of stairs and perform activities of daily living without stopping and without symptoms of chest pain or shortness of breath.  Able to exercise without symptoms  Unable to go up a flight of stairs without symptoms of     Anesthesia review:   Patient denies shortness of breath, fever, cough and chest pain at PAT appointment  Patient verbalized understanding of instructions that were given to them at the PAT appointment. Patient was also instructed that they will need to review over the PAT instructions again at home before surgery.

## 2021-07-05 ENCOUNTER — Encounter (HOSPITAL_COMMUNITY)
Admission: RE | Admit: 2021-07-05 | Discharge: 2021-07-05 | Disposition: A | Discharge status: Home / Self Care | Payer: Medicare (Managed Care) | Source: Ambulatory Visit | Attending: Urology | Admitting: Urology

## 2021-07-05 ENCOUNTER — Encounter (HOSPITAL_COMMUNITY): Payer: Self-pay

## 2021-07-05 VITALS — BP 143/89 | HR 75 | Temp 98.4°F | Resp 14 | Ht 67.0 in | Wt 135.0 lb

## 2021-07-05 DIAGNOSIS — I5032 Chronic diastolic (congestive) heart failure: Secondary | ICD-10-CM | POA: Insufficient documentation

## 2021-07-05 DIAGNOSIS — Z01818 Encounter for other preprocedural examination: Secondary | ICD-10-CM | POA: Diagnosis present

## 2021-07-05 LAB — CBC
HCT: 46.8 % (ref 39.0–52.0)
Hemoglobin: 14.8 g/dL (ref 13.0–17.0)
MCH: 31.8 pg (ref 26.0–34.0)
MCHC: 31.6 g/dL (ref 30.0–36.0)
MCV: 100.4 fL — ABNORMAL HIGH (ref 80.0–100.0)
Platelets: 200 10*3/uL (ref 150–400)
RBC: 4.66 MIL/uL (ref 4.22–5.81)
RDW: 13.1 % (ref 11.5–15.5)
WBC: 6.1 10*3/uL (ref 4.0–10.5)
nRBC: 0 % (ref 0.0–0.2)

## 2021-07-05 LAB — BASIC METABOLIC PANEL
Anion gap: 8 (ref 5–15)
BUN: 17 mg/dL (ref 8–23)
CO2: 27 mmol/L (ref 22–32)
Calcium: 9.7 mg/dL (ref 8.9–10.3)
Chloride: 108 mmol/L (ref 98–111)
Creatinine, Ser: 0.8 mg/dL (ref 0.61–1.24)
GFR, Estimated: >60 mL/min (ref >60)
Glucose, Bld: 87 mg/dL (ref 70–99)
Potassium: 4.3 mmol/L (ref 3.5–5.1)
Sodium: 143 mmol/L (ref 135–145)

## 2021-07-07 ENCOUNTER — Ambulatory Visit: Payer: Medicare (Managed Care) | Admitting: Adult Health

## 2021-07-14 NOTE — Anesthesia Preprocedure Evaluation (Addendum)
Anesthesia Evaluation  Patient identified by MRN, date of birth, ID band Patient awake  General Assessment Comment:Pt with MR  Reviewed: Allergy & Precautions, H&P , NPO status , Patient's Chart, lab work & pertinent test results  Airway Mallampati: I  TM Distance: >3 FB Neck ROM: Full    Dental no notable dental hx. (+) Edentulous Upper, Edentulous Lower   Pulmonary neg pulmonary ROS, Current Smoker, former smoker,    Pulmonary exam normal breath sounds clear to auscultation       Cardiovascular negative cardio ROS Normal cardiovascular exam Rhythm:Regular Rate:Normal     Neuro/Psych Seizures -, Well Controlled,  PSYCHIATRIC DISORDERS Mental retardation negative neurological ROS  negative psych ROS   GI/Hepatic negative GI ROS, Neg liver ROS, (+)     substance abuse (ETOH abuse in past)  alcohol use,   Endo/Other  negative endocrine ROS  Renal/GU negative Renal ROS  negative genitourinary   Musculoskeletal negative musculoskeletal ROS (+)   Abdominal   Peds negative pediatric ROS (+)  Hematology negative hematology ROS (+)   Anesthesia Other Findings Profound intellectual disabilities  Reproductive/Obstetrics negative OB ROS                            Anesthesia Physical  Anesthesia Plan  ASA: 3  Anesthesia Plan: General   Post-op Pain Management: Dilaudid IV, Tylenol PO (pre-op)* and Celebrex PO (pre-op)*   Induction: Intravenous  PONV Risk Score and Plan: 2 and Ondansetron, Dexamethasone and Treatment may vary due to age or medical condition  Airway Management Planned: Simple Face Mask  Additional Equipment: None  Intra-op Plan:   Post-operative Plan: Extubation in OR  Informed Consent: I have reviewed the patients History and Physical, chart, labs and discussed the procedure including the risks, benefits and alternatives for the proposed anesthesia with the patient or  authorized representative who has indicated his/her understanding and acceptance.     Dental advisory given  Plan Discussed with: CRNA and Anesthesiologist  Anesthesia Plan Comments: (2 large bore IV)        Anesthesia Quick Evaluation

## 2021-07-15 ENCOUNTER — Encounter (HOSPITAL_COMMUNITY): Payer: Self-pay | Admitting: Urology

## 2021-07-15 ENCOUNTER — Ambulatory Visit (HOSPITAL_COMMUNITY): Payer: Medicare (Managed Care) | Admitting: Physician Assistant

## 2021-07-15 ENCOUNTER — Other Ambulatory Visit: Payer: Self-pay

## 2021-07-15 ENCOUNTER — Encounter (HOSPITAL_COMMUNITY)
Admission: RE | Disposition: A | Discharge status: Home / Self Care | Payer: Self-pay | Source: Ambulatory Visit | Attending: Urology

## 2021-07-15 ENCOUNTER — Ambulatory Visit (HOSPITAL_BASED_OUTPATIENT_CLINIC_OR_DEPARTMENT_OTHER): Payer: Medicare (Managed Care) | Admitting: Certified Registered Nurse Anesthetist

## 2021-07-15 ENCOUNTER — Observation Stay (HOSPITAL_COMMUNITY)
Admission: RE | Admit: 2021-07-15 | Discharge: 2021-07-17 | Disposition: A | Discharge status: Home / Self Care | Payer: Medicare (Managed Care) | Source: Ambulatory Visit | Attending: Urology | Admitting: Urology

## 2021-07-15 DIAGNOSIS — N472 Paraphimosis: Secondary | ICD-10-CM

## 2021-07-15 DIAGNOSIS — C61 Malignant neoplasm of prostate: Principal | ICD-10-CM | POA: Diagnosis present

## 2021-07-15 DIAGNOSIS — F79 Unspecified intellectual disabilities: Secondary | ICD-10-CM | POA: Diagnosis not present

## 2021-07-15 DIAGNOSIS — R339 Retention of urine, unspecified: Secondary | ICD-10-CM | POA: Diagnosis not present

## 2021-07-15 DIAGNOSIS — Z79899 Other long term (current) drug therapy: Secondary | ICD-10-CM | POA: Diagnosis not present

## 2021-07-15 DIAGNOSIS — Z7982 Long term (current) use of aspirin: Secondary | ICD-10-CM | POA: Insufficient documentation

## 2021-07-15 DIAGNOSIS — Z87891 Personal history of nicotine dependence: Secondary | ICD-10-CM | POA: Insufficient documentation

## 2021-07-15 HISTORY — PX: ROBOT ASSISTED LAPAROSCOPIC RADICAL PROSTATECTOMY: SHX5141

## 2021-07-15 HISTORY — PX: LYMPH NODE DISSECTION: SHX5087

## 2021-07-15 LAB — TYPE AND SCREEN
ABO/RH(D): O POS
Antibody Screen: NEGATIVE

## 2021-07-15 LAB — HEMOGLOBIN AND HEMATOCRIT, BLOOD
HCT: 42.2 % (ref 39.0–52.0)
Hemoglobin: 13.6 g/dL (ref 13.0–17.0)

## 2021-07-15 LAB — ABO/RH: ABO/RH(D): O POS

## 2021-07-15 SURGERY — PROSTATECTOMY, RADICAL, ROBOT-ASSISTED, LAPAROSCOPIC
Anesthesia: General

## 2021-07-15 MED ORDER — FENTANYL CITRATE (PF) 250 MCG/5ML IJ SOLN
INTRAMUSCULAR | Status: AC
  Filled 2021-07-15: qty 5

## 2021-07-15 MED ORDER — DOCUSATE SODIUM 100 MG PO CAPS
100.0000 mg | ORAL_CAPSULE | Freq: Two times a day (BID) | ORAL | Status: DC
  Administered 2021-07-15 – 2021-07-17 (×4): 100 mg via ORAL
  Filled 2021-07-15 (×5): qty 1

## 2021-07-15 MED ORDER — ONDANSETRON HCL 4 MG/2ML IJ SOLN
INTRAMUSCULAR | Status: DC | PRN
  Administered 2021-07-15: 4 mg via INTRAVENOUS

## 2021-07-15 MED ORDER — CEFAZOLIN SODIUM-DEXTROSE 2-4 GM/100ML-% IV SOLN
2.0000 g | INTRAVENOUS | Status: AC
  Administered 2021-07-15: 2 g via INTRAVENOUS
  Filled 2021-07-15: qty 100

## 2021-07-15 MED ORDER — DIPHENHYDRAMINE HCL 50 MG/ML IJ SOLN: 12.5000 mg | Freq: Four times a day (QID) | INTRAMUSCULAR | Status: DC | PRN

## 2021-07-15 MED ORDER — ROCURONIUM BROMIDE 10 MG/ML (PF) SYRINGE
PREFILLED_SYRINGE | INTRAVENOUS | Status: AC
Start: 2021-07-15 — End: ?
  Filled 2021-07-15: qty 10

## 2021-07-15 MED ORDER — ONDANSETRON HCL 4 MG/2ML IJ SOLN: 4.0000 mg | INTRAMUSCULAR | Status: DC | PRN

## 2021-07-15 MED ORDER — DEXAMETHASONE SODIUM PHOSPHATE 10 MG/ML IJ SOLN
INTRAMUSCULAR | Status: DC | PRN
  Administered 2021-07-15: 5 mg via INTRAVENOUS

## 2021-07-15 MED ORDER — FENTANYL CITRATE (PF) 250 MCG/5ML IJ SOLN
INTRAMUSCULAR | Status: DC | PRN
  Administered 2021-07-15 (×3): 50 ug via INTRAVENOUS
  Administered 2021-07-15: 100 ug via INTRAVENOUS

## 2021-07-15 MED ORDER — BUPIVACAINE LIPOSOME 1.3 % IJ SUSP
INTRAMUSCULAR | Status: DC | PRN
  Administered 2021-07-15: 20 mL

## 2021-07-15 MED ORDER — PROPOFOL 10 MG/ML IV BOLUS
INTRAVENOUS | Status: AC
  Filled 2021-07-15: qty 20

## 2021-07-15 MED ORDER — ONDANSETRON HCL 4 MG/2ML IJ SOLN
INTRAMUSCULAR | Status: AC
Start: 2021-07-15 — End: ?
  Filled 2021-07-15: qty 2

## 2021-07-15 MED ORDER — DIVALPROEX SODIUM ER 500 MG PO TB24
1500.0000 mg | ORAL_TABLET | Freq: Every day | ORAL | Status: DC
  Administered 2021-07-15 – 2021-07-16 (×2): 1500 mg via ORAL
  Filled 2021-07-15 (×3): qty 3

## 2021-07-15 MED ORDER — BUPIVACAINE LIPOSOME 1.3 % IJ SUSP
INTRAMUSCULAR | Status: AC
  Filled 2021-07-15: qty 20

## 2021-07-15 MED ORDER — MEPERIDINE HCL 50 MG/ML IJ SOLN: 6.2500 mg | INTRAMUSCULAR | Status: DC | PRN

## 2021-07-15 MED ORDER — SULFAMETHOXAZOLE-TRIMETHOPRIM 800-160 MG PO TABS: 1.0000 | ORAL_TABLET | Freq: Two times a day (BID) | ORAL | 0 refills | Status: DC

## 2021-07-15 MED ORDER — ACETAMINOPHEN 500 MG PO TABS
1000.0000 mg | ORAL_TABLET | Freq: Four times a day (QID) | ORAL | Status: AC
  Administered 2021-07-15 – 2021-07-16 (×4): 1000 mg via ORAL
  Filled 2021-07-15 (×4): qty 2

## 2021-07-15 MED ORDER — ACETAMINOPHEN 500 MG PO TABS
1000.0000 mg | ORAL_TABLET | Freq: Once | ORAL | Status: AC
  Administered 2021-07-15: 1000 mg via ORAL
  Filled 2021-07-15: qty 2

## 2021-07-15 MED ORDER — SODIUM CHLORIDE 0.9 % IV BOLUS
1000.0000 mL | Freq: Once | INTRAVENOUS | Status: AC
  Administered 2021-07-15: 1000 mL via INTRAVENOUS

## 2021-07-15 MED ORDER — CELECOXIB 200 MG PO CAPS
200.0000 mg | ORAL_CAPSULE | Freq: Once | ORAL | Status: AC
Start: 2021-07-15 — End: 2021-07-15
  Administered 2021-07-15: 200 mg via ORAL
  Filled 2021-07-15: qty 1

## 2021-07-15 MED ORDER — LIDOCAINE HCL (CARDIAC) PF 100 MG/5ML IV SOSY
PREFILLED_SYRINGE | INTRAVENOUS | Status: DC | PRN
  Administered 2021-07-15: 80 mg via INTRAVENOUS

## 2021-07-15 MED ORDER — HYDROMORPHONE HCL 1 MG/ML IJ SOLN
0.5000 mg | INTRAMUSCULAR | Status: DC | PRN
  Administered 2021-07-15: 1 mg via INTRAVENOUS
  Filled 2021-07-15: qty 1

## 2021-07-15 MED ORDER — TRIPLE ANTIBIOTIC 3.5-400-5000 EX OINT: 1.0000 | TOPICAL_OINTMENT | Freq: Three times a day (TID) | CUTANEOUS | Status: DC | PRN

## 2021-07-15 MED ORDER — DOCUSATE SODIUM 100 MG PO CAPS: 100.0000 mg | ORAL_CAPSULE | Freq: Two times a day (BID) | ORAL | Status: AC

## 2021-07-15 MED ORDER — OXYCODONE HCL 5 MG PO TABS
5.0000 mg | ORAL_TABLET | ORAL | Status: DC | PRN
  Administered 2021-07-16 – 2021-07-17 (×3): 5 mg via ORAL
  Filled 2021-07-15 (×3): qty 1

## 2021-07-15 MED ORDER — PROPOFOL 10 MG/ML IV BOLUS
INTRAVENOUS | Status: DC | PRN
  Administered 2021-07-15: 130 mg via INTRAVENOUS

## 2021-07-15 MED ORDER — OXYCODONE HCL 5 MG PO TABS
ORAL_TABLET | ORAL | Status: AC
  Filled 2021-07-15: qty 1

## 2021-07-15 MED ORDER — ACETAMINOPHEN 160 MG/5ML PO SOLN: 325.0000 mg | ORAL | Status: DC | PRN

## 2021-07-15 MED ORDER — EPHEDRINE 5 MG/ML INJ
INTRAVENOUS | Status: AC
  Filled 2021-07-15: qty 5

## 2021-07-15 MED ORDER — EPHEDRINE SULFATE-NACL 50-0.9 MG/10ML-% IV SOSY
PREFILLED_SYRINGE | INTRAVENOUS | Status: DC | PRN
  Administered 2021-07-15 (×2): 5 mg via INTRAVENOUS

## 2021-07-15 MED ORDER — MIDAZOLAM HCL 2 MG/2ML IJ SOLN
INTRAMUSCULAR | Status: DC | PRN
  Administered 2021-07-15: 1 mg via INTRAVENOUS

## 2021-07-15 MED ORDER — ROCURONIUM BROMIDE 10 MG/ML (PF) SYRINGE
PREFILLED_SYRINGE | INTRAVENOUS | Status: DC | PRN
  Administered 2021-07-15: 60 mg via INTRAVENOUS
  Administered 2021-07-15 (×3): 20 mg via INTRAVENOUS

## 2021-07-15 MED ORDER — ATORVASTATIN CALCIUM 20 MG PO TABS
20.0000 mg | ORAL_TABLET | Freq: Every day | ORAL | Status: DC
  Administered 2021-07-15 – 2021-07-17 (×3): 20 mg via ORAL
  Filled 2021-07-15 (×3): qty 1

## 2021-07-15 MED ORDER — SODIUM CHLORIDE (PF) 0.9 % IJ SOLN
INTRAMUSCULAR | Status: AC
  Filled 2021-07-15: qty 20

## 2021-07-15 MED ORDER — ROCURONIUM BROMIDE 10 MG/ML (PF) SYRINGE
PREFILLED_SYRINGE | INTRAVENOUS | Status: AC
  Filled 2021-07-15: qty 10

## 2021-07-15 MED ORDER — OXYCODONE HCL 5 MG/5ML PO SOLN: 5.0000 mg | Freq: Once | ORAL | Status: AC | PRN

## 2021-07-15 MED ORDER — ORAL CARE MOUTH RINSE: 15.0000 mL | Freq: Once | OROMUCOSAL | Status: AC

## 2021-07-15 MED ORDER — SODIUM CHLORIDE (PF) 0.9 % IJ SOLN
INTRAMUSCULAR | Status: DC | PRN
  Administered 2021-07-15: 20 mL

## 2021-07-15 MED ORDER — FENTANYL CITRATE PF 50 MCG/ML IJ SOSY
PREFILLED_SYRINGE | INTRAMUSCULAR | Status: AC
  Filled 2021-07-15: qty 3

## 2021-07-15 MED ORDER — HYDROCODONE-ACETAMINOPHEN 5-325 MG PO TABS
1.0000 | ORAL_TABLET | Freq: Four times a day (QID) | ORAL | 0 refills | Status: DC | PRN
Start: 2021-07-15 — End: 2022-04-08

## 2021-07-15 MED ORDER — LIDOCAINE HCL (PF) 2 % IJ SOLN
INTRAMUSCULAR | Status: AC
  Filled 2021-07-15: qty 5

## 2021-07-15 MED ORDER — SUGAMMADEX SODIUM 200 MG/2ML IV SOLN
INTRAVENOUS | Status: DC | PRN
  Administered 2021-07-15: 130 mg via INTRAVENOUS

## 2021-07-15 MED ORDER — PHENYLEPHRINE 80 MCG/ML (10ML) SYRINGE FOR IV PUSH (FOR BLOOD PRESSURE SUPPORT)
PREFILLED_SYRINGE | INTRAVENOUS | Status: DC | PRN
  Administered 2021-07-15 (×2): 160 ug via INTRAVENOUS
  Administered 2021-07-15 (×2): 80 ug via INTRAVENOUS
  Administered 2021-07-15: 160 ug via INTRAVENOUS
  Administered 2021-07-15 (×2): 80 ug via INTRAVENOUS
  Administered 2021-07-15: 160 ug via INTRAVENOUS

## 2021-07-15 MED ORDER — HYOSCYAMINE SULFATE 0.125 MG SL SUBL: 0.1250 mg | SUBLINGUAL_TABLET | SUBLINGUAL | Status: DC | PRN

## 2021-07-15 MED ORDER — ACETAMINOPHEN 325 MG PO TABS: 325.0000 mg | ORAL_TABLET | ORAL | Status: DC | PRN

## 2021-07-15 MED ORDER — SODIUM CHLORIDE (PF) 0.9 % IJ SOLN
INTRAMUSCULAR | Status: AC
  Filled 2021-07-15: qty 10

## 2021-07-15 MED ORDER — CITALOPRAM HYDROBROMIDE 20 MG PO TABS
20.0000 mg | ORAL_TABLET | Freq: Every day | ORAL | Status: DC
  Administered 2021-07-15 – 2021-07-17 (×3): 20 mg via ORAL
  Filled 2021-07-15 (×3): qty 1

## 2021-07-15 MED ORDER — SODIUM CHLORIDE 0.45 % IV SOLN
INTRAVENOUS | Status: DC
  Administered 2021-07-15: 1000 mL via INTRAVENOUS

## 2021-07-15 MED ORDER — MIDAZOLAM HCL 2 MG/2ML IJ SOLN
INTRAMUSCULAR | Status: AC
  Filled 2021-07-15: qty 2

## 2021-07-15 MED ORDER — PHENYTOIN SODIUM EXTENDED 100 MG PO CAPS
200.0000 mg | ORAL_CAPSULE | Freq: Every day | ORAL | Status: DC
  Administered 2021-07-15 – 2021-07-16 (×2): 200 mg via ORAL
  Filled 2021-07-15 (×2): qty 2

## 2021-07-15 MED ORDER — POLYETHYLENE GLYCOL 3350 17 GM/SCOOP PO POWD
1.0000 | Freq: Once | ORAL | Status: DC
  Filled 2021-07-15: qty 255

## 2021-07-15 MED ORDER — PHENYTOIN 50 MG PO CHEW
50.0000 mg | CHEWABLE_TABLET | Freq: Every day | ORAL | Status: DC
  Administered 2021-07-15 – 2021-07-16 (×2): 50 mg via ORAL
  Filled 2021-07-15 (×2): qty 1

## 2021-07-15 MED ORDER — DIPHENHYDRAMINE HCL 12.5 MG/5ML PO ELIX: 12.5000 mg | ORAL_SOLUTION | Freq: Four times a day (QID) | ORAL | Status: DC | PRN

## 2021-07-15 MED ORDER — STERILE WATER FOR IRRIGATION IR SOLN
Status: DC | PRN
  Administered 2021-07-15: 1000 mL

## 2021-07-15 MED ORDER — OXYCODONE HCL 5 MG PO TABS
5.0000 mg | ORAL_TABLET | Freq: Once | ORAL | Status: AC | PRN
  Administered 2021-07-15: 5 mg via ORAL

## 2021-07-15 MED ORDER — FENTANYL CITRATE PF 50 MCG/ML IJ SOSY
25.0000 ug | PREFILLED_SYRINGE | INTRAMUSCULAR | Status: DC | PRN
  Administered 2021-07-15 (×4): 25 ug via INTRAVENOUS
  Administered 2021-07-15: 50 ug via INTRAVENOUS

## 2021-07-15 MED ORDER — DEXAMETHASONE SODIUM PHOSPHATE 10 MG/ML IJ SOLN
INTRAMUSCULAR | Status: AC
  Filled 2021-07-15: qty 1

## 2021-07-15 MED ORDER — PHENYLEPHRINE 80 MCG/ML (10ML) SYRINGE FOR IV PUSH (FOR BLOOD PRESSURE SUPPORT)
PREFILLED_SYRINGE | INTRAVENOUS | Status: AC
  Filled 2021-07-15: qty 10

## 2021-07-15 MED ORDER — LACTATED RINGERS IV SOLN: INTRAVENOUS | Status: DC

## 2021-07-15 MED ORDER — ONDANSETRON HCL 4 MG/2ML IJ SOLN: 4.0000 mg | Freq: Once | INTRAMUSCULAR | Status: DC | PRN

## 2021-07-15 MED ORDER — CHLORHEXIDINE GLUCONATE 0.12 % MT SOLN
15.0000 mL | Freq: Once | OROMUCOSAL | Status: AC
  Administered 2021-07-15: 15 mL via OROMUCOSAL

## 2021-07-15 MED ORDER — LACTATED RINGERS IR SOLN
Status: DC | PRN
  Administered 2021-07-15: 1

## 2021-07-15 SURGICAL SUPPLY — 84 items
ADH SKN CLS APL DERMABOND .7 (GAUZE/BANDAGES/DRESSINGS) ×2
APL PRP STRL LF DISP 70% ISPRP (MISCELLANEOUS) ×2
APL SWBSTK 6 STRL LF DISP (MISCELLANEOUS) ×2
APPLICATOR COTTON TIP 6 STRL (MISCELLANEOUS) ×2 IMPLANT
APPLICATOR COTTON TIP 6IN STRL (MISCELLANEOUS) ×3
BAG COUNTER SPONGE SURGICOUNT (BAG) IMPLANT
BAG SPNG CNTER NS LX DISP (BAG)
BNDG CMPR 75X21 PLY HI ABS (MISCELLANEOUS) ×2
BNDG COHESIVE 1X5 TAN STRL LF (GAUZE/BANDAGES/DRESSINGS) ×1 IMPLANT
CATH FOLEY 2WAY SLVR 18FR 30CC (CATHETERS) ×3 IMPLANT
CATH TIEMANN FOLEY 18FR 5CC (CATHETERS) ×3 IMPLANT
CHLORAPREP W/TINT 26 (MISCELLANEOUS) ×3 IMPLANT
CLIP LIGATING HEM O LOK PURPLE (MISCELLANEOUS) ×12 IMPLANT
CNTNR URN SCR LID CUP LEK RST (MISCELLANEOUS) ×2 IMPLANT
CONT SPEC 4OZ STRL OR WHT (MISCELLANEOUS) ×3
COVER SURGICAL LIGHT HANDLE (MISCELLANEOUS) ×3 IMPLANT
COVER TIP SHEARS 8 DVNC (MISCELLANEOUS) ×2 IMPLANT
COVER TIP SHEARS 8MM DA VINCI (MISCELLANEOUS) ×3
CUTTER ECHEON FLEX ENDO 45 340 (ENDOMECHANICALS) ×3 IMPLANT
DERMABOND ADVANCED (GAUZE/BANDAGES/DRESSINGS) ×1
DERMABOND ADVANCED .7 DNX12 (GAUZE/BANDAGES/DRESSINGS) ×2 IMPLANT
DRAIN CHANNEL RND F F (WOUND CARE) IMPLANT
DRAPE ARM DVNC X/XI (DISPOSABLE) ×8 IMPLANT
DRAPE COLUMN DVNC XI (DISPOSABLE) ×2 IMPLANT
DRAPE DA VINCI XI ARM (DISPOSABLE) ×12
DRAPE DA VINCI XI COLUMN (DISPOSABLE) ×3
DRAPE SURG IRRIG POUCH 19X23 (DRAPES) ×3 IMPLANT
DRSG TEGADERM 4X4.75 (GAUZE/BANDAGES/DRESSINGS) ×3 IMPLANT
ELECT PENCIL ROCKER SW 15FT (MISCELLANEOUS) IMPLANT
ELECT REM PT RETURN 15FT ADLT (MISCELLANEOUS) ×3 IMPLANT
GAUZE 4X4 16PLY ~~LOC~~+RFID DBL (SPONGE) IMPLANT
GAUZE SPONGE 2X2 8PLY STRL LF (GAUZE/BANDAGES/DRESSINGS) IMPLANT
GAUZE STRETCH 2X75IN STRL (MISCELLANEOUS) ×1 IMPLANT
GAUZE XEROFORM 1X8 LF (GAUZE/BANDAGES/DRESSINGS) ×1 IMPLANT
GLOVE BIO SURGEON STRL SZ 6.5 (GLOVE) ×3 IMPLANT
GLOVE BIOGEL PI IND STRL 7.5 (GLOVE) ×2 IMPLANT
GLOVE BIOGEL PI INDICATOR 7.5 (GLOVE) ×1
GLOVE SURG LX 7.5 STRW (GLOVE) ×2
GLOVE SURG LX STRL 7.5 STRW (GLOVE) ×4 IMPLANT
GOWN SRG XL LVL 4 BRTHBL STRL (GOWNS) ×2 IMPLANT
GOWN STRL NON-REIN XL LVL4 (GOWNS) ×3
HOLDER FOLEY CATH W/STRAP (MISCELLANEOUS) ×3 IMPLANT
IRRIG SUCT STRYKERFLOW 2 WTIP (MISCELLANEOUS) ×3
IRRIGATION SUCT STRKRFLW 2 WTP (MISCELLANEOUS) ×2 IMPLANT
IV LACTATED RINGERS 1000ML (IV SOLUTION) ×3 IMPLANT
KIT PROCEDURE DA VINCI SI (MISCELLANEOUS) ×3
KIT PROCEDURE DVNC SI (MISCELLANEOUS) ×2 IMPLANT
KIT TURNOVER KIT A (KITS) IMPLANT
NDL INSUFFLATION 14GA 120MM (NEEDLE) ×2 IMPLANT
NDL SPNL 22GX7 QUINCKE BK (NEEDLE) ×2 IMPLANT
NEEDLE INSUFFLATION 14GA 120MM (NEEDLE) ×3 IMPLANT
NEEDLE SPNL 22GX7 QUINCKE BK (NEEDLE) ×3 IMPLANT
PACK ROBOT UROLOGY CUSTOM (CUSTOM PROCEDURE TRAY) ×3 IMPLANT
PAD POSITIONING PINK XL (MISCELLANEOUS) ×3 IMPLANT
PORT ACCESS TROCAR AIRSEAL 12 (TROCAR) ×2 IMPLANT
PORT ACCESS TROCAR AIRSEAL 5M (TROCAR) ×1
RELOAD STAPLE 45 4.1 GRN THCK (STAPLE) ×2 IMPLANT
SEAL CANN UNIV 5-8 DVNC XI (MISCELLANEOUS) ×8 IMPLANT
SEAL XI 5MM-8MM UNIVERSAL (MISCELLANEOUS) ×12
SET TRI-LUMEN FLTR TB AIRSEAL (TUBING) ×3 IMPLANT
SOLUTION ELECTROLUBE (MISCELLANEOUS) ×3 IMPLANT
SPIKE FLUID TRANSFER (MISCELLANEOUS) ×3 IMPLANT
SPONGE GAUZE 2X2 STER 10/PKG (GAUZE/BANDAGES/DRESSINGS) ×1
SPONGE T-LAP 4X18 ~~LOC~~+RFID (SPONGE) ×3 IMPLANT
STAPLE RELOAD 45 GRN (STAPLE) ×2 IMPLANT
STAPLE RELOAD 45MM GREEN (STAPLE) ×3
SUT ETHILON 3 0 PS 1 (SUTURE) ×3 IMPLANT
SUT MNCRL AB 4-0 PS2 18 (SUTURE) ×6 IMPLANT
SUT PDS AB 1 CT1 27 (SUTURE) ×6 IMPLANT
SUT VIC AB 0 CT1 27 (SUTURE) ×6
SUT VIC AB 0 CT1 27XBRD ANTBC (SUTURE) IMPLANT
SUT VIC AB 2-0 SH 27 (SUTURE) ×3
SUT VIC AB 2-0 SH 27X BRD (SUTURE) ×2 IMPLANT
SUT VIC AB 3-0 SH 27 (SUTURE) ×15
SUT VIC AB 3-0 SH 27XBRD (SUTURE) IMPLANT
SUT VICRYL 0 UR6 27IN ABS (SUTURE) ×3 IMPLANT
SUT VLOC BARB 180 ABS3/0GR12 (SUTURE) ×9
SUTURE VLOC BRB 180 ABS3/0GR12 (SUTURE) ×6 IMPLANT
SYR 27GX1/2 1ML LL SAFETY (SYRINGE) ×3 IMPLANT
SYS BAG RETRIEVAL 10MM (BASKET) ×3
SYSTEM BAG RETRIEVAL 10MM (BASKET) IMPLANT
TOWEL OR NON WOVEN STRL DISP B (DISPOSABLE) ×3 IMPLANT
TROCAR Z-THREAD FIOS 5X100MM (TROCAR) IMPLANT
WATER STERILE IRR 1000ML POUR (IV SOLUTION) ×3 IMPLANT

## 2021-07-15 NOTE — Transfer of Care (Signed)
Immediate Anesthesia Transfer of Care Note  Patient: Jeffery [name]  Procedure(s) Performed: XI ROBOTIC ASSISTED LAPAROSCOPIC RADICAL PROSTATECTOMY; CIRCUMCISION PELVIC LYMPH NODE DISSECTION (Bilateral)  Patient Location: PACU  Anesthesia Type:General  Level of Consciousness: awake, drowsy and patient cooperative  Airway & Oxygen Therapy: Patient Spontanous Breathing and Patient connected to face mask oxygen  Post-op Assessment: Report given to RN and Post -op Vital signs reviewed and stable  Post vital signs: Reviewed and stable  Last Vitals:  Vitals Value Taken Time  BP 135/83 07/15/21 1106  Temp 36.8 C 07/15/21 1106  Pulse 78 07/15/21 1110  Resp 19 07/15/21 1110  SpO2 100 % 07/15/21 1110  Vitals shown include unvalidated device data.  Last Pain:  Vitals:   07/15/21 0552  TempSrc: Oral         Complications: No notable events documented.

## 2021-07-15 NOTE — Brief Op Note (Signed)
07/15/2021  10:48 AM  PATIENT:  Jeffery [name]  66 y.o. male  PRE-OPERATIVE DIAGNOSIS:  PROSTATE CANCER, URINARY RETENTION  POST-OPERATIVE DIAGNOSIS:  PROSTATE CANCER, URINARY RETENTION  PROCEDURE:  Procedure(s): XI ROBOTIC ASSISTED LAPAROSCOPIC RADICAL PROSTATECTOMY; CIRCUMCISION (N/A) PELVIC LYMPH NODE DISSECTION (Bilateral)  SURGEON:  Surgeon(s) and Role:    Alexis Frock, MD - Primary  PHYSICIAN ASSISTANT:   ASSISTANTS: Clemetine Marker PA   ANESTHESIA:   local and general  EBL:  275 mL   BLOOD ADMINISTERED:none  DRAINS:  1 - JP to bulb; 2 - Foley to gravity    LOCAL MEDICATIONS USED:  MARCAINE     SPECIMEN:  Source of Specimen:  1 - prostatectomy; 2 - pelvic lymph nodes  DISPOSITION OF SPECIMEN:  PATHOLOGY  COUNTS:  YES  TOURNIQUET:  * No tourniquets in log *  DICTATION: .Other Dictation: Dictation Number [id number]  PLAN OF CARE: Admit for overnight observation  PATIENT DISPOSITION:  PACU - hemodynamically stable.   Delay start of Pharmacological VTE agent (>24hrs) due to surgical blood loss or risk of bleeding: no

## 2021-07-15 NOTE — Plan of Care (Signed)
  Problem: Education: Goal: Knowledge of General Education information will improve Description: Including pain rating scale, medication(s)/side effects and non-pharmacologic comfort measures Outcome: Progressing   Problem: Pain Managment: Goal: General experience of comfort will improve Outcome: Progressing   Problem: Skin Integrity: Goal: Risk for impaired skin integrity will decrease Outcome: Progressing   

## 2021-07-15 NOTE — H&P (Signed)
Jeffery [name] is an 66 y.o. male.    Chief Complaint: Pre-Op Prostatectomy  HPI:   1 - Very Low Risk Prostate Cancer - 02/2011 PSA 6.77 --> Gleason 6 in >5% 2 cores; 09/2011 Rebiopsy stable Gleason 6 2 cores --> Surveillance   Summarized Surveillance Course:  08/2012 PSA 4.96 (on finasteride) --> did not comply with surveillance biopsy and lost to follow up.  10/2013 PSA 7.61 (on avodart) --> Gleason 6 10% RLA only on surveillance biopsy under general, future BX for drastic PSA or exam changes only  10/2020 - PSA 2.97 (on avodart)    2 - Lower Urinary Tract Symptoms / Urinary Retention - PVR's on finasteride <177m previously (2022). Most recently on Avodart + tamsulosin BID. FPilar Plateurinary retention 2023 and failed trial of void x 2. CT 05/2021 with 95gm prostate with LARGE median lobe. NO strictures on cysto.   PMH sig for mental retardation (lives with family, can do round the house activites but does not make independent decisions, gets help through [facility] as well), seizure disorder. HIs sister Jeffery [name] is primary caretaker and dMedia planner   Today " [name] is seen to proceed with radical prostatedtomy for low grade cancer with refracotry retention. No interval fevers. He has been on bactrim pre-op to reduce colonization.   Past Medical History:  Diagnosis Date   Cancer ([facility]    PROSTATE CANCER--PROSTATE BIOPSY PLANNED EVERY 6 MONTHS--NO TX PLANNED UNLESS THE CANCER STARTS PROGRESSING-   Hypercholesteremia    under control   Mental retardation 03-20-11   Sister states pt. developed retardation symptoms about age 66? whether drug related. Start having seizures. Hx. ETOH abuse in past. -"cognitive issues-can not live alone"   Seizures ([facility]    last seizure 10/27/13  [place]    Past Surgical History:  Procedure Laterality Date   PROSTATE BIOPSY  03/27/2011   Procedure: BIOPSY TRANSRECTAL ULTRASONIC PROSTATE (TUBP);  Surgeon: DMolli Hazard MD;  Location: [facility];  Service:  Urology;  Laterality: N/A;  Prostatic Block        PROSTATE BIOPSY  09/27/2011   Procedure: BIOPSY TRANSRECTAL ULTRASONIC PROSTATE (TUBP);  Surgeon: DMolli Hazard MD;  Location: [facility];  Service: Urology;  Laterality: N/A;        PROSTATE BIOPSY N/A 10/29/2013   Procedure: BIOPSY TRANSRECTAL ULTRASONIC PROSTATE (TUBP);  Surgeon: TAlexis Frock MD;  Location: [facility];  Service: Urology;  Laterality: N/A;    Family History  Problem Relation Age of Onset   Cancer Sister        breast   Hypertension Sister    Diabetes Sister    Hypertension Sister    Social History:  reports that he has quit smoking. His smoking use included cigarettes. He has a 5.00 pack-year smoking history. He has never used smokeless tobacco. He reports that he does not drink alcohol and does not use drugs.  Allergies: No Known Allergies  Medications Prior to Admission  Medication Sig Dispense Refill   aspirin EC 81 MG tablet Take 81 mg by mouth daily. Swallow whole.     atorvastatin (LIPITOR) 20 MG tablet Take 20 mg by mouth daily.     AVODART 0.5 MG capsule Take 0.5 mg by mouth at bedtime.      cholecalciferol (VITAMIN D3) 25 MCG (1000 UNIT) tablet Take 1,000 Units by mouth daily.     citalopram (CELEXA) 20 MG tablet Take 20 mg by mouth daily.     divalproex (DEPAKOTE ER) 500 MG 24  hr tablet Take 3 tablets (1,500 mg total) by mouth daily. 90 tablet 4   Multiple Vitamin (MULTIVITAMIN WITH MINERALS) TABS tablet Take 1 tablet by mouth at bedtime.     phenytoin (DILANTIN) 100 MG ER capsule Take 2 capsules (200 mg total) by mouth at bedtime. 180 capsule 3   phenytoin (DILANTIN) 50 MG tablet Chew 1 tablet (50 mg total) by mouth daily. (Patient taking differently: Chew 50 mg by mouth at bedtime.) 90 tablet 3   tamsulosin (FLOMAX) 0.4 MG CAPS capsule Take 0.4 mg by mouth 2 (two) times daily.     tolterodine (DETROL) 2 MG tablet Take 2 mg by mouth 2 (two) times daily as needed (bladder spasms).      No  results found for this or any previous visit (from the past 48 hour(s)). No results found.  Review of Systems  Constitutional:  Negative for chills and fatigue.  All other systems reviewed and are negative.   Blood pressure 137/88, pulse 70, temperature 99 F (37.2 C), temperature source Oral, resp. rate 15, SpO2 97 %. Physical Exam Vitals reviewed.  Constitutional:      Comments: AOx1, at baseline. Cooperative.   HENT:     Head: Normocephalic.  Eyes:     Pupils: Pupils are equal, round, and reactive to light.  Cardiovascular:     Rate and Rhythm: Normal rate.  Pulmonary:     Effort: Pulmonary effort is normal.  Abdominal:     General: Abdomen is flat.  Genitourinary:    Penis: Normal.      Comments: Foley in place with non-foul urine.  Skin:    General: Skin is warm.  Neurological:     Mental Status: He is alert.  Psychiatric:        Mood and Affect: Mood normal.      Assessment/Plan  Proceed as planned with robotic prostatectomy, node dissection for definitive management of prostate cancer and retention. Risks, benefits, alternatives, expected peri-op course discussed previosly and reiterated today with pt and his sister.   Alexis Frock, MD 07/15/2021, 6:47 AM

## 2021-07-15 NOTE — Anesthesia Procedure Notes (Signed)
Procedure Name: Intubation Date/Time: 07/15/2021 7:37 AM  Performed by: Raenette Rover, CRNAPre-anesthesia Checklist: Patient identified, Emergency Drugs available, Suction available and Patient being monitored Patient Re-evaluated:Patient Re-evaluated prior to induction Oxygen Delivery Method: Circle system utilized Preoxygenation: Pre-oxygenation with 100% oxygen Induction Type: IV induction Ventilation: Mask ventilation without difficulty Laryngoscope Size: Mac and 4 Grade View: Grade I Tube type: Oral Tube size: 7.5 mm Number of attempts: 1 Airway Equipment and Method: Stylet Placement Confirmation: ETT inserted through vocal cords under direct vision, positive ETCO2 and breath sounds checked- equal and bilateral Secured at: 21 cm Tube secured with: Tape Dental Injury: Teeth and Oropharynx as per pre-operative assessment

## 2021-07-15 NOTE — Discharge Instructions (Addendum)

## 2021-07-16 DIAGNOSIS — C61 Malignant neoplasm of prostate: Secondary | ICD-10-CM | POA: Diagnosis not present

## 2021-07-16 LAB — HEMOGLOBIN AND HEMATOCRIT, BLOOD
HCT: 40 % (ref 39.0–52.0)
Hemoglobin: 13 g/dL (ref 13.0–17.0)

## 2021-07-16 LAB — BASIC METABOLIC PANEL
Anion gap: 6 (ref 5–15)
BUN: 8 mg/dL (ref 8–23)
CO2: 26 mmol/L (ref 22–32)
Calcium: 9 mg/dL (ref 8.9–10.3)
Chloride: 109 mmol/L (ref 98–111)
Creatinine, Ser: 0.88 mg/dL (ref 0.61–1.24)
GFR, Estimated: >60 mL/min (ref >60)
Glucose, Bld: 106 mg/dL — ABNORMAL HIGH (ref 70–99)
Potassium: 4.2 mmol/L (ref 3.5–5.1)
Sodium: 141 mmol/L (ref 135–145)

## 2021-07-16 MED ORDER — ORAL CARE MOUTH RINSE: 15.0000 mL | OROMUCOSAL | Status: DC | PRN

## 2021-07-16 MED ORDER — CHLORHEXIDINE GLUCONATE CLOTH 2 % EX PADS
6.0000 | MEDICATED_PAD | Freq: Every day | CUTANEOUS | Status: DC
  Administered 2021-07-16 – 2021-07-17 (×2): 6 via TOPICAL

## 2021-07-16 NOTE — Progress Notes (Signed)
1 Day Post-Op Subjective: Patient reports good pain control, negative flatus, ambulating in halls  Objective: Vital signs in last 24 hours: Temp:  [98 F (36.7 C)-98.9 F (37.2 C)] 98.2 F (36.8 C) (07/01 1133) Pulse Rate:  [71-76] 72 (07/01 1133) Resp:  [15-17] 16 (07/01 1133) BP: (100-124)/(69-79) 124/73 (07/01 1133) SpO2:  [97 %-99 %] 99 % (07/01 1133)  Intake/Output from previous day: 06/30 0701 - 07/01 0700 In: 3033.9 [I.V.:3033.9] Out: 2567 [Urine:2200; Drains:92; Blood:275] Intake/Output this shift: Total I/O In: 835 [P.O.:120; I.V.:715] Out: 1995 [IWLNL:8921; Drains:45]  Physical Exam:  General:alert, cooperative, and appears stated age GI: soft, non tender, normal bowel sounds, no palpable masses, no organomegaly, no inguinal hernia Male genitalia: not done Extremities: extremities normal, atraumatic, no cyanosis or edema  Lab Results: Recent Labs    07/15/21 1113 07/16/21 0455  HGB 13.6 13.0  HCT 42.2 40.0   BMET Recent Labs    07/16/21 0455  NA 141  K 4.2  CL 109  CO2 26  GLUCOSE 106*  BUN 8  CREATININE 0.88  CALCIUM 9.0   No results for input(s): "LABPT", "INR" in the last 72 hours. No results for input(s): "LABURIN" in the last 72 hours. Results for orders placed or performed during the hospital encounter of 11/25/19  Urine culture     Status: Abnormal   Collection Time: 11/26/19  3:42 AM   Specimen: Urine, Clean Catch  Result Value Ref Range Status   Specimen Description URINE, CLEAN CATCH  Final   Special Requests   Final    NONE Performed at [facility], [place] [address], [place], [place] [address]    Culture MULTIPLE SPECIES PRESENT, SUGGEST RECOLLECTION (A)  Final   Report Status 11/27/2019 FINAL  Final  Respiratory Panel by RT PCR (Flu A&B, Covid) - Urine, Clean Catch     Status: None   Collection Time: 11/26/19  6:03 AM   Specimen: Urine, Clean Catch; Nasopharyngeal  Result Value Ref Range Status   SARS Coronavirus 2 by RT  PCR NEGATIVE NEGATIVE Final    Comment: (NOTE) SARS-CoV-2 target nucleic acids are NOT DETECTED.  The SARS-CoV-2 RNA is generally detectable in upper respiratoy specimens during the acute phase of infection. The lowest concentration of SARS-CoV-2 viral copies this assay can detect is 131 copies/mL. A negative result does not preclude SARS-Cov-2 infection and should not be used as the sole basis for treatment or other patient management decisions. A negative result may occur with  improper specimen collection/handling, submission of specimen other than nasopharyngeal swab, presence of viral mutation(s) within the areas targeted by this assay, and inadequate number of viral copies (<131 copies/mL). A negative result must be combined with clinical observations, patient history, and epidemiological information. The expected result is Negative.  Fact Sheet for Patients:  PinkCheek.be  Fact Sheet for Healthcare Providers:  GravelBags.it  This test is no t yet approved or cleared by the [place] FDA and  has been authorized for detection and/or diagnosis of SARS-CoV-2 by FDA under an Emergency Use Authorization (EUA). This EUA will remain  in effect (meaning this test can be used) for the duration of the COVID-19 declaration under Section 564(b)(1) of the Act, 21 U.S.C. section 360bbb-3(b)(1), unless the authorization is terminated or revoked sooner.     Influenza A by PCR NEGATIVE NEGATIVE Final   Influenza B by PCR NEGATIVE NEGATIVE Final    Comment: (NOTE) The Xpert Xpress SARS-CoV-2/FLU/RSV assay is intended as an aid in  the diagnosis of influenza from Nasopharyngeal swab specimens and  should not be used as a sole basis for treatment. Nasal washings and  aspirates are unacceptable for Xpert Xpress SARS-CoV-2/FLU/RSV  testing.  Fact Sheet for Patients: PinkCheek.be  Fact Sheet for  Healthcare Providers: GravelBags.it  This test is not yet approved or cleared by the [place] FDA and  has been authorized for detection and/or diagnosis of SARS-CoV-2 by  FDA under an Emergency Use Authorization (EUA). This EUA will remain  in effect (meaning this test can be used) for the duration of the  Covid-19 declaration under Section 564(b)(1) of the Act, 21  U.S.C. section 360bbb-3(b)(1), unless the authorization is  terminated or revoked. Performed at [facility], [place] [address] [place], [place] [address]   Culture, Urine     Status: None   Collection Time: 11/27/19  4:19 PM   Specimen: Urine, Random  Result Value Ref Range Status   Specimen Description URINE, RANDOM  Final   Special Requests NONE  Final   Culture   Final    NO GROWTH Performed at [facility], [place] [address] [place], [place] [address]    Report Status 11/28/2019 FINAL  Final    Studies/Results: No results found.  Assessment/Plan: PO#1 Radical prostatectomy Continue current pain control regiment Ambulate in halls with assistance Advance diet to regular D/c IVF Discharge tomorrow   LOS: 0 days   Nicolette Bang 07/16/2021, 6:05 PM

## 2021-07-16 NOTE — Progress Notes (Signed)
Attempted to walk patient on hallway last night but was unsuccessful.  Pt gait is unstable. Was able to walk to the door and back to bed with 4 person assist.

## 2021-07-16 NOTE — Plan of Care (Signed)

## 2021-07-17 ENCOUNTER — Encounter (HOSPITAL_COMMUNITY): Payer: Self-pay | Admitting: Urology

## 2021-07-17 DIAGNOSIS — C61 Malignant neoplasm of prostate: Secondary | ICD-10-CM | POA: Diagnosis not present

## 2021-07-17 NOTE — Progress Notes (Signed)
AVS and discharge instructions reviewed w/ patient and patient's caregiver (sister). Patient and sister verbalized understanding and had no further questions. JP drain removed prior to discharge and foley care instructions and extra supplies given to patient and sister.

## 2021-07-17 NOTE — Plan of Care (Signed)

## 2021-07-18 NOTE — Anesthesia Postprocedure Evaluation (Signed)
Anesthesia Post Note  Patient: Jeffery [name]  Procedure(s) Performed: XI ROBOTIC ASSISTED LAPAROSCOPIC RADICAL PROSTATECTOMY; CIRCUMCISION PELVIC LYMPH NODE DISSECTION (Bilateral)     Patient location during evaluation: PACU Anesthesia Type: General Level of consciousness: awake and alert Pain management: pain level controlled Vital Signs Assessment: post-procedure vital signs reviewed and stable Respiratory status: spontaneous breathing, nonlabored ventilation, respiratory function stable and patient connected to nasal cannula oxygen Cardiovascular status: blood pressure returned to baseline and stable Postop Assessment: no apparent nausea or vomiting Anesthetic complications: no   No notable events documented.  Last Vitals:  Vitals:   07/16/21 1133 07/16/21 2155  BP: 124/73 107/68  Pulse: 72 80  Resp: 16 16  Temp: 36.8 C 36.9 C  SpO2: 99% 97%    Last Pain:  Vitals:   07/17/21 1019  TempSrc:   PainSc: Asleep   Pain Goal: Patients Stated Pain Goal: 0 (07/15/21 2217)                 Deyjah Kindel

## 2021-07-18 NOTE — Op Note (Signed)
NAME: Jeffery [name], SAO MEDICAL RECORD NO: [id number] ACCOUNT NO: [id number] DATE OF BIRTH: [birth year] FACILITY: [facility]ATION: [id number] PHYSICIAN: Alexis Frock, MD  Operative Report   DATE OF PROCEDURE: 07/15/2021  PREOPERATIVE DIAGNOSES:  Prostate cancer with refractory urinary retention.  POSTOPERATIVE DIAGNOSES:  Prostate cancer with refractory urinary retention and severe paraphimosis.  PROCEDURES PERFORMED: 1.  Robotic-assisted laparoscopic radical prostatectomy. 2.  Bilateral pelvic lymphadenectomy. 3.  Circumcision.  ESTIMATED BLOOD LOSS:  409 mL  COMPLICATIONS:  None.  SPECIMENS:  1.  Right external iliac lymph nodes. 2.  Right obturator lymph nodes. 3.  Right perivesical lymph nodes, sentinel. 4.  Left external iliac lymph nodes. 3.  Left perivesical lymph nodes, sentinel. 4.  Prostatectomy.  FINDINGS:  1.  Bilateral sentinel lymph nodes denoted on pathology requisition. 2.  Severe trilobar prostatic hypertrophy as anticipated. 3.  Severe paraphimosis. 4.  Intraoperative decision made to proceed with circumcision after consulting with the patient's family.  INDICATIONS FOR PROCEDURE:  The patient is a very pleasant 66 year old man with longstanding history of developmental delay.  He requires help in most of his ADLs and lives with the family who have provided excellent care of him for decades.  His sister  Jeffery [name] is his power of attorney and helps with decision making.  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  He unfortunately  subsequently developed progressive irritative voiding symptoms and obstructive voiding symptoms, was placed on 5-alpha reductase inhibitors, but yet, he has progressed to the point of frank urinary retention.  At this point, his prostate volume actually  increased in size of 95 grams with a very large median lobe, estimated  to be approximately 50% of the gland volume. With his very obstructing anatomy and now urinary retention in the setting of prostate cancer, options were discussed including continued  surveillance with chronic catheters versus surgical extirpation versus radiation and we all agreed on surgery, prostatectomy, being most definitive as likely cure him off his cancer, but also his problematic urinary retention.  We discussed with the  patient and his family over several visits.  He presents for this today.  Informed consent was obtained and placed in medical record.  PROCEDURE IN DETAIL:  The patient as being [name] verified and procedure being radical prostatectomy and node dissection was confirmed.  Procedure timeout was performed.  Intravenous antibiotics were administered.  General endotracheal anesthesia  induced.  The patient was placed into a low lithotomy position.  Sterile field was created, prepped and draped the patient's penis, perineum, and proximal thighs using iodine and his infra-xiphoid abdomen using chlorhexidine gluconate after he was  clipper shaven and further fastened to operating table using three tape over foam padding across the supraxiphoid chest.  His arms were tucked to the side with gel rolls.  His in situ Foley catheter had been removed.  During prepping and removal of his  catheter, it became immediately apparent that the patient had a fairly severe paraphimosis.  He is uncircumcised and fortunately did not have any distal glans necrosis, but he did have significant erythema, swelling and skin breakdown along the area of  his phimotic ring. After placing the distal aspect on gentle pressure for approximately 3 minutes, this was reduced.  A new Foley catheter was placed free to straight drain.  Next a high flow, low pressure pneumoperitoneum was obtained using Veress  technique  in the supraumbilical midline having passed the aspiration and drop test.  An 8 mm robotic camera  port was placed in the location.  Laparoscopic examination of peritoneal cavity revealed no significant adhesions, no visceral injury.  Additional  ports were placed as follows:  Right paramedian 8 mm robotic port, right far lateral 12 mm AirSeal assist port, right paramedian 5 mm suction port, left paramedian 8 mm robotic port, left far lateral 8 mm robotic port.  Robot was docked, having passed  the electronic checks.  Initial attention was directed at development of space of Retzius.  Incision was made lateral to the right median umbilical ligament from the midline towards the area of the internal ring coursing along the iliac vessels towards  the area of the right ureter, which was positively identified.  The right vas deferens was encountered, purposely ligated using medial bucket handle and the right bladder wall  right pelvic sidewall towards the endopelvic fascia on the right side.  A  mirror image dissection was performed in the left side.  Anterior attachments were taken down with cautery scissors to expose the anterior base of the prostate, which was defatted to better denote the prostate, bladder neck junction.  Next, 0.2 mL IC  green dye was injected in each lobe of the prostate using a percutaneously placed robotically guided spinal needle just above the pubic ramus to allow for sentinel lymphangiography.  The endopelvic fascia was swept away from the lateral aspect of the  prostate and base to apex orientation.  This exposed the dorsal venous complex and it was carefully controlled using green load stapler and this resulted in excellent hemostatic control of the dorsal venous complex.  It had been approximately 10 minutes  post-dye injection. The pelvis was inspected under near infrared light.  Sentinel lymphangiography revealed excellent parenchymal uptake of the prostate with multiple sentinel lymph node channels coursing towards the pelvic lymph node fields bilaterally.  There was a  dominant perivesical node on each side notably. Next   lymphadenectomy was performed on the right side of the right external and right obturator grooves respectively.  There was a paucity of tissue in this location.  Lymphostasis was achieved with cold clips and set aside labeled as right external lymph  nodes.  There was a right perivesical node which was dissected free as well.  Lymphostasis was achieved with cold clips, and set aside and labeled as such.  Next, a mirror image lymphadenectomy was performed on the left side, left pelvic, left  perivesical lymph nodes sentinel as well.  Bilateral obturator nerves were inspected following these maneuvers and found to be uninjured.  Attention was directed at bladder neck dissection.  Bladder neck was identified moving the Foley catheter back and  forth and a lateral release was performed on each side to better denote the bladder neck and prostate junction.  The patient of course had a median lobe that was very large and somewhat eccentric, being right greater than left, which distorted his  bladder neck considerably.  Exquisite care was taken to separate the bladder neck from the prostate in anterior and posterior direction, keeping what appeared to be a rim of circular muscle fibers in each plane of dissection.  A large median lobe was  delivered into the operative field and two stay sutures of 0 Vicryl were placed, one on the right, one on the left of the median lobe to better manipulate it.  Given the very large intravesical protrusion, a very  careful dissection was performed in the  posterior plane that will be inferiorly for a distance of approximately 4 cm before then diving posteriorly, the plane of Denonvilliers was then entered.  Bilateral vas deferens were encountered, dissected for a distance of approximately 4 cm, ligated,  and placed on gentle superior traction.  Bilateral seminal vesicle tip was placed on gentle superior traction.  This exposed  the vascular pedicles on each side, which were controlled using a sequential clipping technique in a base to apex orientation.   Final apical dissection was performed by placing the prostate on superior traction and transecting the membranous urethra coldly.  This completely freed up the very large trilobar prostate. Specimen was placed in EndoCatch bag for later retrieval.  Next,  digital rectal exam was performed using indicator glove.  No evidence of rectal violation was noted.  Bladder neck was carefully inspected and I was quite happy with the caliber of this and this did not appear to require any formal bladder neck  reconstruction, which was quite favorable.  Posterior dissection was then performed using a 3-0 V-Loc suture reapproximating the posterior urethral plate, the posterior bladder neck, bringing the structures into tension-free apposition.  Next  mucosa-to-mucosa anastomosis was performed using double armed 3-0 V-Loc suture from the 6 o'clock, 12 o'clock positions respectively, which revealed excellent tension-free apposition of the bladder neck and membranous urethra.  A new Foley catheter was  placed, which irrigated quantitatively.  Sponge and needle counts were correct.  Hemostasis was excellent.  We achieved the goals of the extirpated portion of the procedure today.  Closed suction drain was brought out the previous left lateral most  robotic site into the peritoneal cavity.  Drain stitch was applied.  Robot was then undocked.  The previous right lateral most assistant port site was closed with fascia using Carter-Thompson suture passer and 0 Vicryl.  Next the specimen was retrieved  by extending the previous camera port site superiorly and inferiorly for a total distance of approximately 4 cm, removing the prostatectomy specimen and setting aside for permanent pathology.  Extraction site was then closed with fascia using  figure-of-eight PDS x 4 followed by reapproximation of Scarpa's  using running Vicryl.  All incision sites were infiltrated with dilute lipolyzed Marcaine and closed at the level of the skin using subcuticular Monocryl followed by Dermabond.  Attention  was then redirected to the inspection of the penis.  Again, he had impressive paraphimosis and given his mental handicap, I was quite concerned that this could happen again in the future and be unrecognized leading to more permanent injury.  I discussed  with his power of attorney, his sister, Jeffery [name] via phone about the paraphimosis and the risk of this in the future.  The recommendation of proceeding with circumcision today to prevent this from being an issue going forward and she adamantly wished to  proceed.  As such, at the 12 o'clock position, the penile skin was marked as was the proximal collar corresponding to the unstretched corona of the glans and these were developed circumferentially.  Foreskin was reduced and a distal collar was developed  circumferentially approximately 7 mm proximal to the corona glans and the redundant preputial collar was excised.  Additional point coagulation current was provided excelent hemostasis.  Stay suture was placed at 12 o'clock, 2 o'clock positions and the  resulting skin edges were reapproximated using two separate running suture lines from 12 o'clock position to the 2 o'clock position with running Vicryl  and this resulted in excellent skin apposition and excellent hemostasis and very good cosmesis.  A  dressing of Xeroform followed by a loose Kling and Coban was applied.  Procedure was terminated.  The patient tolerated the procedure well, no immediate perioperative complications.  The patient was taken to postanesthesia care in stable condition.  Plan for observation admission, likely discharge home in 1-2 days pending his clinical status.  Please note, first assistant, Debbrah Alar, was crucial for all portions of the surgery today.  She provided invaluable retraction,  suctioning, specimen manipulation, vascular clipping, lymphatic clipping, vascular stapling and general first assistance.   [facility]UK D: 07/15/2021 11:00:46 am T: 07/16/2021 12:44:00 am  JOB: [id number]/ [id number]

## 2021-07-20 LAB — SURGICAL PATHOLOGY

## 2021-07-26 NOTE — Discharge Summary (Signed)
Physician Discharge Summary  Patient ID: Jeffery [name] MRN: [id number] DOB/AGE: [birth year] 66 y.o.  Admit date: 07/15/2021 Discharge date: 07/17/2021  Admission Diagnoses:  Prostate cancer [facility])  Discharge Diagnoses:  Principal Problem:   Prostate cancer [facility])   Past Medical History:  Diagnosis Date   Cancer ([place])    PROSTATE CANCER--PROSTATE BIOPSY PLANNED EVERY 6 MONTHS--NO TX PLANNED UNLESS THE CANCER STARTS PROGRESSING-   Hypercholesteremia    under control   Mental retardation 03-20-11   Sister states pt. developed retardation symptoms about age 56,? whether drug related. Start having seizures. Hx. ETOH abuse in past. -"cognitive issues-can not live alone"   Seizures ([place])    last seizure 10/27/13  grande maul    Surgeries: Procedure(s): XI ROBOTIC ASSISTED LAPAROSCOPIC RADICAL PROSTATECTOMY; CIRCUMCISION PELVIC LYMPH NODE DISSECTION on 07/15/2021   Consultants (if any):   Discharged Condition: Improved  Hospital Course: Jeffery [name] is an 66 y.o. male who was admitted 07/15/2021 with a diagnosis of Prostate cancer ([place]) and went to the operating room on 07/15/2021 and underwent the above named procedures.    He was given perioperative antibiotics:  Anti-infectives (From admission, onward)    Start     Dose/Rate Route Frequency Ordered Stop   07/15/21 0530  ceFAZolin (ANCEF) IVPB 2g/100 mL premix        2 g 200 mL/hr over 30 Minutes Intravenous 30 min pre-op 07/15/21 0530 07/15/21 0745   07/15/21 0000  sulfamethoxazole-trimethoprim (BACTRIM DS) 800-160 MG tablet        1 tablet Oral 2 times daily 07/15/21 1055       .  He was given sequential compression devices, early ambulation, for DVT prophylaxis.  He benefited maximally from the hospital stay and there were no complications.    Recent vital signs:  Vitals:   07/16/21 1133 07/16/21 2155  BP: 124/73 107/68  Pulse: 72 80  Resp: 16 16  Temp: 98.2 F (36.8 C) 98.4 F (36.9 C)  SpO2: 99% 97%    Recent  laboratory studies:  Lab Results  Component Value Date   HGB 13.0 07/16/2021   HGB 13.6 07/15/2021   HGB 14.8 07/05/2021   Lab Results  Component Value Date   WBC 6.1 07/05/2021   PLT 200 07/05/2021   Lab Results  Component Value Date   INR 1.04 03/02/2016   Lab Results  Component Value Date   NA 141 07/16/2021   K 4.2 07/16/2021   CL 109 07/16/2021   CO2 26 07/16/2021   BUN 8 07/16/2021   CREATININE 0.88 07/16/2021   GLUCOSE 106 (H) 07/16/2021    Discharge Medications:   Allergies as of 07/17/2021   No Known Allergies      Medication List     STOP taking these medications    aspirin EC 81 MG tablet   Avodart 0.5 MG capsule Generic drug: dutasteride   cholecalciferol 25 MCG (1000 UNIT) tablet Commonly known as: VITAMIN D3   multivitamin with minerals Tabs tablet   tamsulosin 0.4 MG Caps capsule Commonly known as: FLOMAX       TAKE these medications    atorvastatin 20 MG tablet Commonly known as: LIPITOR Take 20 mg by mouth daily.   citalopram 20 MG tablet Commonly known as: CELEXA Take 20 mg by mouth daily.   divalproex 500 MG 24 hr tablet Commonly known as: DEPAKOTE ER Take 3 tablets (1,500 mg total) by mouth daily.   docusate sodium 100 MG capsule Commonly known as: COLACE Take  1 capsule (100 mg total) by mouth 2 (two) times daily.   HYDROcodone-acetaminophen 5-325 MG tablet Commonly known as: Norco Take 1-2 tablets by mouth every 6 (six) hours as needed for moderate pain or severe pain.   phenytoin 100 MG ER capsule Commonly known as: Dilantin Take 2 capsules (200 mg total) by mouth at bedtime.   phenytoin 50 MG tablet Commonly known as: DILANTIN Chew 1 tablet (50 mg total) by mouth daily. What changed: when to take this   sulfamethoxazole-trimethoprim 800-160 MG tablet Commonly known as: BACTRIM DS Take 1 tablet by mouth 2 (two) times daily. Start the day prior to foley removal appointment   tolterodine 2 MG tablet Commonly  known as: DETROL Take 2 mg by mouth 2 (two) times daily as needed (bladder spasms).        Diagnostic Studies: No results found.  Disposition: Discharge disposition: 01-Home or Self Care          Follow-up Information     Alexis Frock, MD Follow up on 07/26/2021.   Specialty: Urology Why: at 9:30 for MD visit and catheter removal Contact information: [address] [place] [address] [phone]                  Signed: Nicolette Bang 07/26/2021, 9:45 AM

## 2021-10-11 ENCOUNTER — Telehealth: Payer: Self-pay | Admitting: Adult Health

## 2021-10-11 NOTE — Telephone Encounter (Signed)
Pt has an appt 10-18-2021.  Do you want to adjust his medication?

## 2021-10-11 NOTE — Telephone Encounter (Signed)
[facility]) having worsening ataxia, free phenytoin levels 2.8, current on 250 mg nightly. Want to know to decrease to 200 mg to address phenytoin (DILANTIN) 100 MG ER capsule levels. Would like a call from the nurse.

## 2021-10-12 NOTE — Telephone Encounter (Signed)
I called [place], pt has a UTI at this time and is treating this.  She will fax over the lab results for [place] to use at his appt.  She thought ok to see at 10-18-2021 appt.  I gave her the fax # [phone] to get [place] results.  She appreicated call back.

## 2021-10-18 ENCOUNTER — Encounter: Payer: Self-pay | Admitting: Adult Health

## 2021-10-18 ENCOUNTER — Ambulatory Visit (INDEPENDENT_AMBULATORY_CARE_PROVIDER_SITE_OTHER): Payer: Medicare (Managed Care) | Admitting: Adult Health

## 2021-10-18 VITALS — BP 102/71 | HR 61 | Ht 67.0 in | Wt 136.8 lb

## 2021-10-18 DIAGNOSIS — G40909 Epilepsy, unspecified, not intractable, without status epilepticus: Secondary | ICD-10-CM | POA: Diagnosis not present

## 2021-10-18 NOTE — Progress Notes (Signed)
PATIENT: Jeffery [name] DOB: [birth year]  REASON FOR VISIT: follow up HISTORY FROM: patient  Chief Complaint  Patient presents with   Follow-up    Pt in 19 with sister   Pt has a developmental delay  Pt is having a hard time walking and unbalanced  Sister states this has been going on for a week sister  states this has happened before she thinks its his seizure medication.  Sister states no seizures for patient in the last six months      HISTORY OF PRESENT ILLNESS: Today 10/18/21:  Jeffery [name] is a 66 year old male with a history of mental retardation and seizures.  He returns today for follow-up.  He is here today with his sister.  She reports that over the last week he has had a hard time walking and his balance she feels that this is related to his seizure medication.  He also had a urinary tract infection today is his last day of antibiotic.  She has not noticed significant improvement in his gait since he started the antibiotic.  the patient has not had any seizure events in the last 6 months.  Currently on Dilantin 250 mg daily and Depakote 1500 mg daily.  Dilantin level was checked that patient was in normal range  11/02/2020 Jeffery [name] is a 66 year old male with a history of mental retardation and seizures.  He returns today for follow-up.  The patient had a breakthrough seizure about a week and a half ago.  His sister states that he did miss a dose of his medication.  He was taken Dilantin 200 mg.  His dose of Dilantin have been reduced from 250-200 due to increased blood level.  Dilantin has been increased back to 250 mg daily.  The patient has not had any seizures since.  07/07/20: Jeffery [name] is a 66 year old male with a history of mental retardation and seizures.  He is here today with his sister.  She reports that over the last year he may have had 6 seizures.  She states that he typically presents with shaking and his whole entire body gets stiff.  She does not recall when the last  seizure was.  [facility].  Reports that his Dilantin level was recently increased to 250 mg at bedtime due to low levels.  He remains on Depakote 1500 mg daily.  She returns today for an evaluation.  07/07/19: Jeffery [name] is a 66 year old male with a history of mental retardation and seizures.  He returns today for follow-up.  He is here today with his sister.  He continues to live with her.  He remains on Depakote and Dilantin.  He is a part of [facility].  Denies any seizure events.  Returns today for an evaluation.  HISTORY 01/01/19:   Jeffery [name] is a 66 year old male with a history of mental retardation and seizures.  He returns today for follow-up.  He is currently taking Depakote 1500 mg daily and Dilantin 250 mg daily.  He is here today with his caregiver.  They deny any seizure events.  He did go to the emergency room in November for altered mental status but work-up was relatively unremarkable.  He lives with his sister and brother-in-law.  His brother-in-law reports that he is able to complete most ADLs independently.  They help him with his medication and food.  He returns today for evaluation  REVIEW OF SYSTEMS: Out of a complete 14 system  review of symptoms, the patient complains only of the following symptoms, and all other reviewed systems are negative.  See HPI  ALLERGIES: No Known Allergies  HOME MEDICATIONS: Outpatient Medications Prior to Visit  Medication Sig Dispense Refill   ASPIRIN PO Take by mouth.     atorvastatin (LIPITOR) 20 MG tablet Take 20 mg by mouth daily.     citalopram (CELEXA) 20 MG tablet Take 20 mg by mouth daily.     divalproex (DEPAKOTE ER) 500 MG 24 hr tablet Take 3 tablets (1,500 mg total) by mouth daily. 90 tablet 4   docusate sodium (COLACE) 100 MG capsule Take 1 capsule (100 mg total) by mouth 2 (two) times daily.     phenytoin (DILANTIN) 100 MG ER capsule Take 2 capsules (200 mg total) by mouth at bedtime. 180 capsule 3    phenytoin (DILANTIN) 50 MG tablet Chew 1 tablet (50 mg total) by mouth daily. (Patient taking differently: Chew 50 mg by mouth at bedtime.) 90 tablet 3   VITAMIN E PO Take by mouth.     HYDROcodone-acetaminophen (NORCO) 5-325 MG tablet Take 1-2 tablets by mouth every 6 (six) hours as needed for moderate pain or severe pain. 20 tablet 0   sulfamethoxazole-trimethoprim (BACTRIM DS) 800-160 MG tablet Take 1 tablet by mouth 2 (two) times daily. Start the day prior to foley removal appointment 6 tablet 0   tolterodine (DETROL) 2 MG tablet Take 2 mg by mouth 2 (two) times daily as needed (bladder spasms).     No facility-administered medications prior to visit.    PAST MEDICAL HISTORY: Past Medical History:  Diagnosis Date   Cancer ([place])    PROSTATE CANCER--PROSTATE BIOPSY PLANNED EVERY 6 MONTHS--NO TX PLANNED UNLESS THE CANCER STARTS PROGRESSING-   Hypercholesteremia    under control   Mental retardation 03-20-11   Sister states pt. developed retardation symptoms about age 66,? whether drug related. Start having seizures. Hx. ETOH abuse in past. -"cognitive issues-can not live alone"   Seizures ([place])    last seizure 10/27/13  grande maul    PAST SURGICAL HISTORY: Past Surgical History:  Procedure Laterality Date   LYMPH NODE DISSECTION Bilateral 07/15/2021   Procedure: PELVIC LYMPH NODE DISSECTION;  Surgeon: Alexis Frock, MD;  Location: [facility];  Service: Urology;  Laterality: Bilateral;   PROSTATE BIOPSY  03/27/2011   Procedure: BIOPSY TRANSRECTAL ULTRASONIC PROSTATE (TUBP);  Surgeon: Molli Hazard, MD;  Location: [facility];  Service: Urology;  Laterality: N/A;  Prostatic Block        PROSTATE BIOPSY  09/27/2011   Procedure: BIOPSY TRANSRECTAL ULTRASONIC PROSTATE (TUBP);  Surgeon: Molli Hazard, MD;  [facility];  Service: Urology;  Laterality: N/A;        PROSTATE BIOPSY N/A 10/29/2013   Procedure: BIOPSY TRANSRECTAL ULTRASONIC PROSTATE (TUBP);  Surgeon: Alexis Frock, MD;  [facility];  Service: Urology;  Laterality: N/A;   ROBOT ASSISTED LAPAROSCOPIC RADICAL PROSTATECTOMY N/A 07/15/2021   Procedure: XI ROBOTIC ASSISTED LAPAROSCOPIC RADICAL PROSTATECTOMY; CIRCUMCISION;  Surgeon: Alexis Frock, MD;  [facility]: [facility];  Service: Urology;  Laterality: N/A;    FAMILY HISTORY: Family History  Problem Relation Age of Onset   Cancer Sister        breast   Hypertension Sister    Diabetes Sister    Hypertension Sister    Seizures Neg Hx     SOCIAL HISTORY: Social History   Socioeconomic History   Marital status: Single    Spouse name:  Not on file   Number of children: 0   Years of education: Not on file   Highest education level: Not on file  Occupational History   Occupation: Disability   Tobacco Use   Smoking status: Former    Packs/day: 0.50    Years: 10.00    Total pack years: 5.00    Types: Cigarettes   Smokeless tobacco: Never   Tobacco comments:    SNEAKS A SMOKE EVERY NOW AND THEN  Vaping Use   Vaping Use: Never used  Substance and Sexual Activity   Alcohol use: No    Comment: none in 15 yrs,past ETOH abuse Sneaks liquor once every few months   Drug use: No   Sexual activity: Never  Other Topics Concern   Not on file  Social History Narrative   Patient is single and lives with his sister    Patient does not have any children.   Patient is right-handed.   Rarely drinks caffeine, avoids at home    Goes to adult day care three days per week   Social Determinants of Health   Financial Resource Strain: Not on file  Food Insecurity: Not on file  Transportation Needs: Not on file  Physical Activity: Not on file  Stress: Not on file  Social Connections: Not on file  Intimate Partner Violence: Not on file      PHYSICAL EXAM  Vitals:   10/18/21 1458  BP: 102/71  Pulse: 61  Weight: 136 lb 12.8 oz (62.1 kg)  Height: '5\' 7"'$  (1.702 m)   Body mass index is 21.43 kg/m.  Generalized: Well developed, in no acute  distress   Neurological examination  Mentation: Alert. Follows all commands.  Nonverbal Cranial nerve II-XII: Pupils were equal round reactive to light. Extraocular movements were full, visual field were full on confrontational test.. Head turning and shoulder shrug  were normal and symmetric. Motor: The motor testing reveals 5 over 5 strength of all 4 extremities. Good symmetric motor tone is noted throughout.  Sensory: Sensory testing is intact to soft touch on all 4 extremities. No evidence of extinction is noted.  Coordination: Cerebellar testing reveals good finger-nose-finger and heel-to-shin bilaterally.  Gait and station: Gait is unsteady.  DIAGNOSTIC DATA (LABS, IMAGING, TESTING) - I reviewed patient records, labs, notes, testing and imaging myself where available.  Lab Results  Component Value Date   WBC 6.1 07/05/2021   HGB 13.0 07/16/2021   HCT 40.0 07/16/2021   MCV 100.4 (H) 07/05/2021   PLT 200 07/05/2021      Component Value Date/Time   NA 141 07/16/2021 0455   NA 144 07/07/2020 1449   K 4.2 07/16/2021 0455   CL 109 07/16/2021 0455   CO2 26 07/16/2021 0455   GLUCOSE 106 (H) 07/16/2021 0455   BUN 8 07/16/2021 0455   BUN 18 07/07/2020 1449   CREATININE 0.88 07/16/2021 0455   CREATININE 0.83 01/27/2016 1515   CALCIUM 9.0 07/16/2021 0455   PROT 7.1 07/07/2020 1449   ALBUMIN 4.5 07/07/2020 1449   AST 17 07/07/2020 1449   ALT 16 07/07/2020 1449   ALKPHOS 108 07/07/2020 1449   BILITOT <0.2 07/07/2020 1449   GFRNONAA >60 07/16/2021 0455   GFRNONAA >89 01/27/2016 1515   GFRAA >60 12/10/2018 1139   GFRAA >89 01/27/2016 1515   Lab Results  Component Value Date   CHOL 230 (H) 08/17/2011   HDL 80 08/17/2011   LDLCALC 137 (H) 08/17/2011   LDLDIRECT 101 04/26/2015  TRIG 66 08/17/2011   CHOLHDL 2.9 08/17/2011    ASSESSMENT AND PLAN 66 y.o. year old male  has a past medical history of Cancer ([place]), Hypercholesteremia, Mental retardation (03-20-11), and Seizures  ([place]). here with:  Seizures  Decrease Dilantin 200 mg at bedtime.  Monitor for breakthrough seizure events.  May consider weaning off of Dilantin if gait continues to be an issue Continue Depakote 1500 mg daily at bedtime Advised if he has any seizure events she should let us know Follow-up in 6 months or sooner if needed   Ward Givens, MSN, NP-C 10/18/2021, 3:10 PM [facility] [address], [place], [place] [address] [phone]

## 2022-03-27 ENCOUNTER — Telehealth: Payer: Self-pay | Admitting: *Deleted

## 2022-03-27 NOTE — Telephone Encounter (Signed)
Received lab results from [facility].   Abnormals:  Glucose 111H Chloride 108H BUN/Creat 28H  Placed in inbox.

## 2022-04-07 ENCOUNTER — Emergency Department (HOSPITAL_COMMUNITY): Payer: Medicare (Managed Care)

## 2022-04-07 ENCOUNTER — Inpatient Hospital Stay (HOSPITAL_COMMUNITY)
Admission: EM | Admit: 2022-04-07 | Discharge: 2022-04-10 | DRG: 690 | Disposition: A | Discharge status: Home / Self Care | Payer: Medicare (Managed Care) | Attending: Internal Medicine | Admitting: Internal Medicine

## 2022-04-07 ENCOUNTER — Other Ambulatory Visit: Payer: Self-pay

## 2022-04-07 ENCOUNTER — Encounter (HOSPITAL_COMMUNITY): Payer: Self-pay

## 2022-04-07 DIAGNOSIS — S0031XA Abrasion of nose, initial encounter: Secondary | ICD-10-CM | POA: Diagnosis present

## 2022-04-07 DIAGNOSIS — D696 Thrombocytopenia, unspecified: Secondary | ICD-10-CM | POA: Diagnosis present

## 2022-04-07 DIAGNOSIS — R4689 Other symptoms and signs involving appearance and behavior: Secondary | ICD-10-CM

## 2022-04-07 DIAGNOSIS — Z87440 Personal history of urinary (tract) infections: Secondary | ICD-10-CM

## 2022-04-07 DIAGNOSIS — R030 Elevated blood-pressure reading, without diagnosis of hypertension: Secondary | ICD-10-CM | POA: Diagnosis present

## 2022-04-07 DIAGNOSIS — G40909 Epilepsy, unspecified, not intractable, without status epilepticus: Secondary | ICD-10-CM | POA: Diagnosis not present

## 2022-04-07 DIAGNOSIS — Z8546 Personal history of malignant neoplasm of prostate: Secondary | ICD-10-CM

## 2022-04-07 DIAGNOSIS — G9340 Encephalopathy, unspecified: Secondary | ICD-10-CM | POA: Diagnosis present

## 2022-04-07 DIAGNOSIS — Z7982 Long term (current) use of aspirin: Secondary | ICD-10-CM

## 2022-04-07 DIAGNOSIS — E7800 Pure hypercholesterolemia, unspecified: Secondary | ICD-10-CM | POA: Diagnosis present

## 2022-04-07 DIAGNOSIS — Z79899 Other long term (current) drug therapy: Secondary | ICD-10-CM

## 2022-04-07 DIAGNOSIS — N3000 Acute cystitis without hematuria: Secondary | ICD-10-CM | POA: Diagnosis not present

## 2022-04-07 DIAGNOSIS — Z781 Physical restraint status: Secondary | ICD-10-CM

## 2022-04-07 DIAGNOSIS — F79 Unspecified intellectual disabilities: Secondary | ICD-10-CM | POA: Diagnosis present

## 2022-04-07 DIAGNOSIS — N390 Urinary tract infection, site not specified: Principal | ICD-10-CM | POA: Diagnosis present

## 2022-04-07 DIAGNOSIS — S0081XA Abrasion of other part of head, initial encounter: Secondary | ICD-10-CM | POA: Diagnosis present

## 2022-04-07 DIAGNOSIS — Z8249 Family history of ischemic heart disease and other diseases of the circulatory system: Secondary | ICD-10-CM

## 2022-04-07 DIAGNOSIS — R4182 Altered mental status, unspecified: Secondary | ICD-10-CM | POA: Diagnosis present

## 2022-04-07 DIAGNOSIS — R410 Disorientation, unspecified: Principal | ICD-10-CM

## 2022-04-07 DIAGNOSIS — W19XXXA Unspecified fall, initial encounter: Secondary | ICD-10-CM | POA: Diagnosis present

## 2022-04-07 DIAGNOSIS — Z87891 Personal history of nicotine dependence: Secondary | ICD-10-CM

## 2022-04-07 LAB — COMPREHENSIVE METABOLIC PANEL
ALT: 25 U/L (ref 0–44)
AST: 29 U/L (ref 15–41)
Albumin: 3.8 g/dL (ref 3.5–5.0)
Alkaline Phosphatase: 77 U/L (ref 38–126)
Anion gap: 9 (ref 5–15)
BUN: 18 mg/dL (ref 8–23)
CO2: 24 mmol/L (ref 22–32)
Calcium: 9.1 mg/dL (ref 8.9–10.3)
Chloride: 105 mmol/L (ref 98–111)
Creatinine, Ser: 0.85 mg/dL (ref 0.61–1.24)
GFR, Estimated: >60 mL/min (ref >60)
Glucose, Bld: 84 mg/dL (ref 70–99)
Potassium: 3.9 mmol/L (ref 3.5–5.1)
Sodium: 138 mmol/L (ref 135–145)
Total Bilirubin: 0.6 mg/dL (ref 0.3–1.2)
Total Protein: 7.5 g/dL (ref 6.5–8.1)

## 2022-04-07 LAB — URINALYSIS, ROUTINE W REFLEX MICROSCOPIC
Bilirubin Urine: NEGATIVE
Glucose, UA: NEGATIVE mg/dL
Hgb urine dipstick: NEGATIVE
Ketones, ur: NEGATIVE mg/dL
Nitrite: POSITIVE — AB
Protein, ur: NEGATIVE mg/dL
Specific Gravity, Urine: 1.01 (ref 1.005–1.030)
pH: 7 (ref 5.0–8.0)

## 2022-04-07 LAB — CBC WITH DIFFERENTIAL/PLATELET
Abs Immature Granulocytes: 0.02 10*3/uL (ref 0.00–0.07)
Basophils Absolute: 0 10*3/uL (ref 0.0–0.1)
Basophils Relative: 0 %
Eosinophils Absolute: 0.1 10*3/uL (ref 0.0–0.5)
Eosinophils Relative: 1 %
HCT: 45.7 % (ref 39.0–52.0)
Hemoglobin: 14.6 g/dL (ref 13.0–17.0)
Immature Granulocytes: 0 %
Lymphocytes Relative: 29 %
Lymphs Abs: 2 10*3/uL (ref 0.7–4.0)
MCH: 31.6 pg (ref 26.0–34.0)
MCHC: 31.9 g/dL (ref 30.0–36.0)
MCV: 98.9 fL (ref 80.0–100.0)
Monocytes Absolute: 0.6 10*3/uL (ref 0.1–1.0)
Monocytes Relative: 8 %
Neutro Abs: 4.2 10*3/uL (ref 1.7–7.7)
Neutrophils Relative %: 62 %
Platelets: 119 10*3/uL — ABNORMAL LOW (ref 150–400)
RBC: 4.62 MIL/uL (ref 4.22–5.81)
RDW: 12.8 % (ref 11.5–15.5)
WBC: 6.9 10*3/uL (ref 4.0–10.5)
nRBC: 0 % (ref 0.0–0.2)

## 2022-04-07 LAB — LACTIC ACID, PLASMA: Lactic Acid, Venous: 1.3 mmol/L (ref 0.5–1.9)

## 2022-04-07 LAB — VALPROIC ACID LEVEL: Valproic Acid Lvl: 40 ug/mL — ABNORMAL LOW (ref 50.0–100.0)

## 2022-04-07 LAB — PROTIME-INR
INR: 1.1 (ref 0.8–1.2)
Prothrombin Time: 14.3 seconds (ref 11.4–15.2)

## 2022-04-07 LAB — ETHANOL: Alcohol, Ethyl (B): <10 mg/dL (ref <10)

## 2022-04-07 LAB — PHENYTOIN LEVEL, TOTAL: Phenytoin Lvl: 7.9 ug/mL — ABNORMAL LOW (ref 10.0–20.0)

## 2022-04-07 MED ORDER — ASPIRIN 81 MG PO CHEW
81.0000 mg | CHEWABLE_TABLET | Freq: Every day | ORAL | Status: DC
  Administered 2022-04-08 – 2022-04-10 (×3): 81 mg via ORAL
  Filled 2022-04-07 (×3): qty 1

## 2022-04-07 MED ORDER — HALOPERIDOL LACTATE 5 MG/ML IJ SOLN
2.0000 mg | Freq: Once | INTRAMUSCULAR | Status: AC
  Administered 2022-04-07: 2 mg via INTRAVENOUS
  Filled 2022-04-07: qty 1

## 2022-04-07 MED ORDER — SODIUM CHLORIDE 0.9 % IV SOLN
250.0000 mg | Freq: Every day | INTRAVENOUS | Status: DC
  Administered 2022-04-07: 250 mg via INTRAVENOUS
  Filled 2022-04-07 (×2): qty 5

## 2022-04-07 MED ORDER — MIDAZOLAM HCL 2 MG/2ML IJ SOLN
2.0000 mg | Freq: Once | INTRAMUSCULAR | Status: AC
  Administered 2022-04-07: 2 mg via INTRAVENOUS
  Filled 2022-04-07: qty 2

## 2022-04-07 MED ORDER — ENOXAPARIN SODIUM 40 MG/0.4ML IJ SOSY
40.0000 mg | PREFILLED_SYRINGE | INTRAMUSCULAR | Status: DC
  Administered 2022-04-07 – 2022-04-09 (×3): 40 mg via SUBCUTANEOUS
  Filled 2022-04-07 (×3): qty 0.4

## 2022-04-07 MED ORDER — LORAZEPAM 2 MG/ML IJ SOLN: 0.5000 mg | INTRAMUSCULAR | Status: DC | PRN

## 2022-04-07 MED ORDER — SODIUM CHLORIDE 0.9 % IV SOLN
1.0000 g | INTRAVENOUS | Status: DC
  Administered 2022-04-08 – 2022-04-09 (×2): 1 g via INTRAVENOUS
  Filled 2022-04-07 (×3): qty 10

## 2022-04-07 MED ORDER — CITALOPRAM HYDROBROMIDE 20 MG PO TABS
20.0000 mg | ORAL_TABLET | Freq: Every day | ORAL | Status: DC
  Administered 2022-04-08 – 2022-04-10 (×3): 20 mg via ORAL
  Filled 2022-04-07 (×3): qty 1

## 2022-04-07 MED ORDER — VALPROATE SODIUM 100 MG/ML IV SOLN
375.0000 mg | Freq: Four times a day (QID) | INTRAVENOUS | Status: DC
  Administered 2022-04-07 – 2022-04-08 (×2): 375 mg via INTRAVENOUS
  Filled 2022-04-07 (×7): qty 3.75

## 2022-04-07 MED ORDER — SODIUM CHLORIDE 0.9 % IV SOLN
1.0000 g | Freq: Once | INTRAVENOUS | Status: AC
  Administered 2022-04-07: 1 g via INTRAVENOUS
  Filled 2022-04-07: qty 10

## 2022-04-07 NOTE — Assessment & Plan Note (Addendum)
-  will convert phenytoin and Depakote to IV formulation until he is able to cooperate with oral intake -no recent seizures per sister who is caretaker

## 2022-04-07 NOTE — Assessment & Plan Note (Addendum)
-  pt was combative and agitated in the field and here  -baseline he requires some assistance with ADLs such as bathing and medications but otherwise can care for himself. Has difficulty carry on full conversations due to his intellectual disability but otherwise knows his name and location.  -has received multiple doses of Valium in the field and in ED -given additional Haldo 2mg  IV for further agitation once he was transferred from ED to room -will have PRN ativan IV x 2 doses -sitter to observe but we may not have adequate staffing for that overnight -currently in soft restraints and mittens -pt did not cooperate with CT head ordered in ED but suspect this is metabolic from UTI. Will continue to monitor clinically.

## 2022-04-07 NOTE — ED Triage Notes (Addendum)
Pt arrives from [facility] with a suspected UTI. Pt was agitated and aggressive on scene, 5 mg of midazolam, 500 cc bolus NS given PTA, pt placed in soft restraints. 124/96 BP 98 HR 93% r/a 97.6 temp

## 2022-04-07 NOTE — ED Notes (Signed)
Pt would not lie still for CT scan, brought back to room. Provider notified

## 2022-04-07 NOTE — ED Notes (Signed)
Pt placed on non violent restraints. Pt placed on bed alarm, grippy socks, and mittens.

## 2022-04-07 NOTE — ED Notes (Signed)
Pt pulling at IV and will not allow staff to place monitoring equipment. Soft non-violent restraints placed on bilateral wrists and ankles

## 2022-04-07 NOTE — H&P (Signed)
History and Physical    Patient: Jeffery [name] [id number] DOB: 10-Jan-1956 DOA: 04/07/2022 DOS: the patient was seen and examined on 04/07/2022 PCP: Concepcion Living, MD  Patient coming from: Home  Chief Complaint:  Chief Complaint  Patient presents with   Agitation   HPI: Jeffery [name] is a 67 y.o. male with medical history significant of cognitive and intellectual delay, hx of prostate cancer s/p radial prostatectomy (06/2021), seizure on phenytoin and depakote who presents with agitation and aggression.   History obtained from sister over the phone as pt was uncooperative and not answering questions.  Sister is his primary care giver but they also receive help from [place]. Think he was more weak about 2 days and had a harder getting out of bed. Today was very agitated and swinging at people. Usually he gets more agitated when he gets UTI although today was more physical then usual with his aggression. He gets UTI frequently about every 3 months. He is being followed by urology for this and his hx of prostate cancer.   In the field with EMS, he was agitated and given 5mg  of midazolam, 500cc bolus and placed in soft restraints.   UA was positive for nitrate, trace leukocytes and few bacteria.   No leukocytosis or anemia. Thrombocytopenia with platelet of 119.  CMP unremarkable.   Valproic acid level was low at 40. Phenytoin level pending.   Pt was started on Rocephin and given additional Versed in ED for agitation.   Review of Systems: Unable to review all systems due to lack of cooperation from patient. Past Medical History:  Diagnosis Date   Cancer ([place])    PROSTATE CANCER--PROSTATE BIOPSY PLANNED EVERY 6 MONTHS--NO TX PLANNED UNLESS THE CANCER STARTS PROGRESSING-   Hypercholesteremia    under control   Mental retardation 03-20-11   Sister states pt. developed retardation symptoms about age 67,? whether drug related. Start having seizures. Hx. ETOH abuse in past.  -"cognitive issues-can not live alone"   Seizures ([place])    last seizure 10/27/13  grande maul   Past Surgical History:  Procedure Laterality Date   LYMPH NODE DISSECTION Bilateral 07/15/2021   Procedure: PELVIC LYMPH NODE DISSECTION;  Surgeon: Alexis Frock, MD;  [facility]: [facility];  Service: Urology;  Laterality: Bilateral;   PROSTATE BIOPSY  03/27/2011   Procedure: BIOPSY TRANSRECTAL ULTRASONIC PROSTATE (TUBP);  Surgeon: Molli Hazard, MD;  [facility]: [facility];  Service: Urology;  Laterality: N/A;  Prostatic Block        PROSTATE BIOPSY  09/27/2011   Procedure: BIOPSY TRANSRECTAL ULTRASONIC PROSTATE (TUBP);  Surgeon: Molli Hazard, MD;  [facility];  Service: Urology;  Laterality: N/A;        PROSTATE BIOPSY N/A 10/29/2013   Procedure: BIOPSY TRANSRECTAL ULTRASONIC PROSTATE (TUBP);  Surgeon: Alexis Frock, MD;  [facility];  Service: Urology;  Laterality: N/A;   ROBOT ASSISTED LAPAROSCOPIC RADICAL PROSTATECTOMY N/A 07/15/2021   Procedure: XI ROBOTIC ASSISTED LAPAROSCOPIC RADICAL PROSTATECTOMY; CIRCUMCISION;  Surgeon: Alexis Frock, MD;  [facility];  Service: Urology;  Laterality: N/A;   Social History:  reports that he has quit smoking. His smoking use included cigarettes. He has a 5.00 pack-year smoking history. He has never used smokeless tobacco. He reports that he does not drink alcohol and does not use drugs.  No Known Allergies  Family History  Problem Relation Age of Onset   Cancer Sister        breast   Hypertension  Sister    Diabetes Sister    Hypertension Sister    Seizures Neg Hx     Prior to Admission medications   Medication Sig Start Date End Date Taking? Authorizing Provider  ASPIRIN PO Take by mouth.    [provider]  atorvastatin (LIPITOR) 20 MG tablet Take 20 mg by mouth daily.    [provider]  citalopram (CELEXA) 20 MG tablet Take 20 mg by mouth daily.    [provider]  divalproex (DEPAKOTE  ER) 500 MG 24 hr tablet Take 3 tablets (1,500 mg total) by mouth daily. 06/27/18   Dohmeier, Asencion Partridge, MD  docusate sodium (COLACE) 100 MG capsule Take 1 capsule (100 mg total) by mouth 2 (two) times daily. 07/15/21   Debbrah Alar, PA-C  HYDROcodone-acetaminophen (NORCO) 5-325 MG tablet Take 1-2 tablets by mouth every 6 (six) hours as needed for moderate pain or severe pain. 07/15/21   Debbrah Alar, PA-C  phenytoin (DILANTIN) 100 MG ER capsule Take 2 capsules (200 mg total) by mouth at bedtime. 06/27/18   Dohmeier, Asencion Partridge, MD  phenytoin (DILANTIN) 50 MG tablet Chew 1 tablet (50 mg total) by mouth daily. Patient taking differently: Chew 50 mg by mouth at bedtime. 06/27/18   Dohmeier, Asencion Partridge, MD  sulfamethoxazole-trimethoprim (BACTRIM DS) 800-160 MG tablet Take 1 tablet by mouth 2 (two) times daily. Start the day prior to foley removal appointment 07/15/21   Debbrah Alar, PA-C  tolterodine (DETROL) 2 MG tablet Take 2 mg by mouth 2 (two) times daily as needed (bladder spasms).    [provider]  VITAMIN E PO Take by mouth.    [provider]    Physical Exam: Vitals:   04/07/22 1815 04/07/22 1829 04/07/22 2029 04/07/22 2100  BP: (!) 137/93  (!) 159/83   Pulse: 87  95   Resp: 18  16   Temp:  97.8 F (36.6 C)    TempSrc:      SpO2: 97%  92%   Weight:    62.1 kg  Height:    5\' 7"  (1.702 m)   Constitutional: NAD,non-toxic appearing, calm initially laying in bed with soft restraint. Became more resistant and restless when exam was attempted.  Eyes: lids and conjunctivae normal ENMT: Mucous membranes are moist.  Neck: normal Respiratory: clear to auscultation bilaterally with anterior auscultation, no wheezing, no crackles. Normal respiratory effort. No accessory muscle use.  Cardiovascular: Regular rate and rhythm, no murmurs / rubs / gallops. No extremity edema.  GU: external catheter in place Abdomen: soft, no grimace with palpation. Musculoskeletal: no clubbing / cyanosis. No  joint deformity upper and lower extremities. Normal muscle tone.  Skin: no rashes, lesions, ulcers.  Neurologic: CN 2-12 grossly intact. Pt mostly non-verbal and did not answer questions. Moves away and becomes restless with exam.  Psychiatric: Confused mood.  Data Reviewed:  See HPI  Assessment and Plan: * AMS (altered mental status) -pt was combative and agitated in the field and here  -baseline he requires some assistance with ADLs such as bathing and medications but otherwise can care for himself. Has difficulty carry on full conversations due to his intellectual disability but otherwise knows his name and location.  -has received multiple doses of Valium in the field and in ED -given additional Haldo 2mg  IV for further agitation once he was transferred from ED to room -will have PRN ativan IV x 2 doses -sitter to observe but we may not have adequate staffing for that overnight -currently  in soft restraints and mittens -pt did not cooperate with CT head ordered in ED but suspect this is metabolic from UTI. Will continue to monitor clinically.  UTI (urinary tract infection) -Continue IV Rocephin pending urine culture  Seizure disorder ([place]) -will convert phenytoin and Depakote to IV formulation until he is able to cooperate with oral intake -no recent seizures per sister who is caretaker      Advance Care Planning:   Code Status: Full Code   Consults: none  Family Communication: sister over the phone  Severity of Illness: The appropriate patient status for this patient is OBSERVATION. Observation status is judged to be reasonable and necessary in order to provide the required intensity of service to ensure the patient's safety. The patient's presenting symptoms, physical exam findings, and initial radiographic and laboratory data in the context of their medical condition is felt to place them at decreased risk for further clinical deterioration. Furthermore, it is anticipated  that the patient will be medically stable for discharge from the hospital within 2 midnights of admission.   Author: Orene Desanctis, DO 04/07/2022 10:12 PM  For on call review [email or web address].

## 2022-04-07 NOTE — ED Notes (Signed)
Pt became agitated again when attempting to give IV medication- pt yelling at his sister and kicking. Bilateral ankle restraints reapplied at this time

## 2022-04-07 NOTE — ED Notes (Signed)
Restraints removed at this time

## 2022-04-07 NOTE — ED Provider Notes (Signed)
Jeffery Provider Note   CSN: [id number] Arrival date & time: 04/07/22  1357     History  Chief Complaint  Patient presents with   Agitation    Gegory Baldassari is a 67 y.o. male with history of mental [name], Jeffery [name], [facility] [name], [name], but lives at home with his sister who is his primary caregiver.  She states that starting last night the patient was more agitated Jeffery aggressive, Jeffery not acting like himself.  They were concerned for urinary tract infection, as he has had similar symptoms with UTI in the past.  EMS on scene had to give the patient 5 mg of midazolam, Jeffery he was initially placed in soft restraints upon arrival to the ER due to altered mental status Jeffery being combative.  Sister is also concerned the patient may have fallen at some point as he had an abrasion on his forehead Jeffery his nose.  Patient refuses answer questions about this.   Level 5 caveat due to altered mental status  HPI     Home Medications Prior to Admission medications   Medication Sig Start Date End Date Taking? Authorizing Provider  ASPIRIN PO Take by mouth.    [provider]  atorvastatin (LIPITOR) 20 MG tablet Take 20 mg by mouth daily.    [provider]  citalopram (CELEXA) 20 MG tablet Take 20 mg by mouth daily.    [provider]  divalproex (DEPAKOTE ER) 500 MG 24 hr tablet Take 3 tablets (1,500 mg total) by mouth daily. 06/27/18   Dohmeier, Asencion Partridge, MD  docusate sodium (COLACE) 100 MG capsule Take 1 capsule (100 mg total) by mouth 2 (two) times daily. 07/15/21   Debbrah Alar, PA-C  HYDROcodone-acetaminophen (NORCO) 5-325 MG tablet Take 1-2 tablets by mouth every 6 (six) hours as needed for moderate pain or severe pain. 07/15/21   Debbrah Alar, PA-C  phenytoin (DILANTIN) 100 MG ER  capsule Take 2 capsules (200 mg total) by mouth at bedtime. 06/27/18   Dohmeier, Asencion Partridge, MD  phenytoin (DILANTIN) 50 MG tablet Chew 1 tablet (50 mg total) by mouth daily. Patient taking differently: Chew 50 mg by mouth at bedtime. 06/27/18   Dohmeier, Asencion Partridge, MD  sulfamethoxazole-trimethoprim (BACTRIM DS) 800-160 MG tablet Take 1 tablet by mouth 2 (two) times daily. Start the day prior to foley removal appointment 07/15/21   Debbrah Alar, PA-C  tolterodine (DETROL) 2 MG tablet Take 2 mg by mouth 2 (two) times daily as needed (bladder spasms).    [provider]  VITAMIN E PO Take by mouth.    [provider]      Allergies    Patient has no known allergies.    Review of Systems   Review of Systems  Unable to perform ROS: Mental status change    Physical Exam Updated Vital Signs BP (!) 137/93   Pulse 87   Temp 97.8 F (36.6 C)   Resp 18   SpO2 97%  Physical Exam Vitals Jeffery nursing note reviewed.  Constitutional:      Appearance: Normal appearance.     Comments: Pt in soft restraints  HENT:     Head: Normocephalic.     Comments: Facial abrasion noted just above the left eyebrow, Jeffery bridge of the nose on the right.  No cranial  deformity palpated. Eyes:     Conjunctiva/sclera: Conjunctivae normal.  Cardiovascular:     Rate Jeffery Rhythm: Normal rate Jeffery regular rhythm.  Pulmonary:     Effort: Pulmonary effort is normal. No respiratory distress.     Breath sounds: Normal breath sounds.  Abdominal:     General: There is no distension.     Palpations: Abdomen is soft.     Tenderness: There is no abdominal tenderness.  Skin:    General: Skin is warm Jeffery dry.  Neurological:     General: No focal deficit present.     Mental Status: He is alert. He is confused.     Comments: Patient can tell me his name, but refuses to answer any more of my questions.  Confused per sister, she states he normally can carry on somewhat of a conversation.  Psychiatric:        Mood  Jeffery Affect: Affect is angry.        Speech: Speech is slurred.        Behavior: Behavior is agitated Jeffery combative.    ED Results / Procedures / Treatments   Labs (all labs ordered are listed, but only abnormal results are displayed) Labs Reviewed  CBC WITH DIFFERENTIAL/PLATELET - Abnormal; Notable for the following components:      Result Value   Platelets 119 (*)    All other components within normal limits  URINALYSIS, ROUTINE W REFLEX MICROSCOPIC - Abnormal; Notable for the following components:   Nitrite POSITIVE (*)    Leukocytes,Ua TRACE (*)    Bacteria, UA FEW (*)    All other components within normal limits  URINE CULTURE  COMPREHENSIVE METABOLIC PANEL  LACTIC ACID, PLASMA  PROTIME-INR  VALPROIC ACID LEVEL  PHENYTOIN LEVEL, TOTAL  ETHANOL    EKG EKG Interpretation  Date/Time:  Friday April 07 2022 14:26:34 EDT Ventricular Rate:  90 PR Interval:  138 QRS Duration: 73 QT Interval:  346 QTC Calculation: 424 R Axis:   59 Text Interpretation: Sinus rhythm Probable left atrial enlargement No significant change since last tracing Confirmed by Blanchie Dessert [id number]) on 04/07/2022 2:56:02 PM  Radiology DG Chest Port 1 View  Result Date: 04/07/2022 CLINICAL DATA:  UTI.  Agitated EXAM: PORTABLE CHEST 1 VIEW COMPARISON:  X-ray 11/26/2019 Jeffery older FINDINGS: Film is slightly rotated to the right. No consolidation, pneumothorax, effusion or edema. Normal cardiopericardial silhouette. Overlapping cardiac leads. Mild degenerative changes along the spine. IMPRESSION: No acute cardiopulmonary disease Electronically Signed   By: Jill Side M.D.   On: 04/07/2022 15:28    Procedures Procedures    Medications Ordered in ED Medications  midazolam (VERSED) injection 2 mg (2 mg Intravenous Given 04/07/22 1505)  cefTRIAXone (ROCEPHIN) 1 g in sodium chloride 0.9 % 100 mL IVPB (0 g Intravenous Stopped 04/07/22 1721)    ED Course/ Medical Decision Making/ A&P                              Medical Decision Making Amount Jeffery/or Complexity of Data Reviewed Labs: ordered. Radiology: ordered.  Risk Prescription drug management. Decision regarding hospitalization.   This patient is a 67 y.o. male  who presents to the ED for concern of altered mental status.   Differential diagnoses prior to evaluation: The emergent differential diagnosis includes, but is not limited to,  Drug-related, hypoxia, hyper/hypoglycemia, encephalopathy, sepsis, DKA/HHS, brain lesion, CVA, seizure, environmental, psychiatric. This is not an exhaustive differential.  Past Medical History / Co-morbidities: mental [name], Jeffery [name], Jeffery [name], Jeffery seizures on depakote Jeffery phenytoin  Additional history: Chart reviewed. Pertinent results include: Currently follows with [name], lives at home with sister who is legal guardian.   Physical Exam: Physical exam performed. The pertinent findings include: Mildly hypertensive, otherwise normal vital signs, afebrile.  Patient can tell me his name, refused to answer all other questions.  Abrasion noted on the forehead Jeffery the nose, without cranial deformity.  Abdomen soft Jeffery nontender.  Lab Tests/Imaging studies: I personally interpreted labs/imaging Jeffery the pertinent results include: No leukocytosis, normal hemoglobin.  CMP unremarkable, with normal kidney function.  Urinalysis significant for positive nitrites, trace leukocytes, 11-20 WBCs, few bacteria.   Attempted CT of the head, but patient was moving too much to obtain successful image.   Cardiac monitoring: EKG obtained Jeffery interpreted by my attending physician which shows: sinus rhythm, no significant change   Medications: I ordered medication including rocephin for UTI Jeffery versed for agitation.  I have reviewed the patients home medicines Jeffery have made adjustments as needed.  Consultations obtained: I consulted with hospitalist Dr Flossie Buffy who will admit.    Disposition: After  consideration of the diagnostic results Jeffery the patients response to treatment, I feel that patient would benefit from medical admission for altered mental status/delirium in the setting of UTI.   Final Clinical Impression(s) / ED Diagnoses Final diagnoses:  Delirium  Acute cystitis without hematuria  Combative behavior    Rx / DC Orders ED Discharge Orders     None      Portions of this report may have been transcribed using voice recognition software. Every effort was made to ensure accuracy; however, inadvertent computerized transcription errors may be present.    Estill Cotta 04/07/22 1853    Blanchie Dessert, MD 04/08/22 [phone]

## 2022-04-07 NOTE — Assessment & Plan Note (Signed)
Continue IV Rocephin pending urine culture 

## 2022-04-07 NOTE — Progress Notes (Signed)
Give report to [employer]

## 2022-04-07 NOTE — ED Notes (Signed)
Pt sister/caregiver at bedside. 

## 2022-04-08 DIAGNOSIS — R410 Disorientation, unspecified: Secondary | ICD-10-CM | POA: Diagnosis present

## 2022-04-08 DIAGNOSIS — R4689 Other symptoms and signs involving appearance and behavior: Secondary | ICD-10-CM

## 2022-04-08 DIAGNOSIS — W19XXXA Unspecified fall, initial encounter: Secondary | ICD-10-CM | POA: Diagnosis present

## 2022-04-08 DIAGNOSIS — Z8249 Family history of ischemic heart disease and other diseases of the circulatory system: Secondary | ICD-10-CM | POA: Diagnosis not present

## 2022-04-08 DIAGNOSIS — N3000 Acute cystitis without hematuria: Secondary | ICD-10-CM | POA: Diagnosis not present

## 2022-04-08 DIAGNOSIS — G9340 Encephalopathy, unspecified: Secondary | ICD-10-CM | POA: Diagnosis present

## 2022-04-08 DIAGNOSIS — S0031XA Abrasion of nose, initial encounter: Secondary | ICD-10-CM | POA: Diagnosis present

## 2022-04-08 DIAGNOSIS — S0081XA Abrasion of other part of head, initial encounter: Secondary | ICD-10-CM | POA: Diagnosis present

## 2022-04-08 DIAGNOSIS — D696 Thrombocytopenia, unspecified: Secondary | ICD-10-CM | POA: Diagnosis present

## 2022-04-08 DIAGNOSIS — R030 Elevated blood-pressure reading, without diagnosis of hypertension: Secondary | ICD-10-CM | POA: Diagnosis present

## 2022-04-08 DIAGNOSIS — F79 Unspecified intellectual disabilities: Secondary | ICD-10-CM | POA: Diagnosis present

## 2022-04-08 DIAGNOSIS — Z781 Physical restraint status: Secondary | ICD-10-CM | POA: Diagnosis not present

## 2022-04-08 DIAGNOSIS — E7800 Pure hypercholesterolemia, unspecified: Secondary | ICD-10-CM | POA: Diagnosis present

## 2022-04-08 DIAGNOSIS — N390 Urinary tract infection, site not specified: Secondary | ICD-10-CM | POA: Diagnosis present

## 2022-04-08 DIAGNOSIS — Z79899 Other long term (current) drug therapy: Secondary | ICD-10-CM | POA: Diagnosis not present

## 2022-04-08 DIAGNOSIS — Z87891 Personal history of nicotine dependence: Secondary | ICD-10-CM | POA: Diagnosis not present

## 2022-04-08 DIAGNOSIS — Z87440 Personal history of urinary (tract) infections: Secondary | ICD-10-CM | POA: Diagnosis not present

## 2022-04-08 DIAGNOSIS — G40909 Epilepsy, unspecified, not intractable, without status epilepticus: Secondary | ICD-10-CM | POA: Diagnosis present

## 2022-04-08 DIAGNOSIS — Z7982 Long term (current) use of aspirin: Secondary | ICD-10-CM | POA: Diagnosis not present

## 2022-04-08 DIAGNOSIS — Z8546 Personal history of malignant neoplasm of prostate: Secondary | ICD-10-CM | POA: Diagnosis not present

## 2022-04-08 LAB — RAPID URINE DRUG SCREEN, HOSP PERFORMED
Amphetamines: NOT DETECTED
Barbiturates: NOT DETECTED
Benzodiazepines: POSITIVE — AB
Cocaine: NOT DETECTED
Opiates: NOT DETECTED
Tetrahydrocannabinol: NOT DETECTED

## 2022-04-08 LAB — CBC
HCT: 42 % (ref 39.0–52.0)
Hemoglobin: 13.7 g/dL (ref 13.0–17.0)
MCH: 32.2 pg (ref 26.0–34.0)
MCHC: 32.6 g/dL (ref 30.0–36.0)
MCV: 98.6 fL (ref 80.0–100.0)
Platelets: 186 10*3/uL (ref 150–400)
RBC: 4.26 MIL/uL (ref 4.22–5.81)
RDW: 12.7 % (ref 11.5–15.5)
WBC: 6.8 10*3/uL (ref 4.0–10.5)
nRBC: 0 % (ref 0.0–0.2)

## 2022-04-08 MED ORDER — PHENYTOIN SODIUM EXTENDED 100 MG PO CAPS
200.0000 mg | ORAL_CAPSULE | Freq: Every day | ORAL | Status: DC
  Administered 2022-04-08 – 2022-04-09 (×2): 200 mg via ORAL
  Filled 2022-04-08 (×2): qty 2

## 2022-04-08 MED ORDER — DIVALPROEX SODIUM ER 500 MG PO TB24
1500.0000 mg | ORAL_TABLET | Freq: Every day | ORAL | Status: DC
  Administered 2022-04-08 – 2022-04-09 (×2): 1500 mg via ORAL
  Filled 2022-04-08 (×2): qty 3

## 2022-04-08 NOTE — Progress Notes (Signed)
  Transition of Care [facility]) Screening Note   Patient Details  Name: [name] Date of Birth: [birth year]   Transition of Care [facility]) CM/SW Contact:    Henrietta Dine, RN Phone Number: 04/08/2022, 11:05 AM    Transition of Care Department [facility]) has reviewed patient and no TOC needs have been identified at this time. We will continue to monitor patient advancement through interdisciplinary progression rounds. If new patient transition needs arise, please place a TOC consult.

## 2022-04-08 NOTE — Progress Notes (Signed)
[facility]                                                                              Tyran Cordes, is a 67 y.o. male, DOB - 1955/04/13, [id number] Admit date - 04/07/2022    Outpatient Primary MD for the patient is Caron Presume, Angelyn Punt, MD  LOS - 0  days  Chief Complaint  Patient presents with   Agitation       Brief summary   Patient is a 67 year old male with cognitive and intellectual delay, history of prostate CA status post redo prostatectomy 06/2021, seizures, recurrent UTIs presented with agitation and aggression.  Sister is the primary caregiver, also receives help from [facility].  Per sister, he was more weaker about 2 days and difficult getting out of the bed.  On the day of admission was very agitated and swinging at people.  Usually he gets agitated when he has UTI.  He is followed by urology for recurrent UTIs and history of prostate CA. UA positive for UTI.  Assessment & Plan    Principal Problem:   AMS (altered mental status)/acute encephalopathy -Currently calm, awake, sitter at the bedside.  Overnight restraints removed earlier this morning. -Continue IV antibiotics for UTI  Active Problems:   UTI (urinary tract infection) -Follow urine culture and sensitivities -Continue IV Rocephin    Seizure disorder (HCC) -Continue IV phenytoin, IV Depakote  Estimated body mass index is 21.44 kg/m as calculated from the following:   Height as of this encounter: 5\' 7"  (1.702 m).   Weight as of this encounter: 62.1 kg.  Code Status: Full CODE STATUS DVT Prophylaxis:  enoxaparin (LOVENOX) injection 40 mg Start: 04/07/22 2200   Level of Care: Level of care: Med-Surg Family Communication: Disposition Plan:      Remains inpatient appropriate:     Procedures:   Consultants:    Antimicrobials:   Anti-infectives (From admission, onward)    Start     Dose/Rate Route Frequency Ordered Stop   04/08/22 1600  cefTRIAXone (ROCEPHIN) 1 g in sodium  chloride 0.9 % 100 mL IVPB        1 g 200 mL/hr over 30 Minutes Intravenous Every 24 hours 04/07/22 2111     04/07/22 1700  cefTRIAXone (ROCEPHIN) 1 g in sodium chloride 0.9 % 100 mL IVPB        1 g 200 mL/hr over 30 Minutes Intravenous  Once 04/07/22 1648 04/07/22 1721          Medications  aspirin  81 mg Oral Daily   citalopram  20 mg Oral Daily   enoxaparin (LOVENOX) injection  40 mg Subcutaneous Q24H      Subjective:   Jeffery [name] was seen and examined today.  Currently calm, appears close to his baseline mental status.  Safety sitter at the bedside.  Overnight restraints have been removed this a.m.  However does not respond to any questions for ROS.  No fevers, nausea or vomiting.  Objective:   Vitals:   04/07/22 2100 04/08/22 0009 04/08/22 0451 04/08/22 0819  BP:  123/71 (!) 131/59 114/74  Pulse:  82 67  73  Resp:  16 16 18   Temp:  98.5 F (36.9 C) 97.9 F (36.6 C) 98 F (36.7 C)  TempSrc:  Axillary  Oral  SpO2:  99% 100% 95%  Weight: 62.1 kg     Height: 5\' 7"  (1.702 m)       Intake/Output Summary (Last 24 hours) at 04/08/2022 1014 Last data filed at 04/08/2022 0920 Gross per 24 hour  Intake 572.84 ml  Output 400 ml  Net 172.84 ml     Wt Readings from Last 3 Encounters:  04/07/22 62.1 kg  10/18/21 62.1 kg  07/05/21 61.2 kg     Exam General: Awake, NAD Cardiovascular: S1 S2 auscultated,  RRR Respiratory: Clear to auscultation bilaterally Gastrointestinal: Soft, nontender, nondistended, + bowel sounds Ext: no pedal edema bilaterally Neuro: does not follow commands Psych: has history of cognitive disability    Data Reviewed:  I have personally reviewed following labs    CBC Lab Results  Component Value Date   WBC 6.8 04/08/2022   RBC 4.26 04/08/2022   HGB 13.7 04/08/2022   HCT 42.0 04/08/2022   MCV 98.6 04/08/2022   MCH 32.2 04/08/2022   PLT 186 04/08/2022   MCHC 32.6 04/08/2022   RDW 12.7 04/08/2022   LYMPHSABS 2.0 04/07/2022    MONOABS 0.6 04/07/2022   EOSABS 0.1 04/07/2022   BASOSABS 0.0 [id number]     Last metabolic panel Lab Results  Component Value Date   NA 138 04/07/2022   K 3.9 04/07/2022   CL 105 04/07/2022   CO2 24 04/07/2022   BUN 18 04/07/2022   CREATININE 0.85 04/07/2022   GLUCOSE 84 04/07/2022   GFRNONAA >60 04/07/2022   GFRAA >60 12/10/2018   CALCIUM 9.1 04/07/2022   PROT 7.5 04/07/2022   ALBUMIN 3.8 04/07/2022   LABGLOB 2.6 07/07/2020   AGRATIO 1.7 07/07/2020   BILITOT 0.6 04/07/2022   ALKPHOS 77 04/07/2022   AST 29 04/07/2022   ALT 25 04/07/2022   ANIONGAP 9 04/07/2022    CBG (last 3)  No results for input(s): "GLUCAP" in the last 72 hours.    Coagulation Profile: Recent Labs  Lab 04/07/22 1524  INR 1.1     Radiology Studies: I have personally reviewed the imaging studies  DG Chest Port 1 View  Result Date: 04/07/2022 CLINICAL DATA:  UTI.  Agitated EXAM: PORTABLE CHEST 1 VIEW COMPARISON:  X-ray 11/26/2019 and older FINDINGS: Film is slightly rotated to the right. No consolidation, pneumothorax, effusion or edema. Normal cardiopericardial silhouette. Overlapping cardiac leads. Mild degenerative changes along the spine. IMPRESSION: No acute cardiopulmonary disease Electronically Signed   By: Jill Side M.D.   On: 04/07/2022 15:28       Jerrica Thorman M.D. [facility] 04/08/2022, 10:14 AM  Available via Epic secure chat 7am-7pm After 7 pm, please refer to night coverage provider listed on amion.

## 2022-04-09 DIAGNOSIS — N3000 Acute cystitis without hematuria: Secondary | ICD-10-CM | POA: Diagnosis not present

## 2022-04-09 DIAGNOSIS — R410 Disorientation, unspecified: Secondary | ICD-10-CM | POA: Diagnosis not present

## 2022-04-09 DIAGNOSIS — R4689 Other symptoms and signs involving appearance and behavior: Secondary | ICD-10-CM | POA: Diagnosis not present

## 2022-04-09 LAB — URINE CULTURE

## 2022-04-09 NOTE — Progress Notes (Signed)
[facility]                                                                              Jeffery [name], is a 67 y.o. male, DOB - 1955/04/14, [id number] Admit date - 04/07/2022    Outpatient Primary MD for the patient is Caron Presume, Angelyn Punt, MD  LOS - 1  days  Chief Complaint  Patient presents with   Agitation       Brief summary   Patient is a 67 year old male with cognitive and intellectual delay, history of prostate CA status post redo prostatectomy 06/2021, seizures, recurrent UTIs presented with agitation and aggression.  Sister is the primary caregiver, also receives help from [facility].  Per sister, he was more weaker about 2 days and difficult getting out of the bed.  On the day of admission was very agitated and swinging at people.  Usually he gets agitated when he has UTI.  He is followed by urology for recurrent UTIs and history of prostate CA. UA positive for UTI.  Assessment & Plan    Principal Problem:   AMS (altered mental status)/acute encephalopathy -Currently resting comfortably, sitter at the bedside. -Continue IV antibiotics for UTI  Active Problems:   UTI (urinary tract infection) -Urine culture showed multiple species -Continue IV Rocephin    Seizure disorder (HCC) -Taking oral meds, continue Depakote, Dilantin  Estimated body mass index is 21.44 kg/m as calculated from the following:   Height as of this encounter: 5\' 7"  (1.702 m).   Weight as of this encounter: 62.1 kg.  Code Status: Full CODE STATUS DVT Prophylaxis:  enoxaparin (LOVENOX) injection 40 mg Start: 04/07/22 2200   Level of Care: Level of care: Med-Surg Family Communication: Called and updated patient's sister, Ms Sigurd Sos on the phone Disposition Plan:      Remains inpatient appropriate: Plan to DC home in a.m.   Procedures:   Consultants:    Antimicrobials:   Anti-infectives (From admission, onward)    Start     Dose/Rate Route Frequency Ordered Stop    04/08/22 1600  cefTRIAXone (ROCEPHIN) 1 g in sodium chloride 0.9 % 100 mL IVPB        1 g 200 mL/hr over 30 Minutes Intravenous Every 24 hours 04/07/22 2111     04/07/22 1700  cefTRIAXone (ROCEPHIN) 1 g in sodium chloride 0.9 % 100 mL IVPB        1 g 200 mL/hr over 30 Minutes Intravenous  Once 04/07/22 1648 04/07/22 1721          Medications  aspirin  81 mg Oral Daily   citalopram  20 mg Oral Daily   divalproex  1,500 mg Oral QHS   enoxaparin (LOVENOX) injection  40 mg Subcutaneous Q24H   phenytoin  200 mg Oral QHS      Subjective:   Jeffery [name] was seen and examined today.  Appears to be calm and sleepy this morning.  No further agitation, sitter in the room.  No acute issues with agitation overnight.  No fevers, nausea or vomiting.  Objective:   Vitals:   04/08/22 0819 04/08/22 1205 04/08/22  1606 04/08/22 2002  BP: 114/74 94/64 102/75 106/64  Pulse: 73 76 78 77  Resp: 18 20 18 17   Temp: 98 F (36.7 C) 98.7 F (37.1 C) 98.8 F (37.1 C) 98.5 F (36.9 C)  TempSrc: Oral Oral Oral Oral  SpO2: 95% 97% 98% 98%  Weight:      Height:        Intake/Output Summary (Last 24 hours) at 04/09/2022 1203 Last data filed at 04/09/2022 0900 Gross per 24 hour  Intake 1040.28 ml  Output 1000 ml  Net 40.28 ml     Wt Readings from Last 3 Encounters:  04/07/22 62.1 kg  10/18/21 62.1 kg  07/05/21 61.2 kg   Physical Exam General: Sleepy, resting comfortably, NAD, sitter at the bedside Cardiovascular: S1 S2 clear, RRR.  Respiratory: CTAB, no wheezing, rales or rhonchi Gastrointestinal: Soft, nontender, nondistended, NBS Ext: no pedal edema bilaterally Neuro: does not follow commands Psych: history of cognitive disability    Data Reviewed:  I have personally reviewed following labs    CBC Lab Results  Component Value Date   WBC 6.8 04/08/2022   RBC 4.26 04/08/2022   HGB 13.7 04/08/2022   HCT 42.0 04/08/2022   MCV 98.6 04/08/2022   MCH 32.2 04/08/2022   PLT  186 04/08/2022   MCHC 32.6 04/08/2022   RDW 12.7 04/08/2022   LYMPHSABS 2.0 04/07/2022   MONOABS 0.6 04/07/2022   EOSABS 0.1 04/07/2022   BASOSABS 0.0 [id number]     Last metabolic panel Lab Results  Component Value Date   NA 138 04/07/2022   K 3.9 04/07/2022   CL 105 04/07/2022   CO2 24 04/07/2022   BUN 18 04/07/2022   CREATININE 0.85 04/07/2022   GLUCOSE 84 04/07/2022   GFRNONAA >60 04/07/2022   GFRAA >60 12/10/2018   CALCIUM 9.1 04/07/2022   PROT 7.5 04/07/2022   ALBUMIN 3.8 04/07/2022   LABGLOB 2.6 07/07/2020   AGRATIO 1.7 07/07/2020   BILITOT 0.6 04/07/2022   ALKPHOS 77 04/07/2022   AST 29 04/07/2022   ALT 25 04/07/2022   ANIONGAP 9 04/07/2022    CBG (last 3)  No results for input(s): "GLUCAP" in the last 72 hours.    Coagulation Profile: Recent Labs  Lab 04/07/22 1524  INR 1.1     Radiology Studies: I have personally reviewed the imaging studies  DG Chest Port 1 View  Result Date: 04/07/2022 CLINICAL DATA:  UTI.  Agitated EXAM: PORTABLE CHEST 1 VIEW COMPARISON:  X-ray 11/26/2019 and older FINDINGS: Film is slightly rotated to the right. No consolidation, pneumothorax, effusion or edema. Normal cardiopericardial silhouette. Overlapping cardiac leads. Mild degenerative changes along the spine. IMPRESSION: No acute cardiopulmonary disease Electronically Signed   By: Jill Side M.D.   On: 04/07/2022 15:28       Jeffery [name] M.D. [facility] 04/09/2022, 12:03 PM  Available via Epic secure chat 7am-7pm After 7 pm, please refer to night coverage provider listed on amion.

## 2022-04-10 DIAGNOSIS — N3000 Acute cystitis without hematuria: Secondary | ICD-10-CM | POA: Diagnosis not present

## 2022-04-10 DIAGNOSIS — R410 Disorientation, unspecified: Secondary | ICD-10-CM | POA: Diagnosis not present

## 2022-04-10 DIAGNOSIS — R4689 Other symptoms and signs involving appearance and behavior: Secondary | ICD-10-CM | POA: Diagnosis not present

## 2022-04-10 MED ORDER — CEPHALEXIN 500 MG PO CAPS: 500.0000 mg | ORAL_CAPSULE | Freq: Two times a day (BID) | ORAL | 0 refills | Status: AC

## 2022-04-10 NOTE — Progress Notes (Signed)
Attempted to reach sister about discharge. Went to [facility].

## 2022-04-10 NOTE — Plan of Care (Signed)

## 2022-04-10 NOTE — Discharge Summary (Signed)
Physician Discharge Summary   Patient: Jeffery [name] MRN: [id number] DOB: 05-22-1955  Admit date:     04/07/2022  Discharge date: 04/10/22  Discharge Physician: Estill Cotta, MD    PCP: Concepcion Living, MD   Recommendations at discharge:   Continue Keflex 500 mg twice daily for 4 more days  Discharge Diagnoses:    AMS (altered mental status)   Seizure disorder [facility])   UTI (urinary tract infection) History of cognitive and intellectual delay  Hospital Course:  Patient is a 67 year old male with cognitive and intellectual delay, history of prostate CA status post redo prostatectomy 06/2021, seizures, recurrent UTIs presented with agitation and aggression.  Sister is the primary caregiver, also receives help [facility].  Per sister, he was more weaker about 2 days and difficult getting out of the bed.  On the day of admission was very agitated and swinging at people.  Usually he gets agitated when he has UTI.  He is followed by urology for recurrent UTIs and history of prostate CA. UA positive for UTI.  Assessment and Plan:    AMS (altered mental status)/acute encephalopathy in the setting of cognitive and intellectual delay -Worsened likely due to UTI -Patient was placed on IV antibiotics, initially required restraints, subsequently improved and has been close to his baseline. Sales promotion account executive was placed.      UTI (urinary tract infection) -Urine culture showed multiple species -Initially placed on IV Rocephin, received 3 doses, transition to oral Keflex 500 mg twice daily for 4 more days to complete full course for 7 days     Seizure disorder ([place]) -Initially placed on IV route, now taking oral meds, continue Depakote, Dilantin   Estimated body mass index is 21.44 kg/m as calculated from the following:   Height as of this encounter: 5\' 7"  (1.702 m).   Weight as of this encounter: 62.1 kg.  Plan discussed with patient's sister and she feels comfortable with discharge  today     Pain control - [place] was reviewed. and patient was instructed, not to drive, operate heavy machinery, perform activities at heights, swimming or participation in water activities or provide baby-sitting services while on Pain, Sleep and Anxiety Medications; until their outpatient Physician has advised to do so again. Also recommended to not to take more than prescribed Pain, Sleep and Anxiety Medications.  Consultants: None Procedures performed: None Disposition: Home Diet recommendation:  Discharge Diet Orders (From admission, onward)     Start     Ordered   04/10/22 0000  Diet general        04/10/22 0959            DISCHARGE MEDICATION: Allergies as of 04/10/2022   No Known Allergies      Medication List     TAKE these medications    ASPIRIN PO Take 81 mg by mouth at bedtime.   atorvastatin 20 MG tablet Commonly known as: LIPITOR Take 20 mg by mouth at bedtime.   cephALEXin 500 MG capsule Commonly known as: KEFLEX Take 1 capsule (500 mg total) by mouth 2 (two) times daily for 4 days.   citalopram 20 MG tablet Commonly known as: CELEXA Take 20 mg by mouth at bedtime.   divalproex 500 MG 24 hr tablet Commonly known as: DEPAKOTE ER Take 3 tablets (1,500 mg total) by mouth daily. What changed: when to take this   docusate sodium 100 MG capsule Commonly known as: COLACE Take 1  capsule (100 mg total) by mouth 2 (two) times daily. What changed: when to take this   phenytoin 100 MG ER capsule Commonly known as: Dilantin Take 2 capsules (200 mg total) by mouth at bedtime.   VITAMIN E PO Take 1 tablet by mouth at bedtime.        Follow-up Information     Concepcion Living, MD. Schedule an appointment as soon as possible for a visit in 2 week(s).   Specialty: Family Medicine Why: for hospital follow-up Contact information: [id number] [address] [phone]                 Discharge Exam: Danley Danker Weights   04/07/22 2100  Weight: 62.1 kg   Alert and awake, safety sitter at the bedside, no acute issues.  No fevers.  BP 103/72 (BP Location: Left Arm)   Pulse 75   Temp 99 F (37.2 C) (Oral)   Resp 17   Ht 5\' 7"  (1.702 m)   Wt 62.1 kg   SpO2 98%   BMI 21.44 kg/m   Physical Exam General: Alert and awake, NAD Cardiovascular: S1 S2 clear, RRR.  Respiratory: CTAB, no wheezing, rales or rhonchi Gastrointestinal: Soft, nontender, nondistended, NBS Ext: no pedal edema bilaterally Neuro: does not commands however moving all 4 extremities spontaneously Psych: appears close to his baseline   Condition at discharge: fair  The results of significant diagnostics from this hospitalization (including imaging, microbiology, ancillary and laboratory) are listed below for reference.   Imaging Studies: DG Chest Port 1 View  Result Date: 04/07/2022 CLINICAL DATA:  UTI.  Agitated EXAM: PORTABLE CHEST 1 VIEW COMPARISON:  X-ray 11/26/2019 and older FINDINGS: Film is slightly rotated to the right. No consolidation, pneumothorax, effusion or edema. Normal cardiopericardial silhouette. Overlapping cardiac leads. Mild degenerative changes along the spine. IMPRESSION: No acute cardiopulmonary disease Electronically Signed   By: Jill Side M.D.   On: 04/07/2022 15:28    Microbiology: Results for orders placed or performed during the hospital encounter of 04/07/22  Urine Culture     Status: Abnormal   Collection Time: 04/07/22  3:34 PM   Specimen: Urine, Clean Catch  Result Value Ref Range Status   Specimen Description   Final    URINE, CLEAN CATCH Performed at [facility], [place] [address], [place], [place] [address]    Special Requests   Final    NONE Performed at [facility], [place] [address], [place], [place] [address]    Culture MULTIPLE SPECIES PRESENT, SUGGEST RECOLLECTION (A)  Final   Report Status 04/09/2022  FINAL  Final    Labs: CBC: Recent Labs  Lab 04/07/22 1524 04/08/22 0524  WBC 6.9 6.8  NEUTROABS 4.2  --   HGB 14.6 13.7  HCT 45.7 42.0  MCV 98.9 98.6  PLT 119* [id number]   Basic Metabolic Panel: Recent Labs  Lab 04/07/22 1524  NA 138  K 3.9  CL 105  CO2 24  GLUCOSE 84  BUN 18  CREATININE 0.85  CALCIUM 9.1   Liver Function Tests: Recent Labs  Lab 04/07/22 1524  AST 29  ALT 25  ALKPHOS 77  BILITOT 0.6  PROT 7.5  ALBUMIN 3.8   CBG: No results for input(s): "GLUCAP" in the last 168 hours.  Discharge time spent: greater than 30 minutes.  Signed: Estill Cotta, MD [facility] 04/10/2022

## 2022-04-11 ENCOUNTER — Telehealth: Payer: Self-pay

## 2022-04-11 NOTE — Transitions of Care (Post Inpatient/ED Visit) (Unsigned)
   04/11/2022  Name: Rajon Neitzke MRN: [id number] DOB: Jul 03, 1955  Today's TOC FU Call Status: Today's TOC FU Call Status:: Unsuccessul Call (1st Attempt) Unsuccessful Call (1st Attempt) Date: 04/11/22  Attempted to reach the patient regarding the most recent Inpatient/ED visit.  Follow Up Plan: Additional outreach attempts will be made to reach the patient to complete the Transitions of Care (Post Inpatient/ED visit) call.   Signature Juanda Crumble, [place] Direct Dial [phone]

## 2022-04-12 NOTE — Transitions of Care (Post Inpatient/ED Visit) (Signed)
   04/12/2022  Name: Jeffery [name] MRN: [id number] DOB: [birth year]  Today's TOC FU Call Status: Today's TOC FU Call Status:: Successful TOC FU Call Competed Unsuccessful Call (1st Attempt) Date: 04/11/22 [facility] FU Call Complete Date: 04/12/22  Transition Care Management Follow-up Telephone Call Date of Discharge: 04/10/22 Discharge Facility: [facility]) Type of Discharge: Inpatient Admission Primary Inpatient Discharge Diagnosis:: disorientation How have you been since you were released from the hospital?: Better Any questions or concerns?: No  Items Reviewed: Did you receive and understand the discharge instructions provided?: Yes Medications obtained and verified?: Yes (Medications Reviewed) Any new allergies since your discharge?: No Dietary orders reviewed?: Yes Do you have support at home?: Yes People in Home: sibling(s)  Home Care and Equipment/Supplies: [place] Ordered?: NA Any new equipment or medical supplies ordered?: NA  Functional Questionnaire: Do you need assistance with bathing/showering or dressing?: No Do you need assistance with meal preparation?: Yes Do you need assistance with eating?: No Do you have difficulty maintaining continence: No Do you need assistance with getting out of bed/getting out of a chair/moving?: No Do you have difficulty managing or taking your medications?: Yes  Follow up appointments reviewed: PCP Follow-up appointment confirmed?: NA (He sees the doctors at [facility] per sister) [facility] Follow-up appointment confirmed?: NA Do you need transportation to your follow-up appointment?: No Do you understand care options if your condition(s) worsen?: Yes-patient verbalized understanding    [place], [place] Direct Dial [phone]

## 2022-05-08 ENCOUNTER — Encounter: Payer: Self-pay | Admitting: Adult Health

## 2022-05-08 ENCOUNTER — Ambulatory Visit (INDEPENDENT_AMBULATORY_CARE_PROVIDER_SITE_OTHER): Payer: Medicare (Managed Care) | Admitting: Adult Health

## 2022-05-08 VITALS — BP 111/69 | HR 65 | Ht 67.0 in | Wt 137.0 lb

## 2022-05-08 DIAGNOSIS — G40909 Epilepsy, unspecified, not intractable, without status epilepticus: Secondary | ICD-10-CM

## 2022-05-08 NOTE — Progress Notes (Signed)
PATIENT: Jeffery [name] DOB: 06/02/1955  REASON FOR VISIT: follow up HISTORY FROM: patient  Chief Complaint  Patient presents with   Follow-up    Pt in 19 with sister Sister states  pt had seizure this past Thursday No ED visit required  Sister states pt was sleeping Pt admitted to hospital in March 2024 due to UTI      HISTORY OF PRESENT ILLNESS: Today 05/08/22:  Jeffery [name] is a 67 y.o. male with a history of seizures. Returns today for follow-up. He is here with his sister today. He lives with her but goes to [facility] during the day. She reports that he had a seizure last week. She went to wake him up and saw him convulsing in all extremities. Denies any missed doses. Had a UTI a month ago but that was treated and resolved. Remains on Depakote 1500 mg daily and Dilantin 200 mg daily.    10/18/21: Jeffery [name] is a 67 year old male with a history of mental retardation and seizures.  He returns today for follow-up.  He is here today with his sister.  She reports that over the last week he has had a hard time walking and his balance she feels that this is related to his seizure medication.  He also had a urinary tract infection today is his last day of antibiotic.  She has not noticed significant improvement in his gait since he started the antibiotic.  the patient has not had any seizure events in the last 6 months.  Currently on Dilantin 250 mg daily and Depakote 1500 mg daily.  Dilantin level was checked that patient was in normal range  11/02/2020 Jeffery [name] is a 67 year old male with a history of mental retardation and seizures.  He returns today for follow-up.  The patient had a breakthrough seizure about a week and a half ago.  His sister states that he did miss a dose of his medication.  He was taken Dilantin 200 mg.  His dose of Dilantin have been reduced from 250-200 due to increased blood level.  Dilantin has been increased back to 250 mg daily.  The patient has not had any seizures  since.  07/07/20: Jeffery [name] is a 67 year old male with a history of mental retardation and seizures.  He is here today with his sister.  She reports that over the last year he may have had 6 seizures.  She states that he typically presents with shaking and his whole entire body gets stiff.  She does not recall when the last seizure was.  [facility] att[facility].  Reports that his Dilantin level was recently increased to 250 mg at bedtime due to low levels.  He remains on Depakote 1500 mg daily.  She returns today for an evaluation.  07/07/19: Jeffery [name] is a 67 year old male with a history of mental retardation and seizures.  He returns today for follow-up.  He is here today with his sister.  He continues to live with her.  He remains on Depakote and Dilantin.  He is a part of [facility].  Denies any seizure events.  Returns today for an evaluation.  HISTORY 01/01/19:   Jeffery [name] is a 67 year old male with a history of mental retardation and seizures.  He returns today for follow-up.  He is currently taking Depakote 1500 mg daily and Dilantin 250 mg daily.  He is here today with his caregiver.  They deny any seizure events.  He  did go to the emergency room in November for altered mental status but work-up was relatively unremarkable.  He lives with his sister and brother-in-law.  His brother-in-law reports that he is able to complete most ADLs independently.  They help him with his medication and food.  He returns today for evaluation  REVIEW OF SYSTEMS: Out of a complete 14 system review of symptoms, the patient complains only of the following symptoms, and all other reviewed systems are negative.  See HPI  ALLERGIES: No Known Allergies  HOME MEDICATIONS: Outpatient Medications Prior to Visit  Medication Sig Dispense Refill   ASPIRIN PO Take 81 mg by mouth at bedtime.     atorvastatin (LIPITOR) 20 MG tablet Take 20 mg by mouth at bedtime.     citalopram (CELEXA) 20 MG tablet Take  20 mg by mouth at bedtime.     divalproex (DEPAKOTE ER) 500 MG 24 hr tablet Take 3 tablets (1,500 mg total) by mouth daily. (Patient taking differently: Take 1,500 mg by mouth at bedtime.) 90 tablet 4   docusate sodium (COLACE) 100 MG capsule Take 1 capsule (100 mg total) by mouth 2 (two) times daily. (Patient taking differently: Take 100 mg by mouth at bedtime.)     phenytoin (DILANTIN) 100 MG ER capsule Take 2 capsules (200 mg total) by mouth at bedtime. 180 capsule 3   VITAMIN E PO Take 1 tablet by mouth at bedtime.     No facility-administered medications prior to visit.    PAST MEDICAL HISTORY: Past Medical History:  Diagnosis Date   Cancer    PROSTATE CANCER--PROSTATE BIOPSY PLANNED EVERY 6 MONTHS--NO TX PLANNED UNLESS THE CANCER STARTS PROGRESSING-   Hypercholesteremia    under control   Mental retardation 03-20-11   Sister states pt. developed retardation symptoms about age 65,? whether drug related. Start having seizures. Hx. ETOH abuse in past. -"cognitive issues-can not live alone"   Seizures    last seizure 10/27/13  [place]    PAST SURGICAL HISTORY: Past Surgical History:  Procedure Laterality Date   LYMPH NODE DISSECTION Bilateral 07/15/2021   Procedure: PELVIC LYMPH NODE DISSECTION;  Surgeon: Sebastian Ache, MD;  Location: [facility];  Service: Urology;  Laterality: Bilateral;   PROSTATE BIOPSY  03/27/2011   Procedure: BIOPSY TRANSRECTAL ULTRASONIC PROSTATE (TUBP);  Surgeon: Milford Cage, MD;  Location: [facility];  Service: Urology;  Laterality: N/A;  Prostatic Block        PROSTATE BIOPSY  09/27/2011   Procedure: BIOPSY TRANSRECTAL ULTRASONIC PROSTATE (TUBP);  Surgeon: Milford Cage, MD;  Location: [facility];  Service: Urology;  Laterality: N/A;        PROSTATE BIOPSY N/A 10/29/2013   Procedure: BIOPSY TRANSRECTAL ULTRASONIC PROSTATE (TUBP);  Surgeon: Sebastian Ache, MD;  Location: [facility];  Service: Urology;  Laterality: N/A;   ROBOT ASSISTED  LAPAROSCOPIC RADICAL PROSTATECTOMY N/A 07/15/2021   Procedure: XI ROBOTIC ASSISTED LAPAROSCOPIC RADICAL PROSTATECTOMY; CIRCUMCISION;  Surgeon: Sebastian Ache, MD;  Location: [facility];  Service: Urology;  Laterality: N/A;    FAMILY HISTORY: Family History  Problem Relation Age of Onset   Cancer Sister        breast   Hypertension Sister    Diabetes Sister    Hypertension Sister    Seizures Neg Hx     SOCIAL HISTORY: Social History   Socioeconomic History   Marital status: Single    Spouse name: Not on file   Number of children: 0   Years of education: Not  on file   Highest education level: Not on file  Occupational History   Occupation: Disability   Tobacco Use   Smoking status: Former    Packs/day: 0.50    Years: 10.00    Additional pack years: 0.00    Total pack years: 5.00    Types: Cigarettes   Smokeless tobacco: Never   Tobacco comments:    SNEAKS A SMOKE EVERY NOW AND THEN  Vaping Use   Vaping Use: Never used  Substance and Sexual Activity   Alcohol use: No    Comment: none in 15 yrs,past ETOH abuse Sneaks liquor once every few months   Drug use: No   Sexual activity: Never  Other Topics Concern   Not on file  Social History Narrative   Patient is single and lives with his sister    Patient does not have any children.   Patient is right-handed.   Rarely drinks caffeine, avoids at home    Goes to adult day care three days per week   Social Determinants of Health   Financial Resource Strain: Not on file  Food Insecurity: No Food Insecurity (04/07/2022)   Hunger Vital Sign    Worried About Running Out of Food in the Last Year: Never true    Ran Out of Food in the Last Year: Never true  Transportation Needs: No Transportation Needs (04/07/2022)   PRAPARE - Administrator, Civil Service (Medical): No    Lack of Transportation (Non-Medical): No  Physical Activity: Not on file  Stress: Not on file  Social Connections: Not on file  Intimate  Partner Violence: Not At Risk (04/07/2022)   Humiliation, Afraid, Rape, and Kick questionnaire    Fear of Current or Ex-Partner: No    Emotionally Abused: No    Physically Abused: No    Sexually Abused: No      PHYSICAL EXAM  Vitals:   05/08/22 1027  BP: 111/69  Pulse: 65  Weight: 137 lb (62.1 kg)  Height:  (1.702 m)   Body mass index is 21.46 kg/m.  Generalized: Well developed, in no acute distress   Neurological examination  Mentation: Alert. Follows all commands.  Nonverbal Cranial nerve II-XII: Pupils were equal round reactive to light. Extraocular movements were full, visual field were full on confrontational test.. Head turning and shoulder shrug  were normal and symmetric. Motor: The motor testing reveals 5 over 5 strength of all 4 extremities. Good symmetric motor tone is noted throughout.  Sensory: Sensory testing is intact to soft touch on all 4 extremities. No evidence of extinction is noted.  Coordination: Cerebellar testing reveals good finger-nose-finger and heel-to-[name] bilaterally.  Gait and station: Gait is good.   DIAGNOSTIC DATA (LABS, IMAGING, TESTING) - I reviewed patient records, labs, notes, testing and imaging myself where available.  Lab Results  Component Value Date   WBC 6.8 04/08/2022   HGB 13.7 04/08/2022   HCT 42.0 04/08/2022   MCV 98.6 04/08/2022   PLT 186 04/08/2022      Component Value Date/Time   NA 138 04/07/2022 1524   NA 144 07/07/2020 1449   K 3.9 04/07/2022 1524   CL 105 04/07/2022 1524   CO2 24 04/07/2022 1524   GLUCOSE 84 04/07/2022 1524   BUN 18 04/07/2022 1524   BUN 18 07/07/2020 1449   CREATININE 0.85 04/07/2022 1524   CREATININE 0.83 01/27/2016 1515   CALCIUM 9.1 04/07/2022 1524   PROT 7.5 04/07/2022 1524   PROT  7.1 07/07/2020 1449   ALBUMIN 3.8 04/07/2022 1524   ALBUMIN 4.5 07/07/2020 1449   AST 29 04/07/2022 1524   ALT 25 04/07/2022 1524   ALKPHOS 77 04/07/2022 1524   BILITOT 0.6 04/07/2022 1524    BILITOT <0.2 07/07/2020 1449   GFRNONAA >60 04/07/2022 1524   GFRNONAA >89 01/27/2016 1515   GFRAA >60 12/10/2018 1139   GFRAA >89 01/27/2016 1515   Lab Results  Component Value Date   CHOL 230 (H) 08/17/2011   HDL 80 08/17/2011   LDLCALC 137 (H) 08/17/2011   LDLDIRECT 101 04/26/2015   TRIG 66 08/17/2011   CHOLHDL 2.9 08/17/2011    ASSESSMENT AND PLAN 67 y.o. year old male  has a past medical history of Cancer, Hypercholesteremia, Mental retardation (03-20-11), and Seizures. here with:  Seizures  Continue Dilantin 200 mg daily at bedtime Continue Depakote 1500 mg daily at bedtime I will check blood work to check levels-pending results we will consider adjustments to medication. Advised if he has any seizure events she should let us know Follow-up in 6 months or sooner if needed   Butch Penny, MSN, NP-C 05/08/2022, 10:42 AM [facility] [address], [address] [place], [place] [address] [phone]

## 2022-05-09 LAB — COMPREHENSIVE METABOLIC PANEL
ALT: 20 IU/L (ref 0–44)
AST: 21 IU/L (ref 0–40)
Albumin/Globulin Ratio: 1.6 (ref 1.2–2.2)
Albumin: 4.1 g/dL (ref 3.9–4.9)
Alkaline Phosphatase: 98 IU/L (ref 44–121)
BUN/Creatinine Ratio: 19 (ref 10–24)
BUN: 16 mg/dL (ref 8–27)
Bilirubin Total: <0.2 mg/dL (ref 0.0–1.2)
CO2: 25 mmol/L (ref 20–29)
Calcium: 9.7 mg/dL (ref 8.6–10.2)
Chloride: 104 mmol/L (ref 96–106)
Creatinine, Ser: 0.83 mg/dL (ref 0.76–1.27)
Globulin, Total: 2.6 g/dL (ref 1.5–4.5)
Glucose: 57 mg/dL — ABNORMAL LOW (ref 70–99)
Potassium: 4.3 mmol/L (ref 3.5–5.2)
Sodium: 145 mmol/L — ABNORMAL HIGH (ref 134–144)
Total Protein: 6.7 g/dL (ref 6.0–8.5)
eGFR: 95 mL/min/{1.73_m2} (ref >59)

## 2022-05-09 LAB — CBC WITH DIFFERENTIAL/PLATELET
Basophils Absolute: 0 10*3/uL (ref 0.0–0.2)
Basos: 0 %
EOS (ABSOLUTE): 0.1 10*3/uL (ref 0.0–0.4)
Eos: 3 %
Hematocrit: 46.5 % (ref 37.5–51.0)
Hemoglobin: 14.8 g/dL (ref 13.0–17.7)
Immature Grans (Abs): 0 10*3/uL (ref 0.0–0.1)
Immature Granulocytes: 0 %
Lymphocytes Absolute: 1.8 10*3/uL (ref 0.7–3.1)
Lymphs: 43 %
MCH: 30.9 pg (ref 26.6–33.0)
MCHC: 31.8 g/dL (ref 31.5–35.7)
MCV: 97 fL (ref 79–97)
Monocytes Absolute: 0.3 10*3/uL (ref 0.1–0.9)
Monocytes: 8 %
Neutrophils Absolute: 1.9 10*3/uL (ref 1.4–7.0)
Neutrophils: 46 %
Platelets: 127 10*3/uL — ABNORMAL LOW (ref 150–450)
RBC: 4.79 x10E6/uL (ref 4.14–5.80)
RDW: 13.5 % (ref 11.6–15.4)
WBC: 4.2 10*3/uL (ref 3.4–10.8)

## 2022-05-09 LAB — VALPROIC ACID LEVEL: Valproic Acid Lvl: 69 ug/mL (ref 50–100)

## 2022-05-09 LAB — PHENYTOIN LEVEL, TOTAL: Phenytoin (Dilantin), Serum: 10 ug/mL (ref 10.0–20.0)

## 2022-11-13 ENCOUNTER — Encounter: Payer: Self-pay | Admitting: Neurology

## 2022-11-13 ENCOUNTER — Ambulatory Visit (INDEPENDENT_AMBULATORY_CARE_PROVIDER_SITE_OTHER): Payer: Medicare (Managed Care) | Admitting: Neurology

## 2022-11-13 VITALS — BP 98/61 | HR 61 | Ht 67.0 in | Wt 138.4 lb

## 2022-11-13 DIAGNOSIS — C61 Malignant neoplasm of prostate: Secondary | ICD-10-CM

## 2022-11-13 DIAGNOSIS — Z5181 Encounter for therapeutic drug level monitoring: Secondary | ICD-10-CM | POA: Diagnosis not present

## 2022-11-13 DIAGNOSIS — G809 Cerebral palsy, unspecified: Secondary | ICD-10-CM

## 2022-11-13 DIAGNOSIS — F73 Profound intellectual disabilities: Secondary | ICD-10-CM

## 2022-11-13 DIAGNOSIS — G40909 Epilepsy, unspecified, not intractable, without status epilepticus: Secondary | ICD-10-CM

## 2022-11-13 DIAGNOSIS — I5032 Chronic diastolic (congestive) heart failure: Secondary | ICD-10-CM

## 2022-11-13 MED ORDER — OYSTER SHELL CALCIUM/D3 500-5 MG-MCG PO TABS: 1.0000 | ORAL_TABLET | Freq: Every day | ORAL | 11 refills | Status: AC

## 2022-11-13 MED ORDER — DILANTIN 100 MG PO CAPS: 200.0000 mg | ORAL_CAPSULE | Freq: Every day | ORAL | 3 refills | Status: DC

## 2022-11-13 MED ORDER — DIVALPROEX SODIUM ER 500 MG PO TB24: 1500.0000 mg | ORAL_TABLET | Freq: Every day | ORAL | 11 refills | Status: DC

## 2022-11-13 NOTE — Patient Instructions (Addendum)
Rv in 12 months, bone density scan to be repeated .  Take vit d and calcium.

## 2022-11-13 NOTE — Addendum Note (Signed)
Addended by: Melvyn Novas on: 11/13/2022 11:35 AM   Modules accepted: Orders

## 2022-11-13 NOTE — Addendum Note (Signed)
Addended by: Judi Cong on: 11/13/2022 11:57 AM   Modules accepted: Orders
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
## 2022-11-14 LAB — COMPREHENSIVE METABOLIC PANEL
ALT: 23 [IU]/L (ref 0–44)
AST: 17 [IU]/L (ref 0–40)
Albumin: 4.2 g/dL (ref 3.9–4.9)
Alkaline Phosphatase: 101 [IU]/L (ref 44–121)
BUN/Creatinine Ratio: 16 (ref 10–24)
BUN: 13 mg/dL (ref 8–27)
Bilirubin Total: 0.2 mg/dL (ref 0.0–1.2)
CO2: 24 mmol/L (ref 20–29)
Calcium: 9.5 mg/dL (ref 8.6–10.2)
Chloride: 106 mmol/L (ref 96–106)
Creatinine, Ser: 0.83 mg/dL (ref 0.76–1.27)
Globulin, Total: 2.4 g/dL (ref 1.5–4.5)
Glucose: 74 mg/dL (ref 70–99)
Potassium: 4.6 mmol/L (ref 3.5–5.2)
Sodium: 143 mmol/L (ref 134–144)
Total Protein: 6.6 g/dL (ref 6.0–8.5)
eGFR: 95 mL/min/{1.73_m2} (ref >59)

## 2022-11-14 LAB — VALPROIC ACID LEVEL: Valproic Acid Lvl: 61 ug/mL (ref 50–100)

## 2022-11-15 ENCOUNTER — Telehealth: Payer: Self-pay | Admitting: Anesthesiology

## 2022-11-15 ENCOUNTER — Telehealth: Payer: Self-pay | Admitting: Neurology

## 2022-11-15 LAB — PHENYTOIN LEVEL, FREE AND TOTAL
Phenytoin, Free: 1.1 ug/mL (ref 1.0–2.0)
Phenytoin, Serum: 10.5 ug/mL (ref 10.0–20.0)

## 2022-11-15 NOTE — Telephone Encounter (Signed)
-----   Message from Lockwood Dohmeier sent at 11/14/2022  5:56 PM EDT ----- Normal level of valproic acid, considered non-toxic .  Normal CMET

## 2022-11-15 NOTE — Telephone Encounter (Signed)
Called the sister as she is the one listed on the DPR and to LVM. There was no answer LVM advising the sister to come back.   **If she calls back, please advise that Mr. Kubicek's labs came back normal and there was nothing that appeared concerning. Please make aware so that she can pass along to whomever that when the patient was here and while he was getting his labs drawn, there was a singular bed bug that had fallen off in the lab area. May want to pass this along to the group home in case they need to complete a treatment for this.

## 2022-11-15 NOTE — Telephone Encounter (Signed)
Called pt and left voicemail for pt to return call.    **If/when patient returns call, please relay that his labs Valproic Acid and CMET are normal**

## 2022-11-27 NOTE — Telephone Encounter (Signed)
Called Patient LVM to give [place] a call back.

## 2022-12-01 NOTE — Telephone Encounter (Signed)
Called patient and spoke with his sister to informed her of Jeffery [name] lab results. Pt verbalized understanding. Pt had no questions at this time but was encouraged to call back if questions arise.

## 2023-10-24 ENCOUNTER — Ambulatory Visit
Admission: RE | Admit: 2023-10-24 | Discharge: 2023-10-24 | Disposition: A | Discharge status: Home / Self Care | Payer: Medicare (Managed Care) | Source: Ambulatory Visit

## 2023-10-24 ENCOUNTER — Other Ambulatory Visit: Payer: Self-pay

## 2023-10-24 DIAGNOSIS — T148XXA Other injury of unspecified body region, initial encounter: Secondary | ICD-10-CM

## 2023-11-13 ENCOUNTER — Ambulatory Visit (INDEPENDENT_AMBULATORY_CARE_PROVIDER_SITE_OTHER): Payer: Medicare (Managed Care) | Admitting: Neurology

## 2023-11-13 ENCOUNTER — Encounter: Payer: Self-pay | Admitting: Neurology

## 2023-11-13 VITALS — BP 118/72 | HR 65 | Ht 67.0 in | Wt 139.2 lb

## 2023-11-13 DIAGNOSIS — G40909 Epilepsy, unspecified, not intractable, without status epilepticus: Secondary | ICD-10-CM | POA: Diagnosis not present

## 2023-11-13 DIAGNOSIS — R569 Unspecified convulsions: Secondary | ICD-10-CM

## 2023-11-13 DIAGNOSIS — G9341 Metabolic encephalopathy: Secondary | ICD-10-CM

## 2023-11-13 DIAGNOSIS — F70 Mild intellectual disabilities: Secondary | ICD-10-CM | POA: Diagnosis not present

## 2023-11-13 DIAGNOSIS — I5032 Chronic diastolic (congestive) heart failure: Secondary | ICD-10-CM

## 2023-11-13 MED ORDER — DIVALPROEX SODIUM ER 500 MG PO TB24: 1500.0000 mg | ORAL_TABLET | Freq: Every day | ORAL | 3 refills | Status: AC

## 2023-11-13 MED ORDER — DILANTIN 100 MG PO CAPS: 200.0000 mg | ORAL_CAPSULE | Freq: Every day | ORAL | 3 refills | Status: AC

## 2023-11-13 NOTE — Patient Instructions (Addendum)
 ASSESSMENT AND PLAN :    68 y.o. year old male  patient with  mental delay, here with:     1)  long standing mental / cognitive delay,    2) seizure disorder on depakote   1500 mg at night , 24 h release.  Refilled today.     3) dilantin , generic - 200 mg at bedtime.  90 days supplies.     \\CMET, CBC diff, levels of phenytoin  and of valproic  acid.    Had recent seizure activity ( in the last 3 months ) , had seizures of less severity about 10 months ago.    Mail order pharmacy ? I wrote for 90 day supplies.  RV in 12 months with NP        Seizure, Adult A seizure is a sudden burst of abnormal activity in the brain. Seizures usually last from 30 seconds to 2 minutes. There are many types of seizures. And they can cause many different symptoms. What are the causes? Common causes of a seizure include: Fever or infection. Problems that affect the brain. These may include: A brain or head injury. A stroke. A brain tumor. Low levels of blood sugar or salt. Kidney problems or liver problems. Some inherited conditions. These are passed down from parent to child. Problems with a substance, such as: Having a reaction to a drug or a medicine. Stopping the use of a substance all of a sudden. When this causes problems, it's called withdrawal. Disorders that affect how you develop, such as autism spectrum disorder or cerebral palsy. Sometimes, the cause may not be known. Some people who have a seizure never have another one. A person who has repeated seizures over time without a clear cause has a condition called epilepsy. What increases the risk? Having a family history of epilepsy. Having had a tonic-clonic seizure before. This type of seizure causes: The muscles of the whole body to tighten, or contract. Loss of consciousness. Having a head injury or a stroke in the past. Having had too little oxygen at birth. What are the signs or symptoms? The symptoms vary depending on the  type of seizure you have. Symptoms during a seizure Having convulsions. This means shaking with fast, jerky movements of muscles. Stiffness of the body. Breathing problems. Being confused. Staring or not responding to sound or touch. Head nodding, eye blinking, eye twitching, or fast eye movements. Drooling, grunting, or making clicking sounds with your mouth. Losing control of when you pee or poop. Symptoms before a seizure Feeling afraid, worried, or nervous. Feeling like you may vomit. Vertigo. This feels like: You are moving when you're not. Things around you are moving when they're not. Dj vu. This is a feeling of having seen or heard something before. Odd tastes or smells. Changes in how you see. You may see flashing lights or spots. Symptoms after a seizure Being confused. Feeling sleepy. Headache. Sore muscles. How is this diagnosed? A seizure may be diagnosed based on: A description of your symptoms. Video of your seizures can be helpful. Your medical history. A physical exam. Tests, such as: Blood tests. CT scan. MRI. Electroencephalogram, or EEG. This test measures electrical activity in the brain. A test of your spinal fluid. This is called a spinal tap or lumbar puncture. How is this treated? If your seizure stops on its own, you will not need treatment. If your seizure lasts longer than 5 minutes, you'll normally need treatment. This may include: Medicines given through an  IV. Avoiding things, such as medicines, that are known to cause your seizures. Medicines to prevent seizures. These are called antiepileptics. A device to prevent or control seizures. Eating foods that are low in carbohydrates and high in fat (ketogenic diet). Surgery. This is sometimes needed if you keep having seizures. Follow these instructions at home: Medicines Take your medicines only as told by your health care provider. Avoid anything that may keep your medicine from working,  such as alcohol. Activity Follow your provider's advice about driving, swimming, and doing other things that would be dangerous if you had a seizure. Wait until your provider says it's safe for you to do these things. If you live in the [place] ask your local department of motor vehicles [facility]) when you can drive. Get enough rest and sleep. Not getting enough sleep can make seizures more likely to happen. Teaching others  Teach friends and family what to do if you have a seizure. Tell them to: Help you get down to the ground safely. Protect your head and body. Loosen any clothing around your neck. Turn you on your side. This helps keep your airway clear if you vomit. Know whether or not you need emergency care. Stay with you until you are better. Also, tell them what not to do if you have a seizure. Tell them: They should not hold you down. They should not put anything in your mouth. General instructions Avoid anything that has caused you to have seizures. Keep a seizure diary. Write down: What you remember about each seizure. What you think might have caused each seizure. Keep all follow-up visits. Your provider may need to monitor your progress. Contact a health care provider if: You have another seizure or seizures. Call each time you have a seizure. You have a change in how often or when you have seizures. You keep having seizures with treatment. You have symptoms of being sick or having an infection. You are not able to take your medicine. Get help right away if: You have or someone has seen you have: A seizure that lasts longer than 5 minutes. Many seizures in a row and you don't feel better between seizures. A seizure that makes it harder to breathe. A seizure that leaves you unable to speak or use a part of your body. You didn't wake up right away after a seizure. You injure yourself during a seizure. You have confusion or pain right after a seizure. These symptoms may be  an emergency. Call 911 right away. Do not wait to see if the symptoms will go away. Do not drive yourself to the hospital. This information is not intended to replace advice given to you by your health care provider. Make sure you discuss any questions you have with your health care provider. Document Revised: 10/05/2022 Document Reviewed: 02/15/2022 Elsevier Patient Education  2024 [employer].

## 2023-11-13 NOTE — Progress Notes (Signed)
 Provider:  Dedra Gores, MD  Primary Care Physician:  Cloria Annabella CROME, DO [address] [place] [address]     Referring Provider: Ilean Norleen GAILS, Md [address] [place],  [place] [address]          Chief Complaint according to patient   Patient presents with:                HISTORY OF PRESENT ILLNESS:  Jeffery [name] is a 68 y.o. male patient who is here for revisit 11/13/2023 for  regular/ routine visit. .  Participates in social activities, attends a daytime care program.  Last seizure activity : sister corrected the record and stated in  August or September , there was a small seizure at  home and she informed the RN at his day program.  He has had medication levels checked.     Chief concern according to patient 's sister - Last ED visit in March 2025 with UTI which let to confusion and not seizures. :  05/08/22:   Jeffery [name] is a 68 y.o. male with a history of seizures. Returns today for follow-up. He is here with his sister today. He lives with her but goes to [facility] during the day. She reports that he had a seizure last week. She went to wake him up and saw him convulsing in all extremities. Denies any missed doses. Had a UTI a month ago but that was treated and resolved. Remains on Depakote  1500 mg daily and Dilantin  200 mg daily.      10/18/21: Jeffery [name] is a 68 year old male with a history of mental retardation and seizures.  He returns today for follow-up.  He is here today with his sister.  She reports that over the last week he has had a hard time walking and his balance she feels that this is related to his seizure medication.  He also had a urinary tract infection today is his last day of antibiotic.  She has not noticed significant improvement in his gait since he started the antibiotic.  the patient has not had any seizure events in the last 6 months.  Currently on Dilantin  250 mg daily and Depakote  1500 mg daily.  Dilantin  level was checked  that patient was in normal range   11/02/2020 Jeffery [name] is a 68 year old male with a history of mental retardation and seizures.  He returns today for follow-up.  The patient had a breakthrough seizure about a week and a half ago.  His sister states that he did miss a dose of his medication.  He was taken Dilantin  200 mg.  His dose of Dilantin  have been reduced from 250-200 due to increased blood level.  Dilantin  has been increased back to 250 mg daily.  The patient has not had any seizures since.   07/07/20: Jeffery [name] is a 68 year old male with a history of mental retardation and seizures.  He is here today with his sister.  She reports that over the last year he may have had 6 seizures.  She states that he typically presents with shaking and his whole entire body gets stiff.  She does not recall when the last seizure was.  [facility].  Reports that his Dilantin  level was recently increased to 250 mg at bedtime due to low levels.  He remains on Depakote  1500 mg daily.  She returns today for an evaluation.  07/07/19: Jeffery [name] is a 68 year old male with a history of mental retardation and seizures.  He returns today for follow-up.  He is here today with his sister.  He continues to live with her.  He remains on Depakote  and Dilantin .  He is a part of [facility].  Denies any seizure events.  Returns today for an evaluation.   HISTORY 01/01/19:   Jeffery [name] is a 68 year old male with a history of mental retardation and seizures.  He returns today for follow-up.  He is currently taking Depakote  1500 mg daily and Dilantin  250 mg daily.  He is here today with his caregiver.  They deny any seizure events.  He did go to the emergency room in November for altered mental status but work-up was relatively unremarkable.  He lives with his sister and brother-in-law.  His brother-in-law reports that he is able to complete most ADLs independently.  They help him with his medication and food.   He returns today for evaluation    Review of Systems: Out of a complete 14 system review, the patient complains of only the following symptoms, and all other reviewed systems are negative.:   SLEEPINESS ?  How likely are you to doze in the following situations: 0 = not likely, 1 = slight chance, 2 = moderate chance, 3 = high chance  Sitting and Reading? Watching Television? Sitting inactive in a public place (theater or meeting)? Lying down in the afternoon when circumstances permit? Sitting and talking to someone? Sitting quietly after lunch without alcohol? In a car, while stopped for a few minutes in traffic? As a passenger in a car for an hour without a break?  Total =        Social History   Socioeconomic History   Marital status: Single    Spouse name: Not on file   Number of children: 0   Years of education: Not on file   Highest education level: Not on file  Occupational History   Occupation: Disability   Tobacco Use   Smoking status: Former    Current packs/day: 0.50    Average packs/day: 0.5 packs/day for 10.0 years (5.0 ttl pk-yrs)    Types: Cigarettes   Smokeless tobacco: Never   Tobacco comments:    SNEAKS A SMOKE EVERY NOW AND THEN  Vaping Use   Vaping status: Never Used  Substance and Sexual Activity   Alcohol use: No    Comment: none in 15 yrs,past ETOH abuse Sneaks liquor once every few months   Drug use: No   Sexual activity: Never  Other Topics Concern   Not on file  Social History Narrative   Patient is single and lives with his sister    Patient does not have any children.   Patient is right-handed.   Rarely drinks caffeine, avoids at home    Goes to adult day care three days per week      Some caffeine weekly    Social Drivers of Health   Financial Resource Strain: Not on file  Food Insecurity: No Food Insecurity (04/07/2022)   Hunger Vital Sign    Worried About Running Out of Food in the Last Year: Never true    Ran Out of  Food in the Last Year: Never true  Transportation Needs: No Transportation Needs (04/07/2022)   PRAPARE - Administrator, Civil Service (Medical): No    Lack of Transportation (Non-Medical): No  Physical Activity: Not on file  Stress: Not on file  Social Connections: Not on file    Family History  Problem Relation Age of Onset   Cancer Sister        breast   Hypertension Sister    Diabetes Sister    Hypertension Sister    Seizures Neg Hx    Stroke Neg Hx     Past Medical History:  Diagnosis Date   Cancer (HCC)    PROSTATE CANCER--PROSTATE BIOPSY PLANNED EVERY 6 MONTHS--NO TX PLANNED UNLESS THE CANCER STARTS PROGRESSING-   Hypercholesteremia    under control   Mental retardation 03-20-11   Sister states pt. developed retardation symptoms about age 17,? whether drug related. Start having seizures. Hx. ETOH abuse in past. -cognitive issues-can not live alone   Seizures (HCC)    last seizure 10/27/13  grande maul    Past Surgical History:  Procedure Laterality Date   LYMPH NODE DISSECTION Bilateral 07/15/2021   Procedure: PELVIC LYMPH NODE DISSECTION;  Surgeon: Alvaro Hummer, MD;  Location: [facility];  Service: Urology;  Laterality: Bilateral;   PROSTATE BIOPSY  03/27/2011   Procedure: BIOPSY TRANSRECTAL ULTRASONIC PROSTATE (TUBP);  Surgeon: Toribio Neysa Repine, MD;  Location: [facility];  Service: Urology;  Laterality: N/A;  Prostatic Block        PROSTATE BIOPSY  09/27/2011   Procedure: BIOPSY TRANSRECTAL ULTRASONIC PROSTATE (TUBP);  Surgeon: Toribio Neysa Repine, MD;  Location: [facility];  Service: Urology;  Laterality: N/A;        PROSTATE BIOPSY N/A 10/29/2013   Procedure: BIOPSY TRANSRECTAL ULTRASONIC PROSTATE (TUBP);  Surgeon: Hummer Alvaro, MD;  Location: [facility];  Service: Urology;  Laterality: N/A;   ROBOT ASSISTED LAPAROSCOPIC RADICAL PROSTATECTOMY N/A 07/15/2021   Procedure: XI ROBOTIC ASSISTED LAPAROSCOPIC RADICAL PROSTATECTOMY; CIRCUMCISION;  Surgeon:  Alvaro Hummer, MD;  Location: [facility];  Service: Urology;  Laterality: N/A;     Current Outpatient Medications on File Prior to Visit  Medication Sig Dispense Refill   ASPIRIN  PO Take 81 mg by mouth at bedtime.     atorvastatin  (LIPITOR) 20 MG tablet Take 20 mg by mouth at bedtime.     calcium -vitamin D (OSCAL WITH D) 500-5 MG-MCG tablet Take 1 tablet by mouth daily with breakfast. 30 tablet 11   citalopram  (CELEXA ) 20 MG tablet Take 20 mg by mouth at bedtime.     DILANTIN  100 MG ER capsule Take 2 capsules (200 mg total) by mouth at bedtime. 180 capsule 3   divalproex  (DEPAKOTE  ER) 500 MG 24 hr tablet Take 3 tablets (1,500 mg total) by mouth at bedtime. 90 tablet 11   docusate sodium  (COLACE) 100 MG capsule Take 1 capsule (100 mg total) by mouth 2 (two) times daily. (Patient taking differently: Take 100 mg by mouth at bedtime.)     VITAMIN E PO Take 1 tablet by mouth at bedtime.     No current facility-administered medications on file prior to visit.    No Known Allergies   DIAGNOSTIC DATA (LABS, IMAGING, TESTING) - I reviewed patient records, labs, notes, testing and imaging myself where available.  Lab Results  Component Value Date   WBC 4.2 05/08/2022   HGB 14.8 05/08/2022   HCT 46.5 05/08/2022   MCV 97 05/08/2022   PLT 127 (L) 05/08/2022      Component Value Date/Time   NA 143 11/13/2022 1143   K 4.6 11/13/2022 1143   CL 106 11/13/2022 1143   CO2 24 11/13/2022 1143   GLUCOSE 74 11/13/2022 1143  GLUCOSE 84 04/07/2022 1524   BUN 13 11/13/2022 1143   CREATININE 0.83 11/13/2022 1143   CREATININE 0.83 01/27/2016 1515   CALCIUM  9.5 11/13/2022 1143   PROT 6.6 11/13/2022 1143   ALBUMIN 4.2 11/13/2022 1143   AST 17 11/13/2022 1143   ALT 23 11/13/2022 1143   ALKPHOS 101 11/13/2022 1143   BILITOT 0.2 11/13/2022 1143   GFRNONAA >60 04/07/2022 1524   GFRNONAA >89 01/27/2016 1515   GFRAA >60 12/10/2018 1139   GFRAA >89 01/27/2016 1515   Lab Results  Component Value Date    CHOL 230 (H) 08/17/2011   HDL 80 08/17/2011   LDLCALC 137 (H) 08/17/2011   LDLDIRECT 101 04/26/2015   TRIG 66 08/17/2011   CHOLHDL 2.9 08/17/2011   No results found for: HGBA1C No results found for: VITAMINB12 No results found for: TSH  PHYSICAL EXAM:  Vitals:   11/13/23 1024  BP: 118/72  Pulse: 65  SpO2: 98%   No data found. Body mass index is 21.8 kg/m.   Wt Readings from Last 3 Encounters:  11/13/23 139 lb 3.2 oz (63.1 kg)  11/13/22 138 lb 6.4 oz (62.8 kg)  05/08/22 137 lb (62.1 kg)     Ht Readings from Last 3 Encounters:  11/13/23 5' 7 (1.702 m)  11/13/22 5' 7 (1.702 m)  05/08/22 5' 7 (1.702 m)      General: The patient is awake, alert and appears not in acute distress and groomed. Head: Normocephalic, atraumatic.  Neck is supple.  neck circumference:15.5  inches .   Nasal airflow  patent.   Retrognathia is seen.  Dental status:  upper dentures.   Cardiovascular:  Regular rate and cardiac rhythm by pulse, without distended neck veins. Respiratory: no shortness of breath  Skin:  Without evidence of ankle edema, or rash. Trunk: BMI is 22    NEUROLOGIC EXAM: The patient is awake and alert, Nonverbal, he cannot follow simple motor commands unless demonstrated- he then mimicks my  movements.  Cranial nerve :  food tastes good, smell intact, Pupils were equal round reactive to light.  Extraocular movements were full, visual field were full on confrontational test..  Head turning and shoulder shrug  were  symmetric. Motor: The motor testing reveals normal strength of all 4 extremities with symmetric motor tone . No cogwheeling.  Sensory:  unable to follow.  Coordination:  Cerebellar testing reveals abnormal finger-nose-finger movements  bilaterally, almost  hemip ballistic, ataxic, and dysmetric. No tremor.   DTR : attenuated in both knees, no achilles reflex.  Gait and Station:  wide based gait, fragmented turns.    ASSESSMENT AND PLAN :    68 y.o. year old male  patient with  mental delay, here with:    1)  long standing mental / cognitive delay,   2) seizure disorder on depakote   1500 mg at night , 24 h release.  Refilled today.   3) dilantin , generic - 200 mg at bedtime.  90 days supplies.    Had recent seizure activity ( in the last 3 months ) , had seizures of less severity about 10 months ago.   Mail order pharmacy ? I wrote for 90 day supplies.  RV in 12 months with NP      I would like to thank Cloria, Annabella CROME, DO and Cresenzo, John V, Md [address] [place],  [place] [address] for allowing me to meet with this pleasant patient.       The patient  will be seen in follow-up in the sleep clinic at [facility] for discussion of test results, sleep related symptoms and treatment compliance review, further management strategies, etc.   The referring provider will be notified of the test results.   The patient's condition requires frequent monitoring and adjustments in the treatment plan, reflecting the ongoing complexity of care.  This provider is the continuing focal point for all needed services for this condition.  After spending a total time of  35  minutes face to face and time for  history taking, physical and neurologic examination, review of laboratory studies,  personal review of imaging studies, reports and results of other testing and review of referral information / records as far as provided in visit,   Electronically signed by: Dedra Gores, MD 11/13/2023 10:36 AM  [facility] Sleep Board certified by The [employer] of Sleep Medicine and Diplomate of the [employer] of Sleep Medicine. Board certified In Neurology through the ABPN, Fellow of the [employer] of Neu[employer].

## 2023-11-15 ENCOUNTER — Ambulatory Visit: Payer: Self-pay | Admitting: Neurology

## 2023-11-15 LAB — CBC WITH DIFFERENTIAL/PLATELET
Basophils Absolute: 0 x10E3/uL (ref 0.0–0.2)
Basos: 0 %
EOS (ABSOLUTE): 0.1 x10E3/uL (ref 0.0–0.4)
Eos: 3 %
Hematocrit: 46.7 % (ref 37.5–51.0)
Hemoglobin: 15.4 g/dL (ref 13.0–17.7)
Immature Grans (Abs): 0 x10E3/uL (ref 0.0–0.1)
Immature Granulocytes: 0 %
Lymphocytes Absolute: 1.8 x10E3/uL (ref 0.7–3.1)
Lymphs: 40 %
MCH: 32.2 pg (ref 26.6–33.0)
MCHC: 33 g/dL (ref 31.5–35.7)
MCV: 98 fL — ABNORMAL HIGH (ref 79–97)
Monocytes Absolute: 0.4 x10E3/uL (ref 0.1–0.9)
Monocytes: 8 %
Neutrophils Absolute: 2.2 x10E3/uL (ref 1.4–7.0)
Neutrophils: 49 %
Platelets: 187 x10E3/uL (ref 150–450)
RBC: 4.78 x10E6/uL (ref 4.14–5.80)
RDW: 12.5 % (ref 11.6–15.4)
WBC: 4.6 x10E3/uL (ref 3.4–10.8)

## 2023-11-15 LAB — COMPREHENSIVE METABOLIC PANEL WITH GFR
ALT: 17 IU/L (ref 0–44)
AST: 16 IU/L (ref 0–40)
Albumin: 4 g/dL (ref 3.9–4.9)
Alkaline Phosphatase: 95 IU/L (ref 47–123)
BUN/Creatinine Ratio: 18 (ref 10–24)
BUN: 17 mg/dL (ref 8–27)
Bilirubin Total: 0.3 mg/dL (ref 0.0–1.2)
CO2: 24 mmol/L (ref 20–29)
Calcium: 10.5 mg/dL — ABNORMAL HIGH (ref 8.6–10.2)
Chloride: 105 mmol/L (ref 96–106)
Creatinine, Ser: 0.96 mg/dL (ref 0.76–1.27)
Globulin, Total: 3.1 g/dL (ref 1.5–4.5)
Glucose: 96 mg/dL (ref 70–99)
Potassium: 5.6 mmol/L — ABNORMAL HIGH (ref 3.5–5.2)
Sodium: 144 mmol/L (ref 134–144)
Total Protein: 7.1 g/dL (ref 6.0–8.5)
eGFR: 85 mL/min/1.73 (ref >59)

## 2023-11-15 LAB — IRON,TIBC AND FERRITIN PANEL
Ferritin: 259 ng/mL (ref 30–400)
Iron Saturation: 61 % — ABNORMAL HIGH (ref 15–55)
Iron: 165 ug/dL (ref 38–169)
Total Iron Binding Capacity: 269 ug/dL (ref 250–450)
UIBC: 104 ug/dL — ABNORMAL LOW (ref 111–343)

## 2023-11-15 LAB — PHENYTOIN LEVEL, FREE AND TOTAL
Phenytoin, Free: 1 ug/mL (ref 1.0–2.0)
Phenytoin, Serum: 9.3 ug/mL — ABNORMAL LOW (ref 10.0–20.0)

## 2023-11-15 LAB — VALPROIC ACID LEVEL: Valproic Acid Lvl: 68 ug/mL (ref 50–100)

## 2023-11-19 NOTE — Progress Notes (Signed)
 Tried to reach sister at [phone]. Vm box not setup yet.

## 2023-11-20 NOTE — Telephone Encounter (Signed)
 Attempted to contacted pt sister again, VM not set up.

## 2023-11-22 ENCOUNTER — Telehealth: Payer: Self-pay | Admitting: Neurology

## 2023-11-22 NOTE — Telephone Encounter (Signed)
 Appointment details confirmed For next year for [facility]

## 2023-11-27 NOTE — Telephone Encounter (Signed)
-----   Message from Uvalda Dohmeier sent at 11/15/2023  8:52 PM EDT ----- Blood cell count in range with the last 2 years,   high iron saturation, high potassium.    Normal kidney and liver function.   Level of  phenytoin  was just slightly below therapeutic range ( 9.3 < 10) while valproic  acid was in normal range.   No changes in medication, please encourage hydration ( drink water  ! )   ----- Message ----- From: Interface, [facility] Lab Results In Sent: 11/14/2023   7:37 AM EDT To: Dedra Gores, MD

## 2023-11-27 NOTE — Telephone Encounter (Signed)
 Lmvm for pts sister to return call concerning lab results.

## 2024-11-13 ENCOUNTER — Ambulatory Visit: Payer: Medicare (Managed Care) | Admitting: Adult Health
